# Patient Record
Sex: Female | Born: 1945 | Race: White | Hispanic: No | Marital: Single | Smoking: Never smoker
Health system: Southern US, Community
[De-identification: ages and names within clinical notes are randomized; demographics above are authoritative.]

## PROBLEM LIST (undated history)

## (undated) DIAGNOSIS — F329 Major depressive disorder, single episode, unspecified: Secondary | ICD-10-CM

## (undated) DIAGNOSIS — I10 Essential (primary) hypertension: Secondary | ICD-10-CM

## (undated) DIAGNOSIS — F32A Depression, unspecified: Secondary | ICD-10-CM

## (undated) DIAGNOSIS — I639 Cerebral infarction, unspecified: Secondary | ICD-10-CM

## (undated) DIAGNOSIS — E119 Type 2 diabetes mellitus without complications: Secondary | ICD-10-CM

## (undated) HISTORY — DX: Major depressive disorder, single episode, unspecified: F32.9

## (undated) HISTORY — DX: Essential (primary) hypertension: I10

## (undated) HISTORY — PX: UMBILICAL HERNIA REPAIR: SHX196

## (undated) HISTORY — DX: Type 2 diabetes mellitus without complications: E11.9

## (undated) HISTORY — PX: HERNIA REPAIR: SHX51

## (undated) HISTORY — DX: Cerebral infarction, unspecified: I63.9

## (undated) HISTORY — DX: Depression, unspecified: F32.A

---

## 2001-02-26 ENCOUNTER — Encounter: Admission: RE | Admit: 2001-02-26 | Discharge: 2001-02-26 | Payer: Self-pay | Admitting: Family Medicine

## 2001-03-09 ENCOUNTER — Encounter: Admission: RE | Admit: 2001-03-09 | Discharge: 2001-03-09 | Payer: Self-pay | Admitting: Family Medicine

## 2001-03-12 ENCOUNTER — Encounter: Admission: RE | Admit: 2001-03-12 | Discharge: 2001-03-12 | Payer: Self-pay | Admitting: Family Medicine

## 2001-04-10 ENCOUNTER — Encounter: Admission: RE | Admit: 2001-04-10 | Discharge: 2001-04-10 | Payer: Self-pay | Admitting: Family Medicine

## 2001-05-01 ENCOUNTER — Encounter: Admission: RE | Admit: 2001-05-01 | Discharge: 2001-07-30 | Payer: Self-pay | Admitting: *Deleted

## 2001-07-27 ENCOUNTER — Encounter: Admission: RE | Admit: 2001-07-27 | Discharge: 2001-07-27 | Payer: Self-pay | Admitting: Family Medicine

## 2001-08-20 ENCOUNTER — Encounter: Admission: RE | Admit: 2001-08-20 | Discharge: 2001-08-20 | Payer: Self-pay | Admitting: Family Medicine

## 2001-11-28 ENCOUNTER — Encounter: Admission: RE | Admit: 2001-11-28 | Discharge: 2001-11-28 | Payer: Self-pay | Admitting: Family Medicine

## 2002-01-18 ENCOUNTER — Encounter: Admission: RE | Admit: 2002-01-18 | Discharge: 2002-01-18 | Payer: Self-pay | Admitting: Family Medicine

## 2002-02-13 ENCOUNTER — Encounter: Admission: RE | Admit: 2002-02-13 | Discharge: 2002-02-13 | Payer: Self-pay | Admitting: Family Medicine

## 2002-06-13 ENCOUNTER — Encounter: Admission: RE | Admit: 2002-06-13 | Discharge: 2002-06-13 | Payer: Self-pay | Admitting: Family Medicine

## 2002-06-23 ENCOUNTER — Encounter (INDEPENDENT_AMBULATORY_CARE_PROVIDER_SITE_OTHER): Payer: Self-pay | Admitting: *Deleted

## 2003-01-20 ENCOUNTER — Encounter: Admission: RE | Admit: 2003-01-20 | Discharge: 2003-01-20 | Payer: Self-pay | Admitting: Sports Medicine

## 2003-12-24 ENCOUNTER — Emergency Department (HOSPITAL_COMMUNITY): Admission: EM | Admit: 2003-12-24 | Discharge: 2003-12-24 | Payer: Self-pay | Admitting: Family Medicine

## 2003-12-31 ENCOUNTER — Encounter: Admission: RE | Admit: 2003-12-31 | Discharge: 2003-12-31 | Payer: Self-pay | Admitting: Family Medicine

## 2004-01-12 ENCOUNTER — Encounter: Admission: RE | Admit: 2004-01-12 | Discharge: 2004-01-12 | Payer: Self-pay | Admitting: Family Medicine

## 2005-05-02 ENCOUNTER — Ambulatory Visit: Payer: Self-pay

## 2005-08-22 ENCOUNTER — Ambulatory Visit: Payer: Self-pay | Admitting: Sports Medicine

## 2006-07-20 DIAGNOSIS — I10 Essential (primary) hypertension: Secondary | ICD-10-CM

## 2006-07-20 DIAGNOSIS — N951 Menopausal and female climacteric states: Secondary | ICD-10-CM

## 2006-07-20 DIAGNOSIS — K649 Unspecified hemorrhoids: Secondary | ICD-10-CM | POA: Insufficient documentation

## 2006-07-20 DIAGNOSIS — M1990 Unspecified osteoarthritis, unspecified site: Secondary | ICD-10-CM | POA: Insufficient documentation

## 2006-07-20 DIAGNOSIS — N393 Stress incontinence (female) (male): Secondary | ICD-10-CM | POA: Insufficient documentation

## 2006-07-20 DIAGNOSIS — E119 Type 2 diabetes mellitus without complications: Secondary | ICD-10-CM

## 2006-07-20 DIAGNOSIS — K5900 Constipation, unspecified: Secondary | ICD-10-CM | POA: Insufficient documentation

## 2006-07-20 DIAGNOSIS — E7800 Pure hypercholesterolemia, unspecified: Secondary | ICD-10-CM

## 2006-07-20 DIAGNOSIS — E669 Obesity, unspecified: Secondary | ICD-10-CM

## 2006-07-21 ENCOUNTER — Encounter (INDEPENDENT_AMBULATORY_CARE_PROVIDER_SITE_OTHER): Payer: Self-pay | Admitting: *Deleted

## 2011-01-06 ENCOUNTER — Inpatient Hospital Stay (HOSPITAL_COMMUNITY)
Admission: EM | Admit: 2011-01-06 | Discharge: 2011-01-13 | DRG: 064 | Disposition: A | Discharge status: Skilled Nursing Facility | Payer: Medicare Other | Attending: Neurology | Admitting: Neurology

## 2011-01-06 ENCOUNTER — Emergency Department (HOSPITAL_COMMUNITY): Payer: Medicare Other

## 2011-01-06 DIAGNOSIS — J189 Pneumonia, unspecified organism: Secondary | ICD-10-CM | POA: Diagnosis present

## 2011-01-06 DIAGNOSIS — Z6831 Body mass index (BMI) 31.0-31.9, adult: Secondary | ICD-10-CM

## 2011-01-06 DIAGNOSIS — R29810 Facial weakness: Secondary | ICD-10-CM | POA: Diagnosis present

## 2011-01-06 DIAGNOSIS — E669 Obesity, unspecified: Secondary | ICD-10-CM | POA: Diagnosis present

## 2011-01-06 DIAGNOSIS — N390 Urinary tract infection, site not specified: Secondary | ICD-10-CM | POA: Diagnosis present

## 2011-01-06 DIAGNOSIS — I6350 Cerebral infarction due to unspecified occlusion or stenosis of unspecified cerebral artery: Principal | ICD-10-CM | POA: Diagnosis present

## 2011-01-06 DIAGNOSIS — F4321 Adjustment disorder with depressed mood: Secondary | ICD-10-CM | POA: Diagnosis present

## 2011-01-06 DIAGNOSIS — Z79899 Other long term (current) drug therapy: Secondary | ICD-10-CM

## 2011-01-06 DIAGNOSIS — Z7982 Long term (current) use of aspirin: Secondary | ICD-10-CM

## 2011-01-06 DIAGNOSIS — I10 Essential (primary) hypertension: Secondary | ICD-10-CM | POA: Diagnosis present

## 2011-01-06 DIAGNOSIS — R471 Dysarthria and anarthria: Secondary | ICD-10-CM | POA: Diagnosis present

## 2011-01-06 DIAGNOSIS — E785 Hyperlipidemia, unspecified: Secondary | ICD-10-CM | POA: Diagnosis present

## 2011-01-06 DIAGNOSIS — E1149 Type 2 diabetes mellitus with other diabetic neurological complication: Secondary | ICD-10-CM | POA: Diagnosis present

## 2011-01-06 DIAGNOSIS — Z794 Long term (current) use of insulin: Secondary | ICD-10-CM

## 2011-01-06 DIAGNOSIS — B372 Candidiasis of skin and nail: Secondary | ICD-10-CM | POA: Diagnosis present

## 2011-01-06 DIAGNOSIS — E1142 Type 2 diabetes mellitus with diabetic polyneuropathy: Secondary | ICD-10-CM | POA: Diagnosis present

## 2011-01-06 DIAGNOSIS — Z880 Allergy status to penicillin: Secondary | ICD-10-CM

## 2011-01-06 LAB — POCT I-STAT TROPONIN I: Troponin i, poc: 0.01 ng/mL (ref 0.00–0.08)

## 2011-01-07 ENCOUNTER — Inpatient Hospital Stay (HOSPITAL_COMMUNITY): Payer: Medicare Other

## 2011-01-07 DIAGNOSIS — I517 Cardiomegaly: Secondary | ICD-10-CM

## 2011-01-07 LAB — CBC
HCT: 44.3 % (ref 36.0–46.0)
Hemoglobin: 16.2 g/dL — ABNORMAL HIGH (ref 12.0–15.0)
MCH: 30.7 pg (ref 26.0–34.0)
MCHC: 36.6 g/dL — ABNORMAL HIGH (ref 30.0–36.0)
MCV: 84.1 fL (ref 78.0–100.0)
Platelets: 228 10*3/uL (ref 150–400)
RBC: 5.27 MIL/uL — ABNORMAL HIGH (ref 3.87–5.11)
RDW: 12.2 % (ref 11.5–15.5)
WBC: 11 K/uL — ABNORMAL HIGH (ref 4.0–10.5)

## 2011-01-07 LAB — URINALYSIS, ROUTINE W REFLEX MICROSCOPIC
Bilirubin Urine: NEGATIVE
Glucose, UA: NEGATIVE mg/dL
Hgb urine dipstick: NEGATIVE
Ketones, ur: 15 mg/dL — AB
Nitrite: NEGATIVE
Protein, ur: NEGATIVE mg/dL
Specific Gravity, Urine: 1.009 (ref 1.005–1.030)
Urobilinogen, UA: 1 mg/dL (ref 0.0–1.0)
pH: 6.5 (ref 5.0–8.0)

## 2011-01-07 LAB — DIFFERENTIAL
Basophils Absolute: 0 K/uL (ref 0.0–0.1)
Basophils Relative: 0 % (ref 0–1)
Eosinophils Absolute: 0.1 K/uL (ref 0.0–0.7)
Eosinophils Relative: 1 % (ref 0–5)
Lymphocytes Relative: 12 % (ref 12–46)
Lymphs Abs: 1.3 10*3/uL (ref 0.7–4.0)
Monocytes Absolute: 0.6 K/uL (ref 0.1–1.0)
Monocytes Relative: 6 % (ref 3–12)
Neutro Abs: 8.9 10*3/uL — ABNORMAL HIGH (ref 1.7–7.7)
Neutrophils Relative %: 81 % — ABNORMAL HIGH (ref 43–77)

## 2011-01-07 LAB — CK TOTAL AND CKMB (NOT AT ARMC)
CK, MB: 2.3 ng/mL (ref 0.3–4.0)
Relative Index: INVALID (ref 0.0–2.5)
Total CK: 39 U/L (ref 7–177)

## 2011-01-07 LAB — COMPREHENSIVE METABOLIC PANEL
ALT: 10 U/L (ref 0–35)
AST: 12 U/L (ref 0–37)
Albumin: 4.1 g/dL (ref 3.5–5.2)
Alkaline Phosphatase: 131 U/L — ABNORMAL HIGH (ref 39–117)
GFR calc Af Amer: >60 mL/min (ref >60)
Glucose, Bld: 217 mg/dL — ABNORMAL HIGH (ref 70–99)
Sodium: 137 mEq/L (ref 135–145)

## 2011-01-07 LAB — TROPONIN I: Troponin I: <0.3 ng/mL (ref <0.30)

## 2011-01-07 LAB — HEMOGLOBIN A1C
Hgb A1c MFr Bld: 8 % — ABNORMAL HIGH (ref <5.7)
Mean Plasma Glucose: 183 mg/dL — ABNORMAL HIGH (ref <117)

## 2011-01-07 LAB — COMPREHENSIVE METABOLIC PANEL WITH GFR
BUN: 11 mg/dL (ref 6–23)
CO2: 25 meq/L (ref 19–32)
Calcium: 10 mg/dL (ref 8.4–10.5)
Chloride: 100 meq/L (ref 96–112)
Creatinine, Ser: 0.51 mg/dL (ref 0.50–1.10)
GFR calc non Af Amer: >60 mL/min (ref >60)
Potassium: 3.5 meq/L (ref 3.5–5.1)
Total Bilirubin: 0.8 mg/dL (ref 0.3–1.2)
Total Protein: 7.4 g/dL (ref 6.0–8.3)

## 2011-01-07 LAB — LIPID PANEL
Cholesterol: 204 mg/dL — ABNORMAL HIGH (ref 0–200)
HDL: 44 mg/dL (ref >39)
LDL Cholesterol: 140 mg/dL — ABNORMAL HIGH (ref 0–99)
Total CHOL/HDL Ratio: 4.6 ratio
Triglycerides: 98 mg/dL (ref <150)
VLDL: 20 mg/dL (ref 0–40)

## 2011-01-07 LAB — URINE MICROSCOPIC-ADD ON

## 2011-01-07 LAB — PROTIME-INR
INR: 0.99 (ref 0.00–1.49)
Prothrombin Time: 13.3 seconds (ref 11.6–15.2)

## 2011-01-07 LAB — APTT: aPTT: 31 s (ref 24–37)

## 2011-01-07 LAB — ETHANOL: Alcohol, Ethyl (B): <11 mg/dL (ref 0–11)

## 2011-01-07 LAB — GLUCOSE, CAPILLARY: Glucose-Capillary: 245 mg/dL — ABNORMAL HIGH (ref 70–99)

## 2011-01-07 MED ORDER — GADOBENATE DIMEGLUMINE 529 MG/ML IV SOLN
15.0000 mL | Freq: Once | INTRAVENOUS | Status: AC
  Administered 2011-01-07: 15 mL via INTRAVENOUS

## 2011-01-08 LAB — CBC
HCT: 40.5 % (ref 36.0–46.0)
Hemoglobin: 14.3 g/dL (ref 12.0–15.0)
MCH: 30.2 pg (ref 26.0–34.0)
MCHC: 35.3 g/dL (ref 30.0–36.0)
MCV: 85.6 fL (ref 78.0–100.0)

## 2011-01-08 LAB — GLUCOSE, CAPILLARY
Glucose-Capillary: 200 mg/dL — ABNORMAL HIGH (ref 70–99)
Glucose-Capillary: 216 mg/dL — ABNORMAL HIGH (ref 70–99)
Glucose-Capillary: 164 mg/dL — ABNORMAL HIGH (ref 70–99)
Glucose-Capillary: 201 mg/dL — ABNORMAL HIGH (ref 70–99)

## 2011-01-08 LAB — COMPREHENSIVE METABOLIC PANEL
CO2: 26 mEq/L (ref 19–32)
Calcium: 9.3 mg/dL (ref 8.4–10.5)
Creatinine, Ser: 0.56 mg/dL (ref 0.50–1.10)
GFR calc Af Amer: >60 mL/min (ref >60)
GFR calc non Af Amer: >60 mL/min (ref >60)
Glucose, Bld: 188 mg/dL — ABNORMAL HIGH (ref 70–99)

## 2011-01-08 LAB — TSH: TSH: 2.627 u[IU]/mL (ref 0.350–4.500)

## 2011-01-09 ENCOUNTER — Inpatient Hospital Stay (HOSPITAL_COMMUNITY): Payer: Medicare Other

## 2011-01-09 LAB — GLUCOSE, CAPILLARY
Glucose-Capillary: 206 mg/dL — ABNORMAL HIGH (ref 70–99)
Glucose-Capillary: 137 mg/dL — ABNORMAL HIGH (ref 70–99)
Glucose-Capillary: 192 mg/dL — ABNORMAL HIGH (ref 70–99)
Glucose-Capillary: 126 mg/dL — ABNORMAL HIGH (ref 70–99)

## 2011-01-09 NOTE — Consult Note (Signed)
NAME:  Annette Farmer, Annette Farmer NO.:  [id number]  MEDICAL RECORD NO.:  [id number]  LOCATION:  3731                         FACILITY:  [facility]  PHYSICIAN:  Conley Canal, MD      DATE OF BIRTH:  [birth year]  DATE OF CONSULTATION: DATE OF DISCHARGE:                                CONSULTATION   REQUESTING PHYSICIAN:  Pramod P. Pearlean Brownie, MD  PRIMARY CARE PHYSICIAN:  None.  REASON FOR CONSULTATION:  Management of medical problems including diabetes mellitus, hypertension, hyperlipidemia, possible urinary tract infection.  HISTORY OF PRESENT ILLNESS:  I was asked by Dr. 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  The patient was also found to have diabetes with a hemoglobin A1c of 8 and very poorly-controlled blood pressure as well as hyperlipidemia with total cholesterol of 204 and LDL 140.  The patient has not seen a primary care physician in many years.  She attributes it to problems with insurance.  She thus has not been taking any medication.  Her blood pressures have been ranging from 142-176 systolic since admission.  She admits to urinary frequency and a chest x-ray on January 07, 2011, suggested density of the left hemidiaphragm, which could possibly be due to retro diaphragmatic air space opacity.  She denies any other complaints.  No fever, no shortness of breath, no abdominal pain, no polyuria.  PAST MEDICAL HISTORY:  Hypertension.  ALLERGIES:  Thinks she has allergy to PENICILLIN.  SOCIAL HISTORY:  The patient lives by herself.  Denies cigarette smoking.  Occasionally drinks alcohol and denies illicit drugs.  FAMILY HISTORY:  The patient denies history of chronic medical conditions.  CURRENT MEDICATIONS:  Aspirin, clonidine, Lovenox, labetalol, normal saline, Zofran, Senokot, Ambien.  REVIEW OF SYSTEMS:  Unremarkable  except as highlighted in the history of present illness.  PHYSICAL EXAMINATION:  GENERAL:  This is a well looking lady who is not in acute distress. VITALS:  Blood pressure 163/94, heart rate is 77, temperature 97.7, respirations 18, O2 saturation 97% on room air. HEAD, EARS, NOSE, AND THROAT:  Pupils equal, reacting to light. NECK:  No jugular venous distention.  No carotid bruits. RESPIRATORY:  Good air entry bilaterally with some scant basilar rhonchi on the left side. CARDIOVASCULAR:  First and second heart sounds are heard.  No murmurs. Pulse regular. ABDOMEN:  Scaphoid, soft, nontender.  No palpable organomegaly.  Bowel sounds are normal. CNS:  The patient is alert and oriented to person, place, and time.  No overt acute neurological deficits. EXTREMITIES:  No pedal edema.  Peripheral pulses equal.  LABORATORY DATA:  Labs were reviewed.  Significant for WBC 11, hemoglobin 16, hematocrit 44, platelet count 228, neutrophils 81%. Sodium 137, potassium 3.5, BUN 11, creatinine 0.51, alkaline phosphatase 131.  Urinalysis shows negative nitrites and some trace leukocytes. Microscopy shows WBC 3 to 6 with few bacteria.  Cardiac enzymes negative x1.  Lipid panel shows total cholesterol 204, HDL 44, LDL 140, hemoglobin A1c 8.  Chest x-ray and MRI findings discussed above.  EKG shows normal sinus rhythm  with some QT prolongation, no significant ST-T wave changes.  2-D echocardiogram shows EF 55% to 60% with grade 1 diastolic dysfunction.  IMPRESSION:  A 65 year old female who has type 2 diabetes mellitus, hypertension, hyperlipidemia, possibly early urinary tract infection, and some atelectasis versus pneumonia on the left lower lobe who is coming in with a nonhemorrhagic cerebrovascular accident.  RECOMMENDATIONS: 1. Diabetes mellitus type 2.  Since hemoglobin A1c less than 10, we     will place the patient on metformin and sliding scale insulin while     in-house.  Change diet to  diabetic, heart healthy diet. 2. Hypertension.  We would discontinue clonidine patch to give room     for adjustment of other medications.  We will start the patient on     Lopressor as well as Norvasc and adjust accordingly.  Continue     labetalol p.r.n. 3. Hyperlipidemia.  We will start the patient on Zocor. 4. UTI, possible left lower lobe pneumonia.  We will cover with     Levaquin for 7 days. 5. CVA, defer management to Neurology. 6. The patient will need long-term followup with primary care for     health care maintenance issues including things like mammogram and     colonoscopy.  I discussed this with the patient who says that she     has to have Medicare approved, but she says that she applied for     Medicare apparently late for this year.  She may need social work     referral for help with sorting out her Medicare.  Thank you very much for this consult.  We will follow with you during the course of this hospitalization.     Conley Canal, MD     SR/MEDQ  D:  01/07/2011  T:  01/07/2011  Job:  [id number]  cc:   Pramod P. Pearlean Brownie, MD  Electronically Signed by Conley Canal  on 01/09/2011 02:25:08 PM

## 2011-01-10 ENCOUNTER — Inpatient Hospital Stay (HOSPITAL_COMMUNITY): Payer: Medicare Other

## 2011-01-10 DIAGNOSIS — G8110 Spastic hemiplegia affecting unspecified side: Secondary | ICD-10-CM

## 2011-01-10 LAB — URINE CULTURE: Culture  Setup Time: 201208172147

## 2011-01-10 LAB — BASIC METABOLIC PANEL
BUN: 16 mg/dL (ref 6–23)
Chloride: 104 mEq/L (ref 96–112)
GFR calc Af Amer: >60 mL/min (ref >60)
Potassium: 3.4 mEq/L — ABNORMAL LOW (ref 3.5–5.1)
Sodium: 137 mEq/L (ref 135–145)

## 2011-01-10 LAB — GLUCOSE, CAPILLARY
Glucose-Capillary: 147 mg/dL — ABNORMAL HIGH (ref 70–99)
Glucose-Capillary: 129 mg/dL — ABNORMAL HIGH (ref 70–99)
Glucose-Capillary: 141 mg/dL — ABNORMAL HIGH (ref 70–99)
Glucose-Capillary: 200 mg/dL — ABNORMAL HIGH (ref 70–99)

## 2011-01-11 ENCOUNTER — Inpatient Hospital Stay (HOSPITAL_COMMUNITY): Payer: Medicare Other

## 2011-01-11 LAB — BASIC METABOLIC PANEL
CO2: 26 mEq/L (ref 19–32)
Chloride: 104 mEq/L (ref 96–112)
GFR calc Af Amer: >60 mL/min (ref >60)
Potassium: 3.8 mEq/L (ref 3.5–5.1)
Sodium: 138 mEq/L (ref 135–145)

## 2011-01-11 LAB — GLUCOSE, CAPILLARY
Glucose-Capillary: 169 mg/dL — ABNORMAL HIGH (ref 70–99)
Glucose-Capillary: 196 mg/dL — ABNORMAL HIGH (ref 70–99)
Glucose-Capillary: 232 mg/dL — ABNORMAL HIGH (ref 70–99)
Glucose-Capillary: 108 mg/dL — ABNORMAL HIGH (ref 70–99)

## 2011-01-12 LAB — GLUCOSE, CAPILLARY
Glucose-Capillary: 126 mg/dL — ABNORMAL HIGH (ref 70–99)
Glucose-Capillary: 229 mg/dL — ABNORMAL HIGH (ref 70–99)
Glucose-Capillary: 100 mg/dL — ABNORMAL HIGH (ref 70–99)
Glucose-Capillary: 177 mg/dL — ABNORMAL HIGH (ref 70–99)

## 2011-01-12 LAB — BASIC METABOLIC PANEL
BUN: 17 mg/dL (ref 6–23)
CO2: 29 mEq/L (ref 19–32)
Glucose, Bld: 141 mg/dL — ABNORMAL HIGH (ref 70–99)
Potassium: 3.5 mEq/L (ref 3.5–5.1)
Sodium: 137 mEq/L (ref 135–145)

## 2011-01-13 LAB — GLUCOSE, CAPILLARY
Glucose-Capillary: 167 mg/dL — ABNORMAL HIGH (ref 70–99)
Glucose-Capillary: 142 mg/dL — ABNORMAL HIGH (ref 70–99)
Glucose-Capillary: 174 mg/dL — ABNORMAL HIGH (ref 70–99)

## 2011-01-13 NOTE — H&P (Signed)
NAME:  Annette Farmer, Annette Farmer NO.:  [id number]  MEDICAL RECORD NO.:  [id number]  LOCATION:  3731                         FACILITY:  [facility]  PHYSICIAN:  Marlan Palau, M.D.  DATE OF BIRTH:  1945-12-27  DATE OF ADMISSION:  01/06/2011 DATE OF DISCHARGE:                             HISTORY & PHYSICAL   HISTORY OF PRESENT ILLNESS:  Annette Farmer is a 66 year old right-handed white female born [birth year], with a history of hypertension. This patient has no primary care physician and has not been treated at all for her blood pressure issues.  The patient is not on any medications prior to coming in including low-dose aspirin.  This patient went to bed in the evening of January 05, 2011 and awakened around 12 noon on January 06, 2011.  The patient noted that her speech was somewhat slurred when she talked on the telephone.  The patient had no numbness or weakness on the arms or legs.  No gait disturbance or visual field changes.  The patient did report some mild headache.  The patient continued to note some slurred speech and decided to call the EMS this evening.  The patient was brought in as a code stroke which was promptly cancelled given the last seen normal was the day prior.  The CT of the head shows no definite acute changes.  The patient does have some right facial droop and some mild dysarthria.  NIH stroke scale score is 2. The patient is not a TPA candidate secondary to duration of symptoms. The patient is being admitted for evaluation.  Blood pressures are in the 205/110 range.  PAST MEDICAL HISTORY: 1. New onset of left brain subcortical stroke with mild dysarthria and     right facial droop. 2. Tonsillectomy. 3. Umbilical hernia repair. 4. History of fissure in a new repair.  The patient is on no medications.  The patient believes that she is allergic to PENICILLIN because of some nausea.  The patient does not smoke, but drinks alcohol on  occasion.  SOCIAL HISTORY:  The patient is single, never been married, no children, is retired, lives in the [place] area.  FAMILY MEDICAL HISTORY:  Notable that mother died following hernia surgery "never woke up."  Father still living and is in good health. The patient has 1 brother who is in good health, but 1 sister died with breast cancer.  REVIEW OF SYSTEMS:  Notable for no recent fevers or chills.  The patient does note headache.  Denies neck pain, back pain.  Denies shortness of breath, chest pains, abdominal pain and nausea or vomiting.  The patient does have some issues with urinary incontinence, has not had no dizziness or blackouts.  PHYSICAL EXAMINATION:  VITALS:  Blood pressure is 206/126, heart rate 99 and respiratory rate 18.  The patient is afebrile. GENERAL:  This patient is a fairly well developed elderly female who is alert and cooperative at the time of examination. HEENT:  Head is atraumatic.  Eyes, pupils are equal, round and react to light. NECK:  Supple.  No carotid bruits.  No respiratory examination is clear. CARDIOVASCULAR:  Regular rate and rhythm.  No obvious  murmurs or rubs noted. EXTREMITIES:  Without significant edema. NEUROLOGIC:  Cranial nerves as above.  Facial symmetry is not present. The patient has mild right facial droop.  Extraocular movements are full.  Visual fields are full.  Speech is minimally dysarthric.  No aphasia noted.  Motor testing reveals 5/5 strength in all 4, good symmetric motor is noted throughout.  No drift is seen on the arms or legs.  The patient has good finger-nose-finger heel-to-shin.  Gait was not tested.  The patient has good symmetric reflexes.  Toes are neutral bilaterally.  Pinprick is soft to touch.  Vibratory sensation is intact throughout.  LABORATORY VALUES:  Notable for white count 11.0, hemoglobin of 16.2, hematocrit of 44.3, MCV of 84.1, platelets of 228, INR of 0.99, troponin I of 0.01.  Chemistry  panel is pending.  IMPRESSION: 1. Uncontrolled hypertension. 2. Subcortical left brain stroke.  The patient has not received any     medical care for a number of years.  The patient has untreated     hypertension and has severe hypertension currently.  The patient     will be admitted for further evaluation.  PLAN: 1. Labetalol for blood pressure.  We add Catapres patch. 2. MRI of the brain. 3. MRI angiogram of the head and neck. 4. An 2-D echocardiogram. 5. Aspirin therapy.  We will follow patient's clinical course while in-     house.     Marlan Palau, M.D.     CKW/MEDQ  D:  01/07/2011  T:  01/07/2011  Job:  [id number]  Electronically Signed by Thana Farr M.D. on 01/13/2011 09:25:16 AM

## 2011-01-19 NOTE — Discharge Summary (Signed)
NAME:  Annette Farmer, Annette Farmer               ACCOUNT NO.:  [id number]  MEDICAL RECORD NO.:  [id number]  LOCATION:  3023                         FACILITY:  [facility]  PHYSICIAN:  Pramod P. Pearlean Brownie, MD    DATE OF BIRTH:  02-Jun-1945  DATE OF ADMISSION:  01/06/2011 DATE OF DISCHARGE:  01/13/2011                        DISCHARGE SUMMARY - REFERRING   DIAGNOSIS AT TIME OF DISCHARGE: 1. 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 2. Hypertension. 3. Diabetes, new diagnosis. 4. Fungal yeast infection in abdominal folds. 5. Mild obesity with BMI of 31.9. 6. Hyperlipidemia. 7. Urinary tract infection. 8. Pneumonia, treated with antibiotics. 9. Neuropathy secondary to diabetes. 10.Situational depression. 11.Urinary tract infection present on admission, treated with     antibiotics. 12.Right hemeparesis.  MEDICINES AT TIME OF DISCHARGE: 1. Amlodipine 5 mg daily. 2. Aspirin 325 mg daily. 3. Celexa 10 mg daily. 4. Enablex 7.5 mg at bedtime. 5. Gabapentin 300 mg at bedtime. 6. Lantus 20 units at bedtime. 7. Metformin 500 mg b.i.d. with meals. 8. Metoprolol 25 mg b.i.d. 9. Nystatin topically b.i.d. to abdominal folds until healed. 10.Zocor 20 mg daily.  STUDIES PERFORMED: 1. CT of the brain on admission shows acute deep white matter infarct     in the left centrum semiovale.   2. MRI of the brain shows deep white     matter infarct affecting the centrum semiovale. 3. MRA of the brain shows no significant carotid or basilar disease.     No proximal flow reducing lesion. 4. MRA of the neck shows no significant extracranial atherosclerotic     changes. 5. Chest x-ray shows no acute cardiopulmonary process. 6. MRI, follow up shows interval enlargement of nonhemorrhagic infarct     involving the left corona radiata. 7. 2-D echo shows EF of 55%-60% with no obvious source of embolus.     Carotid Doppler shows no extracranial artery stenosis demonstrated. 8. EKG not present in chart  though telemetry strip shows normal sinus     rhythm.  LABORATORY STUDIES:  Chemistry with glucose 141, otherwise normal. Urine culture shows greater than 100,000 colonies of E coli.  TSH 2.627. CBC normal.  Hemoglobin A1c 8.0.  Cholesterol 204, triglycerides 98, HDL 44, LDL 140, troponin I less than 0.30.  Urinalysis with few squamous epithelials, 0-6 white blood cells, 0-2 red blood cells and few bacteria.  Alcohol level less than 11.  HISTORY OF PRESENT ILLNESS:  Annette Farmer is a 65 year old right- handed Caucasian female with history of hypertension.  The patient has no primary care physician and has not been treated for her blood pressure issues.  The patient is not on any medicines prior to coming in.  The patient went to bed evening of January 05, 2011 and awakened around 12 noon on January 06, 2011.  Upon awakening, the patient noted her speech was somewhat slurred when she talked on the telephone.  She had no numbness or weakness in arms and legs.  No gait disturbance or visual field changes.  She did report some mild headache.  The patient decide to call EMS later in the evening after symptoms continued.  The patient was brought in as a code stroke, which was  probably cancelled given that last seen normal was greater than 8 hours previously.  The CT of the brain showed no definite acute changes.  The patient had some right facial droop and mild dysarthria.  NIH stroke scale was two.  The patient was not a TPA candidate secondary to duration of symptoms.  The patient was admitted for further evaluation.  Blood pressure on admission was 205/110.  HOSPITAL COURSE:  MRI confirms stroke in the left centrum semiovale. Infarct was subcortical and felt to be secondary to small-vessel disease.  The patient does have a history of hypertension and was hypertensive on admission.  The patient also had elevated lipids, mild obesity and a hemoglobin A1c of 8.0 consistent with new  onset of diabetes.  The patient was started on amlodipine and metoprolol for her blood pressure as well as Lantus and metformin for her diabetes. Zocor was added for dyslipidemia.  Enablex and Neurontin were also been added for neuropathies related to her diabetes.  In hospital, the patient was evaluated by PT, OT and speech therapy. They recommended home health follow up.  As the patient lived alone prior to admission and has no family support, decision was made that she was unsafe to return home alone at this time.  We will plan short-term skilled nursing home facility placement for ongoing rehab with eventual return home.  Due to the situation, the patient was depressed.  She refused to eat for approximately 24 hours.  The patient was started on Celexa for situational depression.  SNF bed obtained at [facility].  We will discharge there for continuation of care.  The patient had red rash between abdominal folds.  Started on nystatin for Candida treatment.  CONDITION AT TIME OF DISCHARGE:  The patient alert and oriented to person, place and situation.  She has no aphasia.  No dysarthria.  Her eye movements are intact.  Her visual fields are full.  She has right lower facial weakness.  Right upper extremity is 1/5, right lower extremity at 4/5.  Ankle, her right ankle has foot drop.  Strength in left is normal.  DISCHARGE PLAN: 1. Discharged to [facility] for ongoing PT,     OT and speech therapy. 2. Ongoing risk factor control by primary care physician/skilled     nursing home physician. 3. Follow up primary care physician within 1 month. 4. Follow up with Dr. Pearlean Brownie in his office in 2 months. 5. Ongoing diabetes education with follow up nutrition management     recommended.     Annie Main, N.P.   ______________________________ Sunny Schlein. Pearlean Brownie, MD    SB/MEDQ  D:  01/13/2011  T:  01/13/2011  Job:   [id number]  Electronically Signed by Annie Main N.P. on 01/19/2011 02:04:32 PM Electronically Signed by Delia Heady MD on 01/19/2011 09:38:16 PM

## 2011-03-10 ENCOUNTER — Emergency Department (HOSPITAL_COMMUNITY)
Admission: EM | Admit: 2011-03-10 | Discharge: 2011-03-10 | Disposition: A | Discharge status: Home / Self Care | Payer: Self-pay | Attending: Emergency Medicine | Admitting: Emergency Medicine

## 2011-03-10 DIAGNOSIS — K612 Anorectal abscess: Secondary | ICD-10-CM | POA: Insufficient documentation

## 2011-03-10 DIAGNOSIS — Z8673 Personal history of transient ischemic attack (TIA), and cerebral infarction without residual deficits: Secondary | ICD-10-CM | POA: Insufficient documentation

## 2011-03-10 DIAGNOSIS — Z7982 Long term (current) use of aspirin: Secondary | ICD-10-CM | POA: Insufficient documentation

## 2011-03-10 DIAGNOSIS — K6289 Other specified diseases of anus and rectum: Secondary | ICD-10-CM | POA: Insufficient documentation

## 2011-03-10 DIAGNOSIS — Z79899 Other long term (current) drug therapy: Secondary | ICD-10-CM | POA: Insufficient documentation

## 2011-03-10 DIAGNOSIS — I10 Essential (primary) hypertension: Secondary | ICD-10-CM | POA: Insufficient documentation

## 2011-03-10 LAB — CBC
HCT: 40 % (ref 36.0–46.0)
MCHC: 35.3 g/dL (ref 30.0–36.0)
Platelets: 223 10*3/uL (ref 150–400)
RDW: 12.8 % (ref 11.5–15.5)

## 2011-03-10 LAB — DIFFERENTIAL
Basophils Absolute: 0 10*3/uL (ref 0.0–0.1)
Basophils Relative: 0 % (ref 0–1)
Eosinophils Absolute: 0.1 10*3/uL (ref 0.0–0.7)
Eosinophils Relative: 1 % (ref 0–5)
Monocytes Absolute: 0.8 10*3/uL (ref 0.1–1.0)

## 2011-03-10 LAB — GLUCOSE, CAPILLARY: Glucose-Capillary: 161 mg/dL — ABNORMAL HIGH (ref 70–99)

## 2011-03-23 ENCOUNTER — Ambulatory Visit: Payer: Medicare Other | Admitting: Gynecology

## 2011-04-27 ENCOUNTER — Ambulatory Visit: Payer: Self-pay | Admitting: *Deleted

## 2011-04-27 ENCOUNTER — Ambulatory Visit: Payer: Self-pay | Attending: Family Medicine | Admitting: Physical Therapy

## 2011-04-27 DIAGNOSIS — I69998 Other sequelae following unspecified cerebrovascular disease: Secondary | ICD-10-CM | POA: Insufficient documentation

## 2011-04-27 DIAGNOSIS — M6281 Muscle weakness (generalized): Secondary | ICD-10-CM | POA: Insufficient documentation

## 2011-04-27 DIAGNOSIS — Z5189 Encounter for other specified aftercare: Secondary | ICD-10-CM | POA: Insufficient documentation

## 2011-04-27 DIAGNOSIS — R279 Unspecified lack of coordination: Secondary | ICD-10-CM | POA: Insufficient documentation

## 2011-05-03 ENCOUNTER — Ambulatory Visit: Payer: Self-pay | Admitting: Physical Therapy

## 2011-05-03 ENCOUNTER — Ambulatory Visit: Payer: Self-pay | Admitting: *Deleted

## 2011-05-10 ENCOUNTER — Ambulatory Visit: Payer: Self-pay | Admitting: Physical Therapy

## 2011-05-10 ENCOUNTER — Ambulatory Visit: Payer: Self-pay | Admitting: *Deleted

## 2011-05-23 ENCOUNTER — Ambulatory Visit: Payer: Self-pay | Admitting: Physical Therapy

## 2011-05-23 ENCOUNTER — Ambulatory Visit: Payer: Self-pay | Admitting: *Deleted

## 2011-05-31 ENCOUNTER — Ambulatory Visit: Payer: Medicare Other | Admitting: Physical Therapy

## 2011-05-31 ENCOUNTER — Ambulatory Visit: Payer: Medicare Other | Attending: Family Medicine | Admitting: Occupational Therapy

## 2011-05-31 DIAGNOSIS — I69998 Other sequelae following unspecified cerebrovascular disease: Secondary | ICD-10-CM | POA: Diagnosis not present

## 2011-05-31 DIAGNOSIS — M6281 Muscle weakness (generalized): Secondary | ICD-10-CM | POA: Insufficient documentation

## 2011-05-31 DIAGNOSIS — Z5189 Encounter for other specified aftercare: Secondary | ICD-10-CM | POA: Diagnosis not present

## 2011-05-31 DIAGNOSIS — R279 Unspecified lack of coordination: Secondary | ICD-10-CM | POA: Insufficient documentation

## 2011-06-01 ENCOUNTER — Encounter: Payer: Medicare Other | Admitting: *Deleted

## 2011-06-04 ENCOUNTER — Ambulatory Visit (INDEPENDENT_AMBULATORY_CARE_PROVIDER_SITE_OTHER): Payer: Medicare Other

## 2011-06-04 DIAGNOSIS — H8110 Benign paroxysmal vertigo, unspecified ear: Secondary | ICD-10-CM

## 2011-06-04 DIAGNOSIS — E119 Type 2 diabetes mellitus without complications: Secondary | ICD-10-CM | POA: Diagnosis not present

## 2011-06-08 ENCOUNTER — Ambulatory Visit: Payer: Medicare Other | Admitting: Physical Therapy

## 2011-06-08 ENCOUNTER — Encounter: Payer: Medicare Other | Admitting: *Deleted

## 2011-06-15 ENCOUNTER — Ambulatory Visit: Payer: Medicare Other | Admitting: Physical Therapy

## 2011-06-15 ENCOUNTER — Ambulatory Visit: Payer: Medicare Other | Admitting: *Deleted

## 2011-06-21 ENCOUNTER — Telehealth: Payer: Self-pay

## 2011-06-21 NOTE — Telephone Encounter (Signed)
Spoke with pt advised to RTC for eval. Pt understood

## 2011-06-21 NOTE — Telephone Encounter (Signed)
Please ask patient to RTC if dizziness recurs, she should be having a resolution of her symptoms.

## 2011-06-21 NOTE — Telephone Encounter (Signed)
.  Annette Farmer IS OUT OF HER MEDICINE FOR DIZZINESS AND WOULD LIKE TO KNOW IF THE DR WANTED HER TO CONTINUE TAKING IT IF HE DOES, HER PHARMACY IS [facility] [address]

## 2011-06-22 ENCOUNTER — Ambulatory Visit: Payer: Medicare Other | Admitting: Rehabilitative and Restorative Service Providers"

## 2011-06-22 ENCOUNTER — Ambulatory Visit: Payer: Medicare Other | Admitting: Physical Therapy

## 2011-06-22 ENCOUNTER — Ambulatory Visit: Payer: Medicare Other | Admitting: Occupational Therapy

## 2011-06-29 ENCOUNTER — Ambulatory Visit: Payer: Medicare Other | Attending: Family Medicine | Admitting: Occupational Therapy

## 2011-06-29 ENCOUNTER — Ambulatory Visit: Payer: Medicare Other | Admitting: Physical Therapy

## 2011-06-29 DIAGNOSIS — R279 Unspecified lack of coordination: Secondary | ICD-10-CM | POA: Diagnosis not present

## 2011-06-29 DIAGNOSIS — Z5189 Encounter for other specified aftercare: Secondary | ICD-10-CM | POA: Insufficient documentation

## 2011-06-29 DIAGNOSIS — I69998 Other sequelae following unspecified cerebrovascular disease: Secondary | ICD-10-CM | POA: Insufficient documentation

## 2011-06-29 DIAGNOSIS — M6281 Muscle weakness (generalized): Secondary | ICD-10-CM | POA: Insufficient documentation

## 2011-07-06 ENCOUNTER — Ambulatory Visit: Payer: Medicare Other | Admitting: Rehabilitative and Restorative Service Providers"

## 2011-07-06 ENCOUNTER — Ambulatory Visit: Payer: Medicare Other | Admitting: Occupational Therapy

## 2011-07-27 ENCOUNTER — Ambulatory Visit: Payer: Medicare Other | Attending: Family Medicine | Admitting: Occupational Therapy

## 2011-07-27 DIAGNOSIS — R279 Unspecified lack of coordination: Secondary | ICD-10-CM | POA: Insufficient documentation

## 2011-07-27 DIAGNOSIS — M6281 Muscle weakness (generalized): Secondary | ICD-10-CM | POA: Insufficient documentation

## 2011-07-27 DIAGNOSIS — I69998 Other sequelae following unspecified cerebrovascular disease: Secondary | ICD-10-CM | POA: Diagnosis not present

## 2011-07-27 DIAGNOSIS — Z5189 Encounter for other specified aftercare: Secondary | ICD-10-CM | POA: Diagnosis not present

## 2011-08-03 ENCOUNTER — Ambulatory Visit: Payer: Medicare Other | Admitting: Occupational Therapy

## 2011-08-03 DIAGNOSIS — Z5189 Encounter for other specified aftercare: Secondary | ICD-10-CM | POA: Diagnosis not present

## 2011-08-03 DIAGNOSIS — I69998 Other sequelae following unspecified cerebrovascular disease: Secondary | ICD-10-CM | POA: Diagnosis not present

## 2011-08-03 DIAGNOSIS — R279 Unspecified lack of coordination: Secondary | ICD-10-CM | POA: Diagnosis not present

## 2011-08-03 DIAGNOSIS — M6281 Muscle weakness (generalized): Secondary | ICD-10-CM | POA: Diagnosis not present

## 2011-08-10 ENCOUNTER — Encounter: Payer: Medicare Other | Admitting: Occupational Therapy

## 2011-08-24 DIAGNOSIS — D3130 Benign neoplasm of unspecified choroid: Secondary | ICD-10-CM | POA: Diagnosis not present

## 2011-08-24 DIAGNOSIS — E1139 Type 2 diabetes mellitus with other diabetic ophthalmic complication: Secondary | ICD-10-CM | POA: Diagnosis not present

## 2011-08-24 DIAGNOSIS — H2510 Age-related nuclear cataract, unspecified eye: Secondary | ICD-10-CM | POA: Diagnosis not present

## 2011-08-24 DIAGNOSIS — H35369 Drusen (degenerative) of macula, unspecified eye: Secondary | ICD-10-CM | POA: Diagnosis not present

## 2011-09-08 DIAGNOSIS — H2510 Age-related nuclear cataract, unspecified eye: Secondary | ICD-10-CM | POA: Diagnosis not present

## 2011-09-20 ENCOUNTER — Telehealth: Payer: Self-pay

## 2011-09-20 NOTE — Telephone Encounter (Signed)
PT STATES SHE CONTACTED PHARMACY ABOUT RX REFILLS AND THEY TOLD HER THE GENERIC FORM OF HER METFORMIN HAD ALREADY BEEN PICKED UP BUT PT STATES IT HASN'T BEEN. PT STATES THE PHARMACIST TOLD HER TO JUST LOOK AROUND AND THAT SHE PROBABLY MISPLACED IT - SHE FOUND HIM RUDE AND UNHELPFUL WHEN SHE NEEDS HER DIABETIC MEDICINE. ASKS [place] TO SEE WHAT IS GOING ON WITH THIS TO HELP HER PLEASE.   PT USES [facility] [address]   BF

## 2011-09-20 NOTE — Telephone Encounter (Signed)
PT STATES THAT SHE TRIED TO PICK UP HER RX AND [facility] TOLD HER SHE HAD ALREADY PICKED IT UP. SHE SAYS SHE HASN'T, AND SHE NEEDS HER METFORMIN. I CALLED THE PHARMACY AND THEY SAID IT WAS PICKED UP ON 4/27. DON'T KNOW WHAT TO DO AT THIS POINT. PLEASE ADVISE.

## 2011-09-21 MED ORDER — METFORMIN HCL 500 MG PO TABS: 500.0000 mg | ORAL_TABLET | Freq: Two times a day (BID) | ORAL | Status: DC

## 2011-09-21 NOTE — Telephone Encounter (Signed)
Need chart but think we should be able to just refill it.

## 2011-09-21 NOTE — Telephone Encounter (Signed)
Refill sent in

## 2011-09-21 NOTE — Telephone Encounter (Signed)
Spoke with pt and notified rx sent in, pt understood and will pick it up at her pharmacy.

## 2011-09-21 NOTE — Telephone Encounter (Signed)
Chart pulled to PA 

## 2011-09-22 ENCOUNTER — Other Ambulatory Visit: Payer: Self-pay

## 2011-09-28 DIAGNOSIS — H2510 Age-related nuclear cataract, unspecified eye: Secondary | ICD-10-CM | POA: Diagnosis not present

## 2011-09-28 DIAGNOSIS — IMO0002 Reserved for concepts with insufficient information to code with codable children: Secondary | ICD-10-CM | POA: Diagnosis not present

## 2011-12-07 DIAGNOSIS — IMO0002 Reserved for concepts with insufficient information to code with codable children: Secondary | ICD-10-CM | POA: Diagnosis not present

## 2011-12-07 DIAGNOSIS — H2510 Age-related nuclear cataract, unspecified eye: Secondary | ICD-10-CM | POA: Diagnosis not present

## 2011-12-23 ENCOUNTER — Encounter: Payer: Self-pay | Admitting: Family Medicine

## 2012-02-08 ENCOUNTER — Encounter (INDEPENDENT_AMBULATORY_CARE_PROVIDER_SITE_OTHER): Payer: Medicare Other | Admitting: Ophthalmology

## 2012-02-22 ENCOUNTER — Other Ambulatory Visit (INDEPENDENT_AMBULATORY_CARE_PROVIDER_SITE_OTHER): Payer: Medicare Other | Admitting: Ophthalmology

## 2012-02-22 DIAGNOSIS — H3581 Retinal edema: Secondary | ICD-10-CM

## 2012-02-22 DIAGNOSIS — E1165 Type 2 diabetes mellitus with hyperglycemia: Secondary | ICD-10-CM

## 2012-02-22 DIAGNOSIS — E1139 Type 2 diabetes mellitus with other diabetic ophthalmic complication: Secondary | ICD-10-CM | POA: Diagnosis not present

## 2012-03-08 ENCOUNTER — Encounter: Payer: Self-pay | Admitting: Family Medicine

## 2012-03-08 ENCOUNTER — Ambulatory Visit (INDEPENDENT_AMBULATORY_CARE_PROVIDER_SITE_OTHER): Payer: Medicare Other | Admitting: Family Medicine

## 2012-03-08 VITALS — BP 144/66 | HR 57 | Temp 98.2°F | Resp 16 | Ht 61.5 in | Wt 144.0 lb

## 2012-03-08 DIAGNOSIS — I10 Essential (primary) hypertension: Secondary | ICD-10-CM | POA: Diagnosis not present

## 2012-03-08 DIAGNOSIS — H6120 Impacted cerumen, unspecified ear: Secondary | ICD-10-CM | POA: Diagnosis not present

## 2012-03-08 DIAGNOSIS — R42 Dizziness and giddiness: Secondary | ICD-10-CM

## 2012-03-08 DIAGNOSIS — G589 Mononeuropathy, unspecified: Secondary | ICD-10-CM | POA: Diagnosis not present

## 2012-03-08 DIAGNOSIS — E119 Type 2 diabetes mellitus without complications: Secondary | ICD-10-CM

## 2012-03-08 DIAGNOSIS — G629 Polyneuropathy, unspecified: Secondary | ICD-10-CM

## 2012-03-08 LAB — CBC
HCT: 39.2 % (ref 36.0–46.0)
Hemoglobin: 13.7 g/dL (ref 12.0–15.0)
MCH: 30.4 pg (ref 26.0–34.0)
MCHC: 34.9 g/dL (ref 30.0–36.0)
MCV: 86.9 fL (ref 78.0–100.0)
Platelets: 247 10*3/uL (ref 150–400)
RBC: 4.51 MIL/uL (ref 3.87–5.11)
RDW: 13.8 % (ref 11.5–15.5)
WBC: 7.6 10*3/uL (ref 4.0–10.5)

## 2012-03-08 LAB — COMPREHENSIVE METABOLIC PANEL
ALT: <8 U/L (ref 0–35)
AST: 10 U/L (ref 0–37)
Albumin: 4.3 g/dL (ref 3.5–5.2)
Alkaline Phosphatase: 74 U/L (ref 39–117)
BUN: 19 mg/dL (ref 6–23)
CO2: 23 mEq/L (ref 19–32)
Calcium: 9.8 mg/dL (ref 8.4–10.5)
Chloride: 106 mEq/L (ref 96–112)
Creat: 0.74 mg/dL (ref 0.50–1.10)
Glucose, Bld: 116 mg/dL — ABNORMAL HIGH (ref 70–99)
Potassium: 4.2 mEq/L (ref 3.5–5.3)
Sodium: 140 mEq/L (ref 135–145)
Total Bilirubin: 0.7 mg/dL (ref 0.3–1.2)
Total Protein: 6.3 g/dL (ref 6.0–8.3)

## 2012-03-08 LAB — POCT GLYCOSYLATED HEMOGLOBIN (HGB A1C): Hemoglobin A1C: 5.3

## 2012-03-08 LAB — GLUCOSE, POCT (MANUAL RESULT ENTRY): POC Glucose: 116 mg/dl — AB (ref 70–99)

## 2012-03-08 MED ORDER — METOPROLOL TARTRATE 25 MG PO TABS: 25.0000 mg | ORAL_TABLET | Freq: Two times a day (BID) | ORAL | Status: DC

## 2012-03-08 MED ORDER — AMLODIPINE BESYLATE 5 MG PO TABS: 5.0000 mg | ORAL_TABLET | Freq: Every day | ORAL | Status: DC

## 2012-03-08 MED ORDER — GABAPENTIN 300 MG PO CAPS: 300.0000 mg | ORAL_CAPSULE | Freq: Three times a day (TID) | ORAL | Status: DC

## 2012-03-08 MED ORDER — METFORMIN HCL 500 MG PO TABS: 500.0000 mg | ORAL_TABLET | Freq: Every day | ORAL | Status: DC

## 2012-03-08 MED ORDER — CITALOPRAM HYDROBROMIDE 10 MG PO TABS: 10.0000 mg | ORAL_TABLET | Freq: Every day | ORAL | Status: DC

## 2012-03-08 NOTE — Progress Notes (Signed)
@[email or web address] @  Patient ID: SABA GOMM MRN: [id number], DOB: 1946-01-23, 66 y.o. Date of Encounter: 03/08/2012, 12:39 PM  Primary Physician: User Centricity, MD  Chief Complaint: Diabetes follow up  HPI: 66 y.o. year old female with history below presents for follow up of diabetes mellitus. Doing well. No issues or complaints. Taking medications daily without adverse effects. No polydipsia, polyphagia, polyuria, or nocturia.  Blood sugars at home:  100-110 Diet consists of: cereal, apples, cauliflower, pears, grapes, crackers. Has cat at home  Last A1C:  Over 6 months ago  Eye MD:  Dr. Lonia Skinner  (recent cataract OS and retinal hemorrhage) DDS:  Needs referral Influenza vaccine: Pneumococcal vaccine: Last CPE:   3 weeks of dizziness (vertigo), worse at night when lying down, no present today at the visit.  Mild right sided hearing loss. No past medical history on file.   Home Meds: Prior to Admission medications   Medication Sig Start Date End Date Taking? Authorizing Provider  amLODipine (NORVASC) 5 MG tablet Take 5 mg by mouth daily.   Yes Historical Provider, MD  aspirin 325 MG tablet Take 325 mg by mouth daily.   Yes Historical Provider, MD  citalopram (CELEXA) 10 MG tablet Take 10 mg by mouth daily.   Yes Historical Provider, MD  gabapentin (NEURONTIN) 300 MG capsule Take 300 mg by mouth 3 (three) times daily.   Yes Historical Provider, MD  metFORMIN (GLUCOPHAGE) 500 MG tablet Take 1 tablet (500 mg total) by mouth 2 (two) times daily with a meal. 09/21/11 09/20/12 Yes Ryan M Dunn, PA-C  metoprolol tartrate (LOPRESSOR) 25 MG tablet Take 25 mg by mouth 2 (two) times daily.   Yes Historical Provider, MD    Allergies:  Allergies  Allergen Reactions  . Penicillins Nausea Only    History   Social History  . Marital Status: Single    Spouse Name: N/A    Number of Children: N/A  . Years of Education: N/A   Occupational History  . Not on file.   Social History Main  Topics  . Smoking status: Never Smoker   . Smokeless tobacco: Not on file  . Alcohol Use: Not on file  . Drug Use: Not on file  . Sexually Active: Not on file   Other Topics Concern  . Not on file   Social History Narrative  . No narrative on file     Review of Systems: Constitutional: negative for chills, fever, night sweats, weight changes, or fatigue  HEENT: negative for vision changes, hearing loss, congestion, rhinorrhea, or epistaxis Cardiovascular: negative for chest pain, palpitations, diaphoresis, DOE, orthopnea, or edema Respiratory: negative for hemoptysis, wheezing, shortness of breath, dyspnea, or cough Abdominal: negative for abdominal pain, nausea, vomiting, diarrhea, or constipation Dermatological: negative for erythema, or wounds  Multiple insect bites on lower legs Neurologic: negative for headache, dizziness, or syncope Renal:  Negative for polyuria, polydipsia, or dysuria All other systems reviewed and are otherwise negative with the exception to those above and in the HPI.   Physical Exam: Blood pressure 144/66, pulse 57, temperature 98.2 F (36.8 C), temperature source Oral, resp. rate 16, height 5' 1.5" (1.562 m), weight 144 lb (65.318 kg), SpO2 97.00%., Body mass index is 26.77 kg/(m^2). General: Well developed, well nourished, in no acute distress. Head: Normocephalic, atraumatic, eyes without discharge, sclera non-icteric, nares are without discharge. Bilateral auditory canals clear, TM's are without perforation, pearly grey and translucent with reflective cone of light bilaterally. Oral cavity moist, posterior pharynx  without exudate, erythema, peritonsillar abscess, or post nasal drip.  Neck: Supple. No thyromegaly. Full ROM. No lymphadenopathy. Lungs: Clear bilaterally to auscultation without wheezes, rales, or rhonchi. Breathing is unlabored. Heart: RRR with S1 S2. No murmurs, rubs, or gallops appreciated. Abdomen: Soft, non-tender, non-distended with  normoactive bowel sounds. No hepatosplenomegaly. No rebound/guarding. No obvious abdominal masses. Msk:  Strength and tone normal for age. Extremities/Skin: Warm and dry. No clubbing or cyanosis. No edema. No  wounds, or suspicious lesions. Monofilament exam unremarkable bilaterally. Many bug bites both lower extremities. Neuro: Alert and oriented X 3. Moves all extremities spontaneously. Gait is normal. CNII-XII grossly in tact. Psych:  Responds to questions appropriately with a normal affect.   Right ear lavaged by Dr. Elbert Ewings:  Clean, no trauma.  Hearing improved.  ASSESSMENT AND PLAN:  66 y.o. year old female with diabetes, cerumen impaction, S/P CVA, dizziness  1. Type 2 diabetes mellitus  POCT glucose (manual entry), POCT glycosylated hemoglobin (Hb A1C), metFORMIN (GLUCOPHAGE) 500 MG tablet, Comprehensive metabolic panel, CBC  2. Hypertension  metoprolol tartrate (LOPRESSOR) 25 MG tablet, amLODipine (NORVASC) 5 MG tablet, CBC  3. Neuropathy  citalopram (CELEXA) 10 MG tablet, gabapentin (NEURONTIN) 300 MG capsule, CBC   If dizziness persists, will consider meclizine at hs only.  I am hoping the cerumen impaction was the cause.  Patient has had the dizziness before, but this is different   -  Signed, Elvina Sidle, MD 03/08/2012 12:39 PM

## 2012-03-09 ENCOUNTER — Telehealth: Payer: Self-pay

## 2012-03-09 NOTE — Telephone Encounter (Signed)
Dr.Lauenstein Pt states that she did not wake up dizzy, pt also would like to discuss getting a handicap placard.  Best# [phone]

## 2012-03-10 NOTE — Telephone Encounter (Signed)
Please let patient know that I will complete form for handicap sticker for car.  (Just drop the form with me)

## 2012-03-10 NOTE — Telephone Encounter (Signed)
Pt notified that handicapped placecard is ready in pickup drawer

## 2012-03-15 ENCOUNTER — Other Ambulatory Visit: Payer: Self-pay | Admitting: Family Medicine

## 2012-04-08 ENCOUNTER — Ambulatory Visit (INDEPENDENT_AMBULATORY_CARE_PROVIDER_SITE_OTHER): Payer: Medicare Other | Admitting: Family Medicine

## 2012-04-08 VITALS — BP 110/70 | HR 56 | Temp 97.7°F | Resp 16 | Ht 62.0 in | Wt 146.6 lb

## 2012-04-08 DIAGNOSIS — R42 Dizziness and giddiness: Secondary | ICD-10-CM

## 2012-04-08 DIAGNOSIS — I498 Other specified cardiac arrhythmias: Secondary | ICD-10-CM | POA: Diagnosis not present

## 2012-04-08 DIAGNOSIS — Z8673 Personal history of transient ischemic attack (TIA), and cerebral infarction without residual deficits: Secondary | ICD-10-CM

## 2012-04-08 DIAGNOSIS — E119 Type 2 diabetes mellitus without complications: Secondary | ICD-10-CM | POA: Diagnosis not present

## 2012-04-08 DIAGNOSIS — R531 Weakness: Secondary | ICD-10-CM

## 2012-04-08 DIAGNOSIS — R5381 Other malaise: Secondary | ICD-10-CM | POA: Diagnosis not present

## 2012-04-08 DIAGNOSIS — R001 Bradycardia, unspecified: Secondary | ICD-10-CM

## 2012-04-08 DIAGNOSIS — R5383 Other fatigue: Secondary | ICD-10-CM | POA: Diagnosis not present

## 2012-04-08 DIAGNOSIS — I10 Essential (primary) hypertension: Secondary | ICD-10-CM

## 2012-04-08 LAB — COMPREHENSIVE METABOLIC PANEL
ALT: 8 U/L (ref 0–35)
CO2: 30 mEq/L (ref 19–32)
Calcium: 9.7 mg/dL (ref 8.4–10.5)
Chloride: 104 mEq/L (ref 96–112)
Sodium: 141 mEq/L (ref 135–145)
Total Bilirubin: 0.7 mg/dL (ref 0.3–1.2)
Total Protein: 6.5 g/dL (ref 6.0–8.3)

## 2012-04-08 LAB — POCT CBC
Granulocyte percent: 71.3 %G (ref 37–80)
MID (cbc): 0.4 (ref 0–0.9)
POC Granulocyte: 5.3 (ref 2–6.9)
POC LYMPH PERCENT: 22.8 %L (ref 10–50)
Platelet Count, POC: 251 10*3/uL (ref 142–424)
RDW, POC: 13.7 %

## 2012-04-08 LAB — POCT UA - MICROSCOPIC ONLY

## 2012-04-08 LAB — POCT URINALYSIS DIPSTICK: Bilirubin, UA: NEGATIVE

## 2012-04-08 MED ORDER — METOPROLOL TARTRATE 25 MG PO TABS: ORAL_TABLET | ORAL | Status: DC

## 2012-04-08 NOTE — Progress Notes (Signed)
[address]   [place], [place]  [address]   [phone]  Subjective:    Patient ID: Annette Farmer, female    DOB: [birth year], 66 y.o.   MRN: [id number]  HPIThis 66 y.o. female presents for evaluation of dizziness.  Suffering with dizziness while lying down; only occurs while turning to the L.  Took some friends to [facility]; the following afternoon upon awakening, developed nausea and small vomitus x 2.  Has been dizzy since then.  Onset 1.5 weeks ago. Similar symptoms one month ago; dizziness at night.  Felt horrible for two days with onset 1.5 weeks ago.  Has been weak and dizzy since then.  No dizziness during the day but has been suffering with weakness during the day.  While lying in bed, if turns to L, dizziness occurs.  Lasts a few minutes.  Usually turn back to R, then dizziness stops.  Felt so weak leaving church, had to get someone to help her to her car.  No headache; will get mild neck soreness.  No vision changes; no diplopia; has surgery 11/2011 and 12/2011 for cataracts.  No hearing loss in past two weeks with dizziness; history of hearing loss from previous CVA but stable.  No new tinnitus.  No new paresthesias; no focal weakness.  Sugars running 135-141.  No chest pain, palpitations, shortness of breath, leg swelling.  No fever/chills/sweats.  No recent medication changes other than decreasing Metformin to 1/2 dose.  Eating and drinking well; does not drink a lot due to decreased thirst.     Review of Systems  Constitutional: Negative for fever, chills, diaphoresis and fatigue.  HENT: Positive for rhinorrhea and tinnitus. Negative for hearing loss, ear pain, congestion, sore throat, sneezing, trouble swallowing, voice change, postnasal drip and sinus pressure.   Respiratory: Negative for cough, shortness of breath and wheezing.   Cardiovascular: Negative for chest pain, palpitations and leg swelling.  Gastrointestinal: Positive for nausea and vomiting. Negative for abdominal  pain and diarrhea.  Skin: Negative for rash.  Neurological: Positive for dizziness. Negative for tremors, seizures, syncope, facial asymmetry, speech difficulty, weakness, light-headedness, numbness and headaches.  Psychiatric/Behavioral: Negative for confusion.    No past medical history on file.  No past surgical history on file.  Prior to Admission medications   Medication Sig Start Date End Date Taking? Authorizing Provider  amLODipine (NORVASC) 5 MG tablet Take 1 tablet (5 mg total) by mouth daily. 03/08/12  Yes Elvina Sidle, MD  aspirin 325 MG tablet Take 325 mg by mouth daily.   Yes Historical Provider, MD  citalopram (CELEXA) 10 MG tablet Take 1 tablet (10 mg total) by mouth daily. 03/08/12  Yes Elvina Sidle, MD  gabapentin (NEURONTIN) 300 MG capsule Take 1 capsule (300 mg total) by mouth 3 (three) times daily. 03/08/12  Yes Elvina Sidle, MD  Glucose Blood (BLOOD GLUCOSE TEST STRIPS) STRP USE AS DIRECTED (True Balance Test Strips) 03/15/12  Yes Heather M Marte, PA-C  metFORMIN (GLUCOPHAGE) 500 MG tablet Take 1 tablet (500 mg total) by mouth daily at 10 pm. 03/08/12 03/08/13 Yes Elvina Sidle, MD  metoprolol tartrate (LOPRESSOR) 25 MG tablet Take 1 tablet (25 mg total) by mouth 2 (two) times daily. 03/08/12  Yes Elvina Sidle, MD    Allergies  Allergen Reactions  . Penicillins Nausea Only    History   Social History  . Marital Status: Single    Spouse Name: N/A    Number of Children: N/A  .  Years of Education: N/A   Occupational History  . Not on file.   Social History Main Topics  . Smoking status: Never Smoker   . Smokeless tobacco: Not on file  . Alcohol Use: Not on file  . Drug Use: Not on file  . Sexually Active: Not on file   Other Topics Concern  . Not on file   Social History Narrative  . No narrative on file    No family history on file.     Objective:   Physical Exam  Nursing note and vitals reviewed. Constitutional: She is  oriented to person, place, and time. She appears well-developed and well-nourished. No distress.  HENT:  Head: Normocephalic and atraumatic.  Right Ear: External ear normal.  Left Ear: External ear normal.  Nose: Nose normal.  Mouth/Throat: Oropharynx is clear and moist.  Eyes: Conjunctivae normal are normal. Pupils are equal, round, and reactive to light.  Neck: Normal range of motion. Neck supple. No thyromegaly present.  Cardiovascular: Normal rate, regular rhythm, normal heart sounds and intact distal pulses.  Exam reveals no gallop and no friction rub.   No murmur heard. Pulmonary/Chest: Effort normal and breath sounds normal. She has no wheezes. She has no rales.  Abdominal: Soft. Bowel sounds are normal. There is no tenderness. There is no rebound and no guarding.  Lymphadenopathy:    She has no cervical adenopathy.  Neurological: She is alert and oriented to person, place, and time. No cranial nerve deficit. She exhibits normal muscle tone. She displays a negative Romberg sign. Coordination normal.       Gait slowed.  Dizziness with position changes (supine to sitting and sitting to supine).  Skin: She is not diaphoretic.       +excoriations B ankles with petechia diffusely.  Psychiatric: She has a normal mood and affect. Her behavior is normal. Judgment and thought content normal.    Results for orders placed in visit on 04/08/12  POCT CBC      Component Value Range   WBC 7.4  4.6 - 10.2 K/uL   Lymph, poc 1.7  0.6 - 3.4   POC LYMPH PERCENT 22.8  10 - 50 %L   MID (cbc) 0.4  0 - 0.9   POC MID % 5.9  0 - 12 %M   POC Granulocyte 5.3  2 - 6.9   Granulocyte percent 71.3  37 - 80 %G   RBC 4.88  4.04 - 5.48 M/uL   Hemoglobin 14.2  12.2 - 16.2 g/dL   HCT, POC 16.1  09.6 - 47.9 %   MCV 94.4  80 - 97 fL   MCH, POC 29.1  27 - 31.2 pg   MCHC 30.8 (*) 31.8 - 35.4 g/dL   RDW, POC 04.5     Platelet Count, POC 251  142 - 424 K/uL   MPV 9.1  0 - 99.8 fL  POCT UA - MICROSCOPIC ONLY       Component Value Range   WBC, Ur, HPF, POC 4-8     RBC, urine, microscopic 0-1     Bacteria, U Microscopic 4+     Mucus, UA neg     Epithelial cells, urine per micros 0-1     Crystals, Ur, HPF, POC neg     Casts, Ur, LPF, POC neg     Yeast, UA neg    POCT URINALYSIS DIPSTICK      Component Value Range   Color, UA yellow  Clarity, UA clear     Glucose, UA neg     Bilirubin, UA neg     Ketones, UA trace     Spec Grav, UA 1.025     Blood, UA neg     pH, UA 5.5     Protein, UA neg     Urobilinogen, UA 1.0     Nitrite, UA positive     Leukocytes, UA small (1+)     EKG:  SINUS BRADYCARDIA AT 44; NORMAL PR AND QRS INTERVALS.        Assessment & Plan:   1. Dizziness  EKG 12-Lead, POCT CBC, POCT UA - Microscopic Only, POCT urinalysis dipstick, Comprehensive metabolic panel  2. Weakness    3. Type II or unspecified type diabetes mellitus without mention of complication, not stated as uncontrolled    4. Essential hypertension, benign    5. H/O: CVA (cerebrovascular accident)      1. Dizziness:  New.  Occurs with position changes and with turning head; consistent with vertigo.Supportive care with changing positions slowly; most dizziness occurring at night which makes falling less likely.    Obtain labs.  Bradycardic on EKG; advised to 1/2 B blocker dose.  Follow-up in two weeks with PCP. 2.  Weakness:  New.  Associated with dizziness.  Obtain labs. May very well be secondary to bradycarida; to 1/2 B blocker dose.  Follow-up in two weeks. 3. DMII: stable; obtain labs. 4. HTN: stable.   5. CVA hx: stable.  No focal neurological findings on exam.    Meds ordered this encounter  Medications  . DISCONTD: metoprolol tartrate (LOPRESSOR) 25 MG tablet    Sig: 1/2 TABLET TWO TIMES DAILY    Dispense:  60 tablet    Refill:  11

## 2012-04-08 NOTE — Patient Instructions (Addendum)
1. Dizziness  EKG 12-Lead, POCT CBC, POCT UA - Microscopic Only, POCT urinalysis dipstick, Comprehensive metabolic panel  2. Weakness    3. Type II or unspecified type diabetes mellitus without mention of complication, not stated as uncontrolled    4. Essential hypertension, benign    5. H/O: CVA (cerebrovascular accident)       1.  DECREASE METOPROLOL 25MG  TO 1/2 TABLET TWICE DAILY UNTIL NEXT APPOINTMENT WITH DR.  Milus Glazier. 2.  INCREASE FLUID INTAKE DAILY. 3.  FOLLOW-UP WITH DR. Milus Glazier IN UPCOMING 1-2 WEEKS.

## 2012-04-10 LAB — URINE CULTURE

## 2012-04-16 ENCOUNTER — Telehealth: Payer: Self-pay

## 2012-04-16 NOTE — Telephone Encounter (Signed)
Pt states was seen 1 1/2 weeks ago, and wants to know when she should follow up

## 2012-04-17 NOTE — Telephone Encounter (Signed)
Recommended pt follow-up with Dr. Milus Glazier her PCP in 2-3 weeks at appointment center.  I will route message to scheduling pool to schedule pt an appointment.  KMS

## 2012-04-17 NOTE — Telephone Encounter (Signed)
Spoke w/pt to clarify she is to f/up in 2-3 wks after OV w/Dr Katrinka Blazing. Transferred pt to 104 to try to set up appt.

## 2012-04-17 NOTE — Telephone Encounter (Signed)
Spoke with pt scheduled appt for 04/23/12 with dr Milus Glazier

## 2012-04-23 ENCOUNTER — Encounter: Payer: Self-pay | Admitting: Family Medicine

## 2012-04-23 ENCOUNTER — Ambulatory Visit (INDEPENDENT_AMBULATORY_CARE_PROVIDER_SITE_OTHER): Payer: Medicare Other | Admitting: Family Medicine

## 2012-04-23 VITALS — BP 130/82 | HR 66 | Temp 97.6°F | Resp 16 | Ht 61.0 in | Wt 150.6 lb

## 2012-04-23 DIAGNOSIS — R7309 Other abnormal glucose: Secondary | ICD-10-CM

## 2012-04-23 DIAGNOSIS — R739 Hyperglycemia, unspecified: Secondary | ICD-10-CM

## 2012-04-23 DIAGNOSIS — G609 Hereditary and idiopathic neuropathy, unspecified: Secondary | ICD-10-CM | POA: Diagnosis not present

## 2012-04-23 DIAGNOSIS — R42 Dizziness and giddiness: Secondary | ICD-10-CM

## 2012-04-23 DIAGNOSIS — Z23 Encounter for immunization: Secondary | ICD-10-CM | POA: Diagnosis not present

## 2012-04-23 DIAGNOSIS — G629 Polyneuropathy, unspecified: Secondary | ICD-10-CM

## 2012-04-23 DIAGNOSIS — I69351 Hemiplegia and hemiparesis following cerebral infarction affecting right dominant side: Secondary | ICD-10-CM

## 2012-04-23 MED ORDER — GABAPENTIN 300 MG PO CAPS: 300.0000 mg | ORAL_CAPSULE | Freq: Every day | ORAL | Status: DC

## 2012-04-23 NOTE — Progress Notes (Signed)
66 yo woman with positional dizziness, made worse when looking up or turning her head to the left.  Her symptoms are better than last visit.  She has had no further vomiting.  No neck pain, no fever Right foot continues to burn, particularly at night Some weakness persists in right hand from the past stroke.  Objective: Results for orders placed in visit on 04/08/12  POCT CBC      Component Value Range   WBC 7.4  4.6 - 10.2 K/uL   Lymph, poc 1.7  0.6 - 3.4   POC LYMPH PERCENT 22.8  10 - 50 %L   MID (cbc) 0.4  0 - 0.9   POC MID % 5.9  0 - 12 %M   POC Granulocyte 5.3  2 - 6.9   Granulocyte percent 71.3  37 - 80 %G   RBC 4.88  4.04 - 5.48 M/uL   Hemoglobin 14.2  12.2 - 16.2 g/dL   HCT, POC 16.1  09.6 - 47.9 %   MCV 94.4  80 - 97 fL   MCH, POC 29.1  27 - 31.2 pg   MCHC 30.8 (*) 31.8 - 35.4 g/dL   RDW, POC 04.5     Platelet Count, POC 251  142 - 424 K/uL   MPV 9.1  0 - 99.8 fL  POCT UA - MICROSCOPIC ONLY      Component Value Range   WBC, Ur, HPF, POC 4-8     RBC, urine, microscopic 0-1     Bacteria, U Microscopic 4+     Mucus, UA neg     Epithelial cells, urine per micros 0-1     Crystals, Ur, HPF, POC neg     Casts, Ur, LPF, POC neg     Yeast, UA neg    POCT URINALYSIS DIPSTICK      Component Value Range   Color, UA yellow     Clarity, UA clear     Glucose, UA neg     Bilirubin, UA neg     Ketones, UA trace     Spec Grav, UA 1.025     Blood, UA neg     pH, UA 5.5     Protein, UA neg     Urobilinogen, UA 1.0     Nitrite, UA positive     Leukocytes, UA small (1+)    COMPREHENSIVE METABOLIC PANEL      Component Value Range   Sodium 141  135 - 145 mEq/L   Potassium 4.2  3.5 - 5.3 mEq/L   Chloride 104  96 - 112 mEq/L   CO2 30  19 - 32 mEq/L   Glucose, Bld 100 (*) 70 - 99 mg/dL   BUN 19  6 - 23 mg/dL   Creat 4.09  8.11 - 9.14 mg/dL   Total Bilirubin 0.7  0.3 - 1.2 mg/dL   Alkaline Phosphatase 72  39 - 117 U/L   AST 11  0 - 37 U/L   ALT 8  0 - 35 U/L   Total  Protein 6.5  6.0 - 8.3 g/dL   Albumin 4.5  3.5 - 5.2 g/dL   Calcium 9.7  8.4 - 78.2 mg/dL  URINE CULTURE      Component Value Range   Culture ESCHERICHIA COLI     Colony Count >=100,000 COLONIES/ML     Organism ID, Bacteria ESCHERICHIA COLI     Last Hgb A1C was 5.3  Chest:  Clear Heart:  Regular,  I/VI systolic murmur Neck:  No bruit or thyromegaly, no adenopathy Skin:  No rashes Ext: good peripheral pulses, stiff right hand grip.  Assessment:   1. Peripheral neuropathy post stroke 2. Dizziness which may be vertebrobasilar

## 2012-06-14 ENCOUNTER — Ambulatory Visit (INDEPENDENT_AMBULATORY_CARE_PROVIDER_SITE_OTHER): Payer: Medicare Other | Admitting: Family Medicine

## 2012-06-14 ENCOUNTER — Encounter: Payer: Self-pay | Admitting: Family Medicine

## 2012-06-14 VITALS — BP 126/71 | HR 53 | Temp 97.7°F | Resp 18 | Ht 61.5 in | Wt 153.0 lb

## 2012-06-14 DIAGNOSIS — I10 Essential (primary) hypertension: Secondary | ICD-10-CM | POA: Diagnosis not present

## 2012-06-14 DIAGNOSIS — R42 Dizziness and giddiness: Secondary | ICD-10-CM | POA: Diagnosis not present

## 2012-06-14 MED ORDER — METOPROLOL TARTRATE 25 MG PO TABS: 12.5000 mg | ORAL_TABLET | Freq: Every day | ORAL | Status: DC

## 2012-06-14 NOTE — Patient Instructions (Signed)
Reduce the metoprolol to 1/2 tablet ONCE daily now  Come back mid-March

## 2012-06-14 NOTE — Progress Notes (Signed)
67 yo woman with dizzy spells when turning head to the left or getting up quickly.  She fell last Saturday night spontaneously.  Onset of dizziness 2-3 months.  It has improved.  Vomiting has lessened.  Objective:  Slowed speech, good eye contact.  Moving arms equally. Neck: supple, no bruit, good carotid pulse Chest:  Clear Heart: regular, no murmur  No edema

## 2012-06-25 ENCOUNTER — Ambulatory Visit (INDEPENDENT_AMBULATORY_CARE_PROVIDER_SITE_OTHER): Payer: Medicare Other | Admitting: Ophthalmology

## 2012-06-25 DIAGNOSIS — I10 Essential (primary) hypertension: Secondary | ICD-10-CM

## 2012-06-25 DIAGNOSIS — H2700 Aphakia, unspecified eye: Secondary | ICD-10-CM

## 2012-06-25 DIAGNOSIS — E11319 Type 2 diabetes mellitus with unspecified diabetic retinopathy without macular edema: Secondary | ICD-10-CM

## 2012-06-25 DIAGNOSIS — D3130 Benign neoplasm of unspecified choroid: Secondary | ICD-10-CM

## 2012-06-25 DIAGNOSIS — H43819 Vitreous degeneration, unspecified eye: Secondary | ICD-10-CM

## 2012-06-25 DIAGNOSIS — E1165 Type 2 diabetes mellitus with hyperglycemia: Secondary | ICD-10-CM

## 2012-06-25 DIAGNOSIS — H35039 Hypertensive retinopathy, unspecified eye: Secondary | ICD-10-CM

## 2012-07-02 NOTE — Progress Notes (Signed)
Reviewed and agree.

## 2012-07-28 ENCOUNTER — Telehealth: Payer: Self-pay

## 2012-07-28 NOTE — Telephone Encounter (Signed)
DR. L - PT WANTS TO KNOW IF SHE SHOULD CONTINUE TAKING CELEXA [phone]

## 2012-07-30 NOTE — Telephone Encounter (Signed)
Does the celexa help? Called voice mail not set up yet.

## 2012-07-30 NOTE — Telephone Encounter (Signed)
She states she ran out of this several days ago. She states she wants to let you know she does not want to take these anymore.

## 2012-08-09 ENCOUNTER — Ambulatory Visit (INDEPENDENT_AMBULATORY_CARE_PROVIDER_SITE_OTHER): Payer: Medicare Other | Admitting: Family Medicine

## 2012-08-09 ENCOUNTER — Encounter: Payer: Self-pay | Admitting: Family Medicine

## 2012-08-09 VITALS — BP 122/80 | HR 73 | Temp 97.9°F | Resp 16 | Ht 61.5 in | Wt 153.2 lb

## 2012-08-09 DIAGNOSIS — E1149 Type 2 diabetes mellitus with other diabetic neurological complication: Secondary | ICD-10-CM | POA: Diagnosis not present

## 2012-08-09 DIAGNOSIS — I10 Essential (primary) hypertension: Secondary | ICD-10-CM | POA: Diagnosis not present

## 2012-08-09 DIAGNOSIS — H01009 Unspecified blepharitis unspecified eye, unspecified eyelid: Secondary | ICD-10-CM

## 2012-08-09 LAB — POCT GLYCOSYLATED HEMOGLOBIN (HGB A1C): Hemoglobin A1C: 5.8

## 2012-08-09 MED ORDER — TOBRAMYCIN 0.3 % OP SOLN: 1.0000 [drp] | Freq: Three times a day (TID) | OPHTHALMIC | Status: DC

## 2012-08-09 NOTE — Progress Notes (Signed)
67 yo hypertensive patient with right eye grittiness x 1 month.   Dizziness is better Not checking BP Blood sugars below 150, checked once a week on Fridays.  Objective:  NAD, alert and engaging Speech is slow and deliberate Right hemiparesis persists. Eyes:  PERLA, good fundi, mild lower lid margin exudate Chest:  Clear Heart:  Reg, no murmur Feet:  No ulcers or rash Results for orders placed in visit on 08/09/12  POCT GLYCOSYLATED HEMOGLOBIN (HGB A1C)      Result Value Range   Hemoglobin A1C 5.8       Assessment:  Stable BP and diabetes, mild blepharitis.  Plan:  Blepharitis, unspecified laterality - Plan: tobramycin (TOBREX) 0.3 % ophthalmic solution  Unspecified essential hypertension  Type II or unspecified type diabetes mellitus with neurological manifestations, not stated as uncontrolled(250.60) - Plan: POCT glycosylated hemoglobin (Hb A1C)  Recheck 3 months

## 2012-08-11 ENCOUNTER — Telehealth: Payer: Self-pay | Admitting: Family Medicine

## 2012-08-11 NOTE — Telephone Encounter (Signed)
Fyi.  Pt called to let you know that her eyes are better after using the eye drops

## 2012-08-13 NOTE — Telephone Encounter (Signed)
Dr.Lauenstein, Pt states that her right eye is still scratchy and would like to know if you would like for her to continue using the eye drops that were prescribed.FYI (Left eye is doing fine)  Best [phone]

## 2012-08-14 ENCOUNTER — Other Ambulatory Visit: Payer: Self-pay | Admitting: Family Medicine

## 2012-08-14 DIAGNOSIS — H01003 Unspecified blepharitis right eye, unspecified eyelid: Secondary | ICD-10-CM

## 2012-08-14 MED ORDER — PREDNISOLONE ACETATE 1 % OP SUSP: 1.0000 [drp] | Freq: Three times a day (TID) | OPHTHALMIC | Status: DC

## 2012-08-14 NOTE — Telephone Encounter (Signed)
pred forte called in.  Let me know if not better in 48 hours

## 2012-12-24 ENCOUNTER — Ambulatory Visit (INDEPENDENT_AMBULATORY_CARE_PROVIDER_SITE_OTHER): Payer: Medicare Other | Admitting: Ophthalmology

## 2013-03-18 ENCOUNTER — Other Ambulatory Visit: Payer: Self-pay | Admitting: Family Medicine

## 2013-03-19 ENCOUNTER — Other Ambulatory Visit: Payer: Self-pay

## 2013-05-11 ENCOUNTER — Other Ambulatory Visit: Payer: Self-pay | Admitting: Family Medicine

## 2013-05-13 ENCOUNTER — Other Ambulatory Visit: Payer: Self-pay | Admitting: Family Medicine

## 2013-06-05 ENCOUNTER — Encounter: Payer: Self-pay | Admitting: Family Medicine

## 2013-06-05 ENCOUNTER — Ambulatory Visit (INDEPENDENT_AMBULATORY_CARE_PROVIDER_SITE_OTHER): Payer: Self-pay | Admitting: Ophthalmology

## 2013-06-05 ENCOUNTER — Ambulatory Visit (INDEPENDENT_AMBULATORY_CARE_PROVIDER_SITE_OTHER): Payer: Medicare Other | Admitting: Family Medicine

## 2013-06-05 VITALS — BP 155/94 | HR 84 | Temp 98.3°F | Resp 18 | Ht 61.5 in | Wt 163.0 lb

## 2013-06-05 DIAGNOSIS — I10 Essential (primary) hypertension: Secondary | ICD-10-CM

## 2013-06-05 DIAGNOSIS — R7309 Other abnormal glucose: Secondary | ICD-10-CM | POA: Diagnosis not present

## 2013-06-05 DIAGNOSIS — Z23 Encounter for immunization: Secondary | ICD-10-CM | POA: Diagnosis not present

## 2013-06-05 DIAGNOSIS — Z8673 Personal history of transient ischemic attack (TIA), and cerebral infarction without residual deficits: Secondary | ICD-10-CM | POA: Diagnosis not present

## 2013-06-05 DIAGNOSIS — R739 Hyperglycemia, unspecified: Secondary | ICD-10-CM

## 2013-06-05 LAB — CBC WITH DIFFERENTIAL/PLATELET
Basophils Absolute: 0 10*3/uL (ref 0.0–0.1)
Basophils Relative: 0 % (ref 0–1)
Eosinophils Absolute: 0.1 10*3/uL (ref 0.0–0.7)
Eosinophils Relative: 2 % (ref 0–5)
HCT: 41.5 % (ref 36.0–46.0)
Hemoglobin: 14.4 g/dL (ref 12.0–15.0)
Lymphocytes Relative: 26 % (ref 12–46)
Lymphs Abs: 1.9 10*3/uL (ref 0.7–4.0)
MCH: 29.8 pg (ref 26.0–34.0)
MCHC: 34.7 g/dL (ref 30.0–36.0)
MCV: 85.9 fL (ref 78.0–100.0)
Monocytes Absolute: 0.4 10*3/uL (ref 0.1–1.0)
Monocytes Relative: 5 % (ref 3–12)
Neutro Abs: 4.8 10*3/uL (ref 1.7–7.7)
Neutrophils Relative %: 67 % (ref 43–77)
Platelets: 230 10*3/uL (ref 150–400)
RBC: 4.83 MIL/uL (ref 3.87–5.11)
RDW: 14.2 % (ref 11.5–15.5)
WBC: 7.2 10*3/uL (ref 4.0–10.5)

## 2013-06-05 LAB — COMPREHENSIVE METABOLIC PANEL
ALT: <8 U/L (ref 0–35)
AST: 11 U/L (ref 0–37)
Albumin: 4 g/dL (ref 3.5–5.2)
Alkaline Phosphatase: 92 U/L (ref 39–117)
BUN: 24 mg/dL — ABNORMAL HIGH (ref 6–23)
CO2: 24 mEq/L (ref 19–32)
Calcium: 9.2 mg/dL (ref 8.4–10.5)
Chloride: 107 mEq/L (ref 96–112)
Creat: 0.98 mg/dL (ref 0.50–1.10)
Glucose, Bld: 113 mg/dL — ABNORMAL HIGH (ref 70–99)
Potassium: 4.1 mEq/L (ref 3.5–5.3)
Sodium: 142 mEq/L (ref 135–145)
Total Bilirubin: 0.8 mg/dL (ref 0.3–1.2)
Total Protein: 6.6 g/dL (ref 6.0–8.3)

## 2013-06-05 LAB — POCT GLYCOSYLATED HEMOGLOBIN (HGB A1C): Hemoglobin A1C: 6

## 2013-06-05 MED ORDER — BLOOD GLUCOSE TEST VI STRP: 1.0000 "application " | ORAL_STRIP | Freq: Every morning | Status: DC

## 2013-06-05 MED ORDER — AMLODIPINE BESYLATE 5 MG PO TABS: ORAL_TABLET | ORAL | Status: DC

## 2013-06-05 MED ORDER — METOPROLOL SUCCINATE ER 25 MG PO TB24: 25.0000 mg | ORAL_TABLET | Freq: Every day | ORAL | Status: DC

## 2013-06-05 MED ORDER — GABAPENTIN 300 MG PO CAPS: ORAL_CAPSULE | ORAL | Status: DC

## 2013-06-05 NOTE — Patient Instructions (Signed)

## 2013-06-05 NOTE — Progress Notes (Signed)
Patient ID: Annette Farmer MRN: [id number], DOB: [birth year], 68 y.o. Date of Encounter: 06/05/2013, 10:33 AM  Primary Physician: User Centricity, MD  Chief Complaint: HTN  HPI: 68 y.o. year old female with history below presents for hypertension follow up.  She suffered a CVA in the remote past and has some residual right sided weakness and uses a cane.  Cannot get up from floor.  She spends her days watching TV and once a week has a prayer meeting with friends and goes to church  No CP, HA, visual changes, or focal deficits.   Past Medical History  Diagnosis Date  . Diabetes mellitus without complication   . Hypertension   . Depression   . Stroke      Home Meds: Prior to Admission medications   Medication Sig Start Date End Date Taking? Authorizing Provider  amLODipine (NORVASC) 5 MG tablet One daily 06/05/13  Yes Robyn Haber, MD  aspirin 325 MG tablet Take 325 mg by mouth daily.   Yes Historical Provider, MD  gabapentin (NEURONTIN) 300 MG capsule One qhs 06/05/13  Yes Robyn Haber, MD  Glucose Blood (BLOOD GLUCOSE TEST STRIPS) STRP Apply 1 application topically every morning. 06/05/13  Yes Robyn Haber, MD  metoprolol succinate (TOPROL-XL) 25 MG 24 hr tablet Take 1 tablet (25 mg total) by mouth daily. 06/05/13   Robyn Haber, MD    Allergies:  Allergies  Allergen Reactions  . Penicillins Nausea Only    History   Social History  . Marital Status: Single    Spouse Name: N/A    Number of Children: N/A  . Years of Education: N/A   Occupational History  . Not on file.   Social History Main Topics  . Smoking status: Never Smoker   . Smokeless tobacco: Not on file  . Alcohol Use: Not on file  . Drug Use: Not on file  . Sexual Activity: Not on file   Other Topics Concern  . Not on file   Social History Narrative  . No narrative on file     No family history on file.  Review of Systems: Constitutional: negative for chills, fever, night sweats,  weight changes, or fatigue  HEENT: negative for vision changes, hearing loss, congestion, rhinorrhea, ST, epistaxis, or sinus pressure Cardiovascular: negative for chest pain, palpitations, or DOE Respiratory: negative for hemoptysis, wheezing, shortness of breath, or cough Abdominal: negative for abdominal pain, nausea, vomiting, diarrhea, or constipation Dermatological: negative for rash Neurologic: negative for headache, dizziness, or syncope All other systems reviewed and are otherwise negative with the exception to those above and in the HPI.   Physical Exam: Blood pressure 155/94, pulse 84, temperature 98.3 F (36.8 C), resp. rate 18, height 5' 1.5" (1.562 m), weight 163 lb (73.936 kg), SpO2 100.00%., Body mass index is 30.3 kg/(m^2). General: Well developed, well nourished, in no acute distress. Head: Normocephalic, atraumatic, eyes without discharge, sclera non-icteric, nares are without discharge. Bilateral auditory canals clear, TM's are without perforation, pearly grey and translucent with reflective cone of light bilaterally. Oral cavity moist, posterior pharynx without exudate, erythema, peritonsillar abscess, or post nasal drip.  Neck: Supple. No thyromegaly. Full ROM. No lymphadenopathy. No carotid bruits. Lungs: Clear bilaterally to auscultation without wheezes, rales, or rhonchi. Breathing is unlabored. Heart: RRR with S1 S2. No murmurs, rubs, or gallops appreciated.  Abdomen: Soft, non-tender, non-distended with normoactive bowel sounds. No hepatosplenomegaly. No rebound/guarding. No obvious abdominal masses. Msk:  Strength and tone normal for age. Extremities/Skin:  Warm and dry. No clubbing or cyanosis. No edema. No rashes or suspicious lesions. Distal pulses 2+ and equal bilaterally. Neuro: Alert and oriented X 3. Moves all extremities spontaneously. Gait is normal. CNII-XII grossly in tact. DTR 2+, cerebellar function intact. Rhomberg unsteady.  Has some right decrease in  grasp Psych:  Responds to questions appropriately with a normal affect.  Patient had episode of incontinence and could not provide urine specimen Labs:  CMP pending  ASSESSMENT AND PLAN:  68 y.o. year old female with Hyperglycemia - Plan: Glucose Blood (BLOOD GLUCOSE TEST STRIPS) STRP, POCT urinalysis dipstick  Unspecified essential hypertension - Plan: amLODipine (NORVASC) 5 MG tablet, metoprolol succinate (TOPROL-XL) 25 MG 24 hr tablet, Comprehensive metabolic panel, POCT urinalysis dipstick, POCT CBC  H/O: CVA (cerebrovascular accident) - Plan: gabapentin (NEURONTIN) 300 MG capsule   Signed, Robyn Haber, MD 06/05/2013 10:33 AM

## 2013-06-12 ENCOUNTER — Telehealth: Payer: Self-pay

## 2013-06-12 NOTE — Telephone Encounter (Signed)
Wants to know if she is to cut the metoprolol succinate (TOPROL-XL) 25 MG 24 hr tablet In half   [phone]

## 2013-06-13 NOTE — Telephone Encounter (Signed)
lmom to take 1 tab once a day

## 2013-06-13 NOTE — Telephone Encounter (Signed)
No need to split in half.  Take just one once a day

## 2013-06-13 NOTE — Telephone Encounter (Signed)
Dr Joseph Art, I dont see in your notes that she had to cut half. Please advise.

## 2013-06-14 NOTE — Telephone Encounter (Signed)
Pt got message and CB because she was confused since she has been cutting med in half and she doesn't remember Dr L telling her at [place] to take the whole tablet. I advised her that we did send her question to Dr L, and he advised her to take the whole tablet. I told pt that since her BP was high at OV, the whole tablet may control her BP better. Advised pt that she may have some slight dizziness for a few days but if she experiences severe dizziness or if dizziness continues past a few days to call us. Pt agreed.

## 2013-08-09 ENCOUNTER — Other Ambulatory Visit: Payer: Self-pay | Admitting: Family Medicine

## 2013-11-11 ENCOUNTER — Other Ambulatory Visit: Payer: Self-pay | Admitting: Physician Assistant

## 2014-06-22 ENCOUNTER — Ambulatory Visit (INDEPENDENT_AMBULATORY_CARE_PROVIDER_SITE_OTHER): Payer: Medicare Other | Admitting: Family Medicine

## 2014-06-22 VITALS — BP 126/82 | HR 69 | Temp 98.2°F | Resp 20 | Ht 61.0 in | Wt 172.8 lb

## 2014-06-22 DIAGNOSIS — E119 Type 2 diabetes mellitus without complications: Secondary | ICD-10-CM

## 2014-06-22 DIAGNOSIS — I10 Essential (primary) hypertension: Secondary | ICD-10-CM

## 2014-06-22 DIAGNOSIS — Z23 Encounter for immunization: Secondary | ICD-10-CM

## 2014-06-22 LAB — POCT GLYCOSYLATED HEMOGLOBIN (HGB A1C): Hemoglobin A1C: 6.2

## 2014-06-22 MED ORDER — PNEUMOCOCCAL 13-VAL CONJ VACC IM SUSP: 0.5000 mL | INTRAMUSCULAR | Status: DC

## 2014-06-22 NOTE — Progress Notes (Addendum)
° °  Subjective:    Patient ID: Annette Farmer, female    DOB: 08-06-1945, 69 y.o.   MRN: [id number] This chart was scribed for Robyn Haber, MD by Zola Button, Medical Scribe. This patient was seen in Room 9 and the patient's care was started at 2:37 PM.   HPI HPI Comments: Annette Farmer is a 69 y.o. female with a hx of  DM and obesity who presents to the [facility] for a medication refill. She states she was diagnosed with DM when she had a CVA in 2012. Patient states she has not checked her blood sugar in 1 month because she left it at someone's house and he/she has had difficulty mailing it back to her. She states it has been several years since she last got her eyes checked. She also has not had a mammogram in several years, since her retirement. Patient has several bug bites on her ankles that she states does not itch.   Patient states she has been managing by herself and drives herself around.  Review of Systems     Objective:   Physical Exam CONSTITUTIONAL: Well developed/well nourished HEAD: Normocephalic/atraumatic EYES: EOM/PERRL ENMT: Mucous membranes moist NECK: supple no meningeal signs SPINE: entire spine nontender CV: S1/S2 noted, no murmurs/rubs/gallops noted LUNGS: Lungs are clear to auscultation bilaterally, no apparent distress ABDOMEN: soft, nontender, no rebound or guarding GU: no cva tenderness NEURO: Pt is awake/alert, moves all extremitiesx4. Normal monofilament test. EXTREMITIES: Weak pulses in feet.  SKIN: warm. Multiple red papules around ankles. Small blister on left second toe, dorsally. PSYCH: no abnormalities of mood noted  Results for orders placed or performed in visit on 06/22/14  POCT glycosylated hemoglobin (Hb A1C)  Result Value Ref Range   Hemoglobin A1C 6.2         Assessment & Plan:   This chart was scribed in my presence and reviewed by me personally.    ICD-9-CM ICD-10-CM   1. Diabetes type 2, controlled  250.00 E11.9 Comprehensive metabolic panel     Lipid panel     Microalbumin, urine     POCT glycosylated hemoglobin (Hb A1C)     pneumococcal 13-valent conjugate vaccine (PREVNAR 13) injection 0.5 mL  2. Essential hypertension 401.9 I10    Where unable to get a urine test because patient is incontinent, wears diapers, and does not know when she is going to go to the bathroom.  Signed, Robyn Haber, MD

## 2014-06-22 NOTE — Patient Instructions (Signed)
Your diabetes is well controlled     .Health Maintenance Adopting a healthy lifestyle and getting preventive care can go a long way to promote health and wellness. Talk with your health care provider about what schedule of regular examinations is right for you. This is a good chance for you to check in with your provider about disease prevention and staying healthy. In between checkups, there are plenty of things you can do on your own. Experts have done a lot of research about which lifestyle changes and preventive measures are most likely to keep you healthy. Ask your health care provider for more information. WEIGHT AND DIET  Eat a healthy diet  Be sure to include plenty of vegetables, fruits, low-fat dairy products, and lean protein.  Do not eat a lot of foods high in solid fats, added sugars, or salt.  Get regular exercise. This is one of the most important things you can do for your health.  Most adults should exercise for at least 150 minutes each week. The exercise should increase your heart rate and make you sweat (moderate-intensity exercise).  Most adults should also do strengthening exercises at least twice a week. This is in addition to the moderate-intensity exercise.  Maintain a healthy weight  Body mass index (BMI) is a measurement that can be used to identify possible weight problems. It estimates body fat based on height and weight. Your health care provider can help determine your BMI and help you achieve or maintain a healthy weight.  For females 1 years of age and older:   A BMI below 18.5 is considered underweight.  A BMI of 18.5 to 24.9 is normal.  A BMI of 25 to 29.9 is considered overweight.  A BMI of 30 and above is considered obese.  Watch levels of cholesterol and blood lipids  You should start having your blood tested for lipids and cholesterol at 71 years of age, then have this test every 5 years.  You may need to have your cholesterol levels  checked more often if:  Your lipid or cholesterol levels are high.  You are older than 71 years of age.  You are at high risk for heart disease.  CANCER SCREENING   Lung Cancer  Lung cancer screening is recommended for adults 23-35 years old who are at high risk for lung cancer because of a history of smoking.  A yearly low-dose CT scan of the lungs is recommended for people who:  Currently smoke.  Have quit within the past 15 years.  Have at least a 30-pack-year history of smoking. A pack year is smoking an average of one pack of cigarettes a day for 1 year.  Yearly screening should continue until it has been 15 years since you quit.  Yearly screening should stop if you develop a health problem that would prevent you from having lung cancer treatment.  Breast Cancer  Practice breast self-awareness. This means understanding how your breasts normally appear and feel.  It also means doing regular breast self-exams. Let your health care provider know about any changes, no matter how small.  If you are in your 20s or 30s, you should have a clinical breast exam (CBE) by a health care provider every 1-3 years as part of a regular health exam.  If you are 63 or older, have a CBE every year. Also consider having a breast X-ray (mammogram) every year.  If you have a family history of breast cancer, talk to your health  care provider about genetic screening.  If you are at high risk for breast cancer, talk to your health care provider about having an MRI and a mammogram every year.  Breast cancer gene (BRCA) assessment is recommended for women who have family members with BRCA-related cancers. BRCA-related cancers include:  Breast.  Ovarian.  Tubal.  Peritoneal cancers.  Results of the assessment will determine the need for genetic counseling and BRCA1 and BRCA2 testing. Cervical Cancer Routine pelvic examinations to screen for cervical cancer are no longer recommended for  nonpregnant women who are considered low risk for cancer of the pelvic organs (ovaries, uterus, and vagina) and who do not have symptoms. A pelvic examination may be necessary if you have symptoms including those associated with pelvic infections. Ask your health care provider if a screening pelvic exam is right for you.   The Pap test is the screening test for cervical cancer for women who are considered at risk.  If you had a hysterectomy for a problem that was not cancer or a condition that could lead to cancer, then you no longer need Pap tests.  If you are older than 65 years, and you have had normal Pap tests for the past 10 years, you no longer need to have Pap tests.  If you have had past treatment for cervical cancer or a condition that could lead to cancer, you need Pap tests and screening for cancer for at least 20 years after your treatment.  If you no longer get a Pap test, assess your risk factors if they change (such as having a new sexual partner). This can affect whether you should start being screened again.  Some women have medical problems that increase their chance of getting cervical cancer. If this is the case for you, your health care provider may recommend more frequent screening and Pap tests.  The human papillomavirus (HPV) test is another test that may be used for cervical cancer screening. The HPV test looks for the virus that can cause cell changes in the cervix. The cells collected during the Pap test can be tested for HPV.  The HPV test can be used to screen women 43 years of age and older. Getting tested for HPV can extend the interval between normal Pap tests from three to five years.  An HPV test also should be used to screen women of any age who have unclear Pap test results.  After 71 years of age, women should have HPV testing as often as Pap tests.  Colorectal Cancer  This type of cancer can be detected and often prevented.  Routine colorectal cancer  screening usually begins at 71 years of age and continues through 71 years of age.  Your health care provider may recommend screening at an earlier age if you have risk factors for colon cancer.  Your health care provider may also recommend using home test kits to check for hidden blood in the stool.  A small camera at the end of a tube can be used to examine your colon directly (sigmoidoscopy or colonoscopy). This is done to check for the earliest forms of colorectal cancer.  Routine screening usually begins at age 31.  Direct examination of the colon should be repeated every 5-10 years through 71 years of age. However, you may need to be screened more often if early forms of precancerous polyps or small growths are found. Skin Cancer  Check your skin from head to toe regularly.  Tell your health  care provider about any new moles or changes in moles, especially if there is a change in a mole's shape or color.  Also tell your health care provider if you have a mole that is larger than the size of a pencil eraser.  Always use sunscreen. Apply sunscreen liberally and repeatedly throughout the day.  Protect yourself by wearing long sleeves, pants, a wide-brimmed hat, and sunglasses whenever you are outside. HEART DISEASE, DIABETES, AND HIGH BLOOD PRESSURE   Have your blood pressure checked at least every 1-2 years. High blood pressure causes heart disease and increases the risk of stroke.  If you are between 55 years and 87 years old, ask your health care provider if you should take aspirin to prevent strokes.  Have regular diabetes screenings. This involves taking a blood sample to check your fasting blood sugar level.  If you are at a normal weight and have a low risk for diabetes, have this test once every three years after 71 years of age.  If you are overweight and have a high risk for diabetes, consider being tested at a younger age or more often. PREVENTING INFECTION  Hepatitis  B  If you have a higher risk for hepatitis B, you should be screened for this virus. You are considered at high risk for hepatitis B if:  You were born in a country where hepatitis B is common. Ask your health care provider which countries are considered high risk.  Your parents were born in a high-risk country, and you have not been immunized against hepatitis B (hepatitis B vaccine).  You have HIV or AIDS.  You use needles to inject street drugs.  You live with someone who has hepatitis B.  You have had sex with someone who has hepatitis B.  You get hemodialysis treatment.  You take certain medicines for conditions, including cancer, organ transplantation, and autoimmune conditions. Hepatitis C  Blood testing is recommended for:  Everyone born from [birth year] through 1967.  Anyone with known risk factors for hepatitis C. Sexually transmitted infections (STIs)  You should be screened for sexually transmitted infections (STIs) including gonorrhea and chlamydia if:  You are sexually active and are younger than 71 years of age.  You are older than 71 years of age and your health care provider tells you that you are at risk for this type of infection.  Your sexual activity has changed since you were last screened and you are at an increased risk for chlamydia or gonorrhea. Ask your health care provider if you are at risk.  If you do not have HIV, but are at risk, it may be recommended that you take a prescription medicine daily to prevent HIV infection. This is called pre-exposure prophylaxis (PrEP). You are considered at risk if:  You are sexually active and do not regularly use condoms or know the HIV status of your partner(s).  You take drugs by injection.  You are sexually active with a partner who has HIV. Talk with your health care provider about whether you are at high risk of being infected with HIV. If you choose to begin PrEP, you should first be tested for HIV. You should  then be tested every 3 months for as long as you are taking PrEP.  PREGNANCY   If you are premenopausal and you may become pregnant, ask your health care provider about preconception counseling.  If you may become pregnant, take 400 to 800 micrograms (mcg) of folic acid every day.  If you want to prevent pregnancy, talk to your health care provider about birth control (contraception). OSTEOPOROSIS AND MENOPAUSE   Osteoporosis is a disease in which the bones lose minerals and strength with aging. This can result in serious bone fractures. Your risk for osteoporosis can be identified using a bone density scan.  If you are 92 years of age or older, or if you are at risk for osteoporosis and fractures, ask your health care provider if you should be screened.  Ask your health care provider whether you should take a calcium or vitamin D supplement to lower your risk for osteoporosis.  Menopause may have certain physical symptoms and risks.  Hormone replacement therapy may reduce some of these symptoms and risks. Talk to your health care provider about whether hormone replacement therapy is right for you.  HOME CARE INSTRUCTIONS   Schedule regular health, dental, and eye exams.  Stay current with your immunizations.   Do not use any tobacco products including cigarettes, chewing tobacco, or electronic cigarettes.  If you are pregnant, do not drink alcohol.  If you are breastfeeding, limit how much and how often you drink alcohol.  Limit alcohol intake to no more than 1 drink per day for nonpregnant women. One drink equals 12 ounces of beer, 5 ounces of wine, or 1 ounces of hard liquor.  Do not use street drugs.  Do not share needles.  Ask your health care provider for help if you need support or information about quitting drugs.  Tell your health care provider if you often feel depressed.  Tell your health care provider if you have ever been abused or do not feel safe at  home. Document Released: 11/22/2010 Document Revised: 09/23/2013 Document Reviewed: 04/10/2013 [facility] Patient Information 2015 [place], [place]. This information is not intended to replace advice given to you by your health care provider. Make sure you discuss any questions you have with your health care provider.

## 2014-06-23 LAB — COMPREHENSIVE METABOLIC PANEL
ALT: 13 U/L (ref 0–35)
AST: 14 U/L (ref 0–37)
Albumin: 3.8 g/dL (ref 3.5–5.2)
Alkaline Phosphatase: 102 U/L (ref 39–117)
BUN: 19 mg/dL (ref 6–23)
CO2: 23 mEq/L (ref 19–32)
Calcium: 9.1 mg/dL (ref 8.4–10.5)
Chloride: 106 mEq/L (ref 96–112)
Creat: 0.84 mg/dL (ref 0.50–1.10)
Glucose, Bld: 183 mg/dL — ABNORMAL HIGH (ref 70–99)
Potassium: 4.4 mEq/L (ref 3.5–5.3)
Sodium: 139 mEq/L (ref 135–145)
Total Bilirubin: 0.7 mg/dL (ref 0.2–1.2)
Total Protein: 6.4 g/dL (ref 6.0–8.3)

## 2014-06-23 LAB — LIPID PANEL
Cholesterol: 177 mg/dL (ref 0–200)
HDL: 42 mg/dL (ref >39)
LDL Cholesterol: 96 mg/dL (ref 0–99)
Total CHOL/HDL Ratio: 4.2 Ratio
Triglycerides: 193 mg/dL — ABNORMAL HIGH (ref <150)
VLDL: 39 mg/dL (ref 0–40)

## 2015-03-11 ENCOUNTER — Telehealth: Payer: Self-pay | Admitting: Family Medicine

## 2015-03-11 NOTE — Telephone Encounter (Signed)
SPOKE WITH PATIENT AND SHE WILL COME IN January TO SEE PROVIDER.  SHE COMES IN FOR HER ANNUAL EXAM AT THAT TIME.

## 2015-08-30 ENCOUNTER — Ambulatory Visit (INDEPENDENT_AMBULATORY_CARE_PROVIDER_SITE_OTHER): Payer: Medicare Other | Admitting: Family Medicine

## 2015-08-30 ENCOUNTER — Ambulatory Visit (INDEPENDENT_AMBULATORY_CARE_PROVIDER_SITE_OTHER): Payer: Medicare Other

## 2015-08-30 VITALS — BP 108/72 | HR 66 | Temp 97.5°F | Resp 16 | Ht 61.0 in | Wt 163.0 lb

## 2015-08-30 DIAGNOSIS — J209 Acute bronchitis, unspecified: Secondary | ICD-10-CM

## 2015-08-30 DIAGNOSIS — R9431 Abnormal electrocardiogram [ECG] [EKG]: Secondary | ICD-10-CM

## 2015-08-30 DIAGNOSIS — D72819 Decreased white blood cell count, unspecified: Secondary | ICD-10-CM | POA: Diagnosis not present

## 2015-08-30 DIAGNOSIS — J9811 Atelectasis: Secondary | ICD-10-CM

## 2015-08-30 DIAGNOSIS — R059 Cough, unspecified: Secondary | ICD-10-CM

## 2015-08-30 DIAGNOSIS — R05 Cough: Secondary | ICD-10-CM | POA: Diagnosis not present

## 2015-08-30 DIAGNOSIS — E118 Type 2 diabetes mellitus with unspecified complications: Secondary | ICD-10-CM

## 2015-08-30 DIAGNOSIS — D696 Thrombocytopenia, unspecified: Secondary | ICD-10-CM | POA: Diagnosis not present

## 2015-08-30 LAB — POCT CBC
Granulocyte percent: 66.6 %G (ref 37–80)
HCT, POC: 42.2 % (ref 37.7–47.9)
HEMOGLOBIN: 15.1 g/dL (ref 12.2–16.2)
LYMPH, POC: 0.8 (ref 0.6–3.4)
MCH, POC: 31.2 pg (ref 27–31.2)
MCHC: 35.8 g/dL — AB (ref 31.8–35.4)
MCV: 87.2 fL (ref 80–97)
MID (CBC): 0.2 (ref 0–0.9)
MPV: 7.4 fL (ref 0–99.8)
POC Granulocyte: 2.1 (ref 2–6.9)
POC LYMPH PERCENT: 26.5 %L (ref 10–50)
POC MID %: 6.9 %M (ref 0–12)
Platelet Count, POC: 119 10*3/uL — AB (ref 142–424)
RBC: 4.85 M/uL (ref 4.04–5.48)
RDW, POC: 13.7 %
WBC: 3.1 10*3/uL — AB (ref 4.6–10.2)

## 2015-08-30 LAB — GLUCOSE, POCT (MANUAL RESULT ENTRY): POC Glucose: 132 mg/dl — AB (ref 70–99)

## 2015-08-30 MED ORDER — BENZONATATE 100 MG PO CAPS: 100.0000 mg | ORAL_CAPSULE | Freq: Three times a day (TID) | ORAL | Status: DC | PRN

## 2015-08-30 MED ORDER — AZITHROMYCIN 250 MG PO TABS: ORAL_TABLET | ORAL | Status: DC

## 2015-08-30 NOTE — Patient Instructions (Addendum)
   IF you received an x-ray today, you will receive an invoice from [facility]. Please contact [facility] at [phone] with questions or concerns regarding your invoice.   IF you received labwork today, you will receive an invoice from [facility]. Please contact [facility] at [phone] with questions or concerns regarding your invoice.   Our billing staff will not be able to assist you with questions regarding bills from these companies.  You will be contacted with the lab results as soon as they are available. The fastest way to get your results is to activate your My Chart account. Instructions are located on the last page of this paperwork. If you have not heard from us regarding the results in 2 weeks, please contact this office.    Acute Bronchitis Bronchitis is inflammation of the airways that extend from the windpipe into the lungs (bronchi). The inflammation often causes mucus to develop. This leads to a cough, which is the most common symptom of bronchitis.  In acute bronchitis, the condition usually develops suddenly and goes away over time, usually in a couple weeks. Smoking, allergies, and asthma can make bronchitis worse. Repeated episodes of bronchitis may cause further lung problems.  CAUSES Acute bronchitis is most often caused by the same virus that causes a cold. The virus can spread from person to person (contagious) through coughing, sneezing, and touching contaminated objects. SIGNS AND SYMPTOMS   Cough.   Fever.   Coughing up mucus.   Body aches.   Chest congestion.   Chills.   Shortness of breath.   Sore throat.  DIAGNOSIS  Acute bronchitis is usually diagnosed through a physical exam. Your health care provider will also ask you questions about your medical history. Tests, such as chest X-rays, are sometimes done to rule out other conditions.  TREATMENT  Acute bronchitis usually goes away in a  couple weeks. Oftentimes, no medical treatment is necessary. Medicines are sometimes given for relief of fever or cough. Antibiotic medicines are usually not needed but may be prescribed in certain situations. In some cases, an inhaler may be recommended to help reduce shortness of breath and control the cough. A cool mist vaporizer may also be used to help thin bronchial secretions and make it easier to clear the chest.  HOME CARE INSTRUCTIONS  Get plenty of rest.   Drink enough fluids to keep your urine clear or pale yellow (unless you have a medical condition that requires fluid restriction). Increasing fluids may help thin your respiratory secretions (sputum) and reduce chest congestion, and it will prevent dehydration.   Take medicines only as directed by your health care provider.  If you were prescribed an antibiotic medicine, finish it all even if you start to feel better.  Avoid smoking and secondhand smoke. Exposure to cigarette smoke or irritating chemicals will make bronchitis worse. If you are a smoker, consider using nicotine gum or skin patches to help control withdrawal symptoms. Quitting smoking will help your lungs heal faster.   Reduce the chances of another bout of acute bronchitis by washing your hands frequently, avoiding people with cold symptoms, and trying not to touch your hands to your mouth, nose, or eyes.   Keep all follow-up visits as directed by your health care provider.  SEEK MEDICAL CARE IF: Your symptoms do not improve after 1 week of treatment.  SEEK IMMEDIATE MEDICAL CARE IF:  You develop an increased fever or chills.   You have chest pain.     You have severe shortness of breath.  You have bloody sputum.   You develop dehydration.  You faint or repeatedly feel like you are going to pass out.  You develop repeated vomiting.  You develop a severe headache. MAKE SURE YOU:   Understand these instructions.  Will watch your  condition.  Will get help right away if you are not doing well or get worse.   This information is not intended to replace advice given to you by your health care provider. Make sure you discuss any questions you have with your health care provider.   Document Released: 06/16/2004 Document Revised: 05/30/2014 Document Reviewed: 10/30/2012 Elsevier Interactive Patient Education 2016 [employer]  Leukopenia Leukopenia is a condition in which you have a low number of white blood cells. White blood cells help your body fight infections. The number of white blood cells in the body varies from person to person. Leukopenia is usually defined as having fewer than 4,000 white blood cells in 1 microliter of blood. There are five types of white blood cells. Two types make up most of your white blood cell count. These are neutrophils and lymphocytes. When your level of neutrophils is low, it is called neutropenia. When your lymphocytes are low, it is called lymphocytopenia. Neutropenia is the most dangerous type of leukopenia because it can lead to dangerous infections. CAUSES  Most white blood cells are made in the soft tissue inside your bones (bone marrow). Conditions that damage or suppress bone marrow are the most common causes of leukopenia. These include:  Medicine or X-ray treatments for cancer.  Serious infections.  Cancer of the white blood cells (leukemia or myeloma).  Medicines, including antibiotics, cardiac drugs, steroids, and those used to treat rheumatoid arthritis. Leukopenia also happens when white blood cells are destroyed after leaving your bone marrow. Causes may include:  Liver disease.  Diseases of the immune system (autoimmune disease).  Vitamin B deficiencies. SIGNS AND SYMPTOMS One of the most common signs of leukopenia, especially severe neutropenia, is having a lot of bacterial infections. Different infections have different symptoms. An infection in your lungs may  cause coughing. A urinary tract infection may cause frequent urination and a burning sensation. You may also get infections of the blood, skin, rectum, throat, sinus, or ear. General signs and symptoms of leukopenia include:  Fever.  Fatigue.  Swollen glands (lymph nodes).  Painful mouth ulcers.  Gum disease. DIAGNOSIS  Your health care provider can diagnose leukopenia based on a physical exam and the results of lab tests. During a physical exam, your health care provider will feel for swollen lymph nodes and check whether your spleen is enlarged. Your spleen is an organ on the left side of your body that stores white blood cells. Tests that may be done include:  A complete blood count. This blood test counts each type of white cell.  Bone marrow aspiration. Some bone marrow is removed to be checked under a microscope.  Lymph node biopsy. Some lymph node tissue is removed to be checked under a microscope.  Other types of blood tests or imaging tests. TREATMENT  Treatment of leukopenia depends on the cause. Some common treatments include:  Antibiotics for bacterial infections.  No longer taking medicines that may cause leukopenia.  Vitamin B supplements.  Medicines to stimulate neutrophil production (hematopoietic growth factors) for neutropenia. HOME CARE INSTRUCTIONS  Preventing infection is important if you have leukopenia.  Avoid sick friends and family members.  Wash your hands often.  Do noteat uncooked or undercooked meats.  Wash fruits and vegetables.  Do not eat or drink unpasteurized dairy products.  Get regular dental care, and maintain good dental hygiene.  Keep all follow-up appointments. SEEK MEDICAL CARE IF:  You have chills or a fever.  You have signs or symptoms of infection. SEEK IMMEDIATE MEDICAL CARE IF:  You have a fever or persistent symptoms for more than 2-3 days.  You have trouble breathing.  You have chest pain. MAKE SURE  YOU:  Understand these instructions.  Will watch your condition.  Will get help right away if you are not doing well or get worse.   This information is not intended to replace advice given to you by your health care provider. Make sure you discuss any questions you have with your health care provider.   Document Released: 05/14/2013 Document Reviewed: 05/14/2013 Elsevier Interactive Patient Education [employer].

## 2015-08-30 NOTE — Progress Notes (Signed)
Subjective:    Patient ID: Annette Farmer, female    DOB: [birth year], 70 y.o.   MRN: [id number] By signing my name below, I, Judithe Modest, attest that this documentation has been prepared under the direction and in the presence of Delman Cheadle, MD. Electronically Signed: Judithe Modest, ER Scribe. 08/30/2015. 4:24 PM.  Chief Complaint  Patient presents with  . Cough    Non productive, pt believes related to paint  . Nasal Congestion  . Sore Throat    HPI HPI Comments: Annette Farmer is a 70 y.o. female with a past hx of type 2 DM, and stroke who presents to [facility] complaining of nonproductive cough, nasal congestion, fatigue, wheezing, weakness, and sore throat for the las five days. She also endorses mild intermittent chest tightness.  This is not limited to during cough and does not have any provoking events or associated symptoms. She has some mild rib pain due to persistent coughing. Her sleep has been disturbed due to persistent wheezing. She is having normal BM and urination.  She denies SOB, or trial of any OTC medications. She denies new leg swelling. She denies diaphoresis. She never lies flat to sleep as has a craft-o-matic adjustable bed. She has had to have her body more propped up than normal o/n.   She identifies her PCP as Dr. Joseph Art and is aware of his retirement.  Pt upset because when she was hospitalized 5 years ago the doctors didn't listen to her - they kept asking her what she did that day and she told them she want to [place] and then the chinese buffet but because they didn't listen to her they assumed she was at [employer] and did not pick up that she went to [place] to connect to the wifi to work on her computer. . . . .  Pt angry about this and rants about this sev times when I ask her to RTC for wellcheck for chronic med refills in several weeks. Pt unable to provide ua due to incontinence.  She was prev on metformin for type 2 DM H/o vertigo  Did not  get flu shot this yr  Past Medical History  Diagnosis Date  . Diabetes mellitus without complication ([place])   . Hypertension   . Depression   . Stroke [facility])    Allergies  Allergen Reactions  . Penicillins Nausea Only   Current Outpatient Prescriptions on File Prior to Visit  Medication Sig Dispense Refill  . amLODipine (NORVASC) 5 MG tablet One daily 90 tablet 3  . aspirin 325 MG tablet Take 325 mg by mouth daily.    Marland Kitchen gabapentin (NEURONTIN) 300 MG capsule TAKE 1 CAPSULE (300 MG TOTAL) BY MOUTH AT BEDTIME. 90 capsule 2  . metoprolol succinate (TOPROL-XL) 25 MG 24 hr tablet Take 1 tablet (25 mg total) by mouth daily. 90 tablet 3  . Glucose Blood (BLOOD GLUCOSE TEST STRIPS) STRP Apply 1 application topically every morning. (Patient not taking: Reported on 08/30/2015) 100 each 2   No current facility-administered medications on file prior to visit.    Review of Systems  Constitutional: Positive for activity change, appetite change and fatigue. Negative for fever, chills, diaphoresis and unexpected weight change.  HENT: Positive for congestion, rhinorrhea and sore throat. Negative for hearing loss, postnasal drip, sinus pressure and trouble swallowing.   Respiratory: Positive for cough, chest tightness and wheezing. Negative for choking and shortness of breath.   Cardiovascular: Positive for  chest pain. Negative for palpitations and leg swelling.  Gastrointestinal: Negative for vomiting.  Genitourinary: Positive for enuresis.  Musculoskeletal: Positive for arthralgias and gait problem.  Allergic/Immunologic: Negative for environmental allergies and immunocompromised state.  Neurological: Positive for weakness, light-headedness and numbness. Negative for dizziness.  Hematological: Negative for adenopathy.  Psychiatric/Behavioral: Positive for sleep disturbance and agitation.      Objective:  BP 108/72 mmHg  Pulse 66  Temp(Src) 97.5 F (36.4 C) (Oral)  Resp 16  Ht 5\' 1"  (1.549 m)   Wt 163 lb (73.936 kg)  BMI 30.81 kg/m2  SpO2 96%  Physical Exam  Constitutional: She is oriented to person, place, and time. She appears well-developed and well-nourished. No distress.  HENT:  Head: Normocephalic and atraumatic.  Right Ear: Tympanic membrane, external ear and ear canal normal.  Left Ear: Tympanic membrane, external ear and ear canal normal.  Nose: Nose normal. No mucosal edema or rhinorrhea.  Mouth/Throat: Uvula is midline, oropharynx is clear and moist and mucous membranes are normal.  TMs, nares, and oropharnyx normal.   Eyes: Pupils are equal, round, and reactive to light.  Neck: Neck supple.  Some tonsillar adenopathy.   Cardiovascular: Normal rate and regular rhythm.   Heart sounds difficult to hear under breath sounds  Pulmonary/Chest: Effort normal. No respiratory distress. She has rales in the right lower field and the left lower field.  Few inspiratory bibasilar rales which cleared after repeated deep breathing.  Musculoskeletal: Normal range of motion.  Neurological: She is alert and oriented to person, place, and time. Coordination normal.  Skin: Skin is warm and dry. She is not diaphoretic.  Psychiatric: She has a normal mood and affect. Her behavior is normal.  Nursing note and vitals reviewed.     Results for orders placed or performed in visit on 08/30/15  POCT CBC  Result Value Ref Range   WBC 3.1 (A) 4.6 - 10.2 K/uL   Lymph, poc 0.8 0.6 - 3.4   POC LYMPH PERCENT 26.5 10 - 50 %L   MID (cbc) 0.2 0 - 0.9   POC MID % 6.9 0 - 12 %M   POC Granulocyte 2.1 2 - 6.9   Granulocyte percent 66.6 37 - 80 %G   RBC 4.85 4.04 - 5.48 M/uL   Hemoglobin 15.1 12.2 - 16.2 g/dL   HCT, POC 42.2 37.7 - 47.9 %   MCV 87.2 80 - 97 fL   MCH, POC 31.2 27 - 31.2 pg   MCHC 35.8 (A) 31.8 - 35.4 g/dL   RDW, POC 13.7 %   Platelet Count, POC 119 (A) 142 - 424 K/uL   MPV 7.4 0 - 99.8 fL  POCT glucose (manual entry)  Result Value Ref Range   POC Glucose 132 (A) 70 - 99  mg/dl   Dg Chest 2 View  08/30/2015  CLINICAL DATA:  nonproductive cough, nasal congestion, fatigue, wheezing and sore throat for the las five days. She also endorses mild chest tightness. She has some mild rib pain due to persistent coughing. Her sleep has been disturbed due to persistent wheezing. EXAM: CHEST  2 VIEW COMPARISON:  01/09/2011 FINDINGS: Cardiac silhouette is normal in size and configuration. Aorta is mildly uncoiled. No mediastinal or hilar masses or evidence of adenopathy. Mild streaky opacity at the left lung base consistent with atelectasis. Lungs are otherwise clear. No pleural effusion or pneumothorax. Bony thorax is intact. IMPRESSION: No active cardiopulmonary disease. Electronically Signed   By: Dedra Skeens.D.  On: 08/30/2015 16:48    [facility] reading (PRIMARY) by  Dr. Brigitte Pulse. EKG: progressive low voltage changes, poor r wave progression, increased axis rotation  Assessment & Plan:   1. Cough   2. Type 2 diabetes mellitus with complication, without long-term current use of insulin (HCC)   3. Leukopenia - new - send confirmatory cbc to [facility] with smear. Recheck at f/u in 3-4 wks. Hopefully due to viral repiratory infection.  4. Thrombocytopenia ([place])   5. Acute bronchitis, unspecified organism   6. Atelectasis - suggested ways of pulmonary toileting to decrease likelihood of complications such as pna - pt rolling her eyes and states she will not do this.  7. Nonspecific abnormal electrocardiogram (ECG) (EKG) - worsening low voltage changes and axis rotation. Pt with h/o cva and reports of chest tightness.  Needs to rtc for further eval prior to refilling chronic HTN meds and consider need for cardiology eval.   I am concerned about pt.  She has not not been seen here in 15 months. She requests a full year of her rx refills today at the end our visit for her acute illness and is upset that I do not feel safe giving them to her as she straightforwardly tells me that she does  not want to come back for at least a year. Has a h/o CVA in 2012 and type 2 DM as well as HTN and HPL so I do not feel comfortable refilling her chronic BP medications when her visit today was problem focused for an acute illness.  It appears pt is living alone and I am concerned about her safety - during the visit, some of the things she says to not make any sense to me and she is very unsteady - she needs a CMA help to get dressed after the visit, she uses a one-point cane but with sig ataxia where someone had to walk her out to the car (though pt tried to refuse this). She is a little hypotensive and is a high fall risk.  I am not sure where her baseline is but considering the other minor abnormalities on exam I do think it warrants at minimum recheck of pt within several weeks prior to providing long-term refill of chronic meds.  She should have ran out of both her toprol 25 and amlodipine 5 3 mos prior but pt reports that she got a 90d fill in February so has another month of these left.  Currently I would suggest that she decrease toprol in half and poss same with amlodipine.     Orders Placed This Encounter  Procedures  . DG Chest 2 View    Standing Status: Future     Number of Occurrences: 1     Standing Expiration Date: 08/29/2016    Order Specific Question:  Reason for Exam (SYMPTOM  OR DIAGNOSIS REQUIRED)    Answer:  cough, chest tightness, bibasilar insp rales    Order Specific Question:  Preferred imaging location?    Answer:  External  . CBC with Differential/Platelet  . Pathologist smear review  . Sedimentation Rate  . POCT CBC  . POCT glucose (manual entry)  . EKG 12-Lead    Meds ordered this encounter  Medications  . azithromycin (ZITHROMAX) 250 MG tablet    Sig: Take 2 tabs PO x 1 dose, then 1 tab PO QD x 4 days    Dispense:  6 tablet    Refill:  0  . benzonatate (TESSALON) 100  MG capsule    Sig: Take 1-2 capsules (100-200 mg total) by mouth 3 (three) times daily as needed  for cough.    Dispense:  40 capsule    Refill:  0    I personally performed the services described in this documentation, which was scribed in my presence. The recorded information has been reviewed and considered, and addended by me as needed.  Delman Cheadle, MD MPH

## 2015-08-31 ENCOUNTER — Other Ambulatory Visit: Payer: Self-pay

## 2015-08-31 LAB — CBC WITH DIFFERENTIAL/PLATELET
BASOS PCT: 0 %
Basophils Absolute: 0 cells/uL (ref 0–200)
EOS PCT: 3 %
Eosinophils Absolute: 87 cells/uL (ref 15–500)
HCT: 44.3 % (ref 35.0–45.0)
Hemoglobin: 15.2 g/dL (ref 11.7–15.5)
LYMPHS PCT: 25 %
Lymphs Abs: 725 cells/uL — ABNORMAL LOW (ref 850–3900)
MCH: 30.2 pg (ref 27.0–33.0)
MCHC: 34.3 g/dL (ref 32.0–36.0)
MCV: 87.9 fL (ref 80.0–100.0)
MONOS PCT: 12 %
MPV: 10 fL (ref 7.5–12.5)
Monocytes Absolute: 348 cells/uL (ref 200–950)
Neutro Abs: 1740 cells/uL (ref 1500–7800)
Neutrophils Relative %: 60 %
PLATELETS: 148 10*3/uL (ref 140–400)
RBC: 5.04 MIL/uL (ref 3.80–5.10)
RDW: 14 % (ref 11.0–15.0)
WBC: 2.9 10*3/uL — AB (ref 3.8–10.8)

## 2015-08-31 MED ORDER — BENZONATATE 100 MG PO CAPS: 100.0000 mg | ORAL_CAPSULE | Freq: Three times a day (TID) | ORAL | Status: DC | PRN

## 2015-08-31 MED ORDER — AZITHROMYCIN 250 MG PO TABS: ORAL_TABLET | ORAL | Status: DC

## 2015-09-01 LAB — SEDIMENTATION RATE: SED RATE: 6 mm/h (ref 0–30)

## 2015-09-02 LAB — PATHOLOGIST SMEAR REVIEW

## 2015-09-11 ENCOUNTER — Ambulatory Visit (INDEPENDENT_AMBULATORY_CARE_PROVIDER_SITE_OTHER): Payer: Medicare Other | Admitting: Physician Assistant

## 2015-09-11 VITALS — BP 114/80 | HR 66 | Temp 97.9°F | Resp 18 | Ht 61.0 in | Wt 169.0 lb

## 2015-09-11 DIAGNOSIS — I10 Essential (primary) hypertension: Secondary | ICD-10-CM | POA: Diagnosis not present

## 2015-09-11 DIAGNOSIS — Z760 Encounter for issue of repeat prescription: Secondary | ICD-10-CM | POA: Diagnosis not present

## 2015-09-11 MED ORDER — GABAPENTIN 300 MG PO CAPS: 300.0000 mg | ORAL_CAPSULE | Freq: Every day | ORAL | Status: DC

## 2015-09-11 MED ORDER — METOPROLOL SUCCINATE ER 25 MG PO TB24: 25.0000 mg | ORAL_TABLET | Freq: Every day | ORAL | Status: DC

## 2015-09-11 MED ORDER — AMLODIPINE BESYLATE 5 MG PO TABS: ORAL_TABLET | ORAL | Status: DC

## 2015-09-11 NOTE — Patient Instructions (Signed)
     IF you received an x-ray today, you will receive an invoice from [facility]. Please contact  Radiology at [phone] with questions or concerns regarding your invoice.   IF you received labwork today, you will receive an invoice from [facility]. Please contact [facility] at [phone] with questions or concerns regarding your invoice.   Our billing staff will not be able to assist you with questions regarding bills from these companies.  You will be contacted with the lab results as soon as they are available. The fastest way to get your results is to activate your My Chart account. Instructions are located on the last page of this paperwork. If you have not heard from us regarding the results in 2 weeks, please contact this office.      

## 2015-09-11 NOTE — Progress Notes (Signed)
09/11/2015 5:05 PM   DOB: [birth year] / MRN: [id number]  SUBJECTIVE:  Annette Farmer is a 70 y.o. female presenting for medication refills.  Reports she feels well and has no complaints.  Denies chest pain, cough, leg swelling and cough.  She would like refills of her metoprolol, norvasc and gabapentin. She has a history of DM2 but was advised by Dr. Joseph Art to stop taking her metformin.  CHL reveals a non fating glucose of 136 and an A1c of 6.2 one year ago.   She is allergic to penicillins.   She  has a past medical history of Diabetes mellitus without complication ([place]); Hypertension; Depression; and Stroke ([place]).    She  reports that she has never smoked. She does not have any smokeless tobacco history on file. She  has no sexual activity history on file. The patient  has no past surgical history on file.  Her family history is not on file.  Review of Systems  Constitutional: Negative for fever and chills.  Eyes: Negative for blurred vision.  Respiratory: Negative for cough and shortness of breath.   Cardiovascular: Negative for chest pain.  Gastrointestinal: Negative for nausea and abdominal pain.  Genitourinary: Negative for dysuria, urgency and frequency.  Musculoskeletal: Negative for myalgias.  Skin: Negative for rash.  Neurological: Negative for dizziness, tingling and headaches.  Psychiatric/Behavioral: Negative for depression. The patient is not nervous/anxious.     Problem list and medications reviewed and updated by myself where necessary, and exist elsewhere in the encounter.   OBJECTIVE:  BP 114/80 mmHg  Pulse 66  Temp(Src) 97.9 F (36.6 C) (Oral)  Resp 18  Ht 5\' 1"  (1.549 m)  Wt 169 lb (76.658 kg)  BMI 31.95 kg/m2  SpO2 98%  Physical Exam  Constitutional: She is oriented to person, place, and time.  Cardiovascular: Normal rate, regular rhythm and normal heart sounds.   No murmur heard. Pulmonary/Chest: Effort normal and breath sounds normal. She has  no wheezes. She has no rales.  Abdominal: Bowel sounds are normal.  Neurological: She is alert and oriented to person, place, and time. No cranial nerve deficit.  Skin: Skin is warm and dry.  Psychiatric: She has a normal mood and affect. Her behavior is normal. Judgment and thought content normal.  Vitals reviewed.   No results found for this or any previous visit (from the past 72 hour(s)).  No results found.  ASSESSMENT AND PLAN  Jazale was seen today for medication refill.  Diagnoses and all orders for this visit:  Encounter for medication refill: I have reviewed Dr. Raul Del note from previous and it appears that she is doing much better today. Her thought process is linear and logical.  She can clearly recall the medications she is taking and what she needs today.  Her gait is steady.  It may be possible that she was acting strangely during Dr. Raul Del visit due to acute illness and/or fatigue.  I will refill her medications for 6 months today, and she will need to return to clinic in about 5 months. Patient informed of this and is agreeable.   Will recheck her labs as documented in Dr. Raul Del note.        -     CBC with differential       -     Hemoglobin A1C       -     Basic metabolic panel -     amLODipine (NORVASC) 5 MG tablet; One daily -  gabapentin (NEURONTIN) 300 MG capsule; Take 1 capsule (300 mg total) by mouth at bedtime. -     metoprolol succinate (TOPROL-XL) 25 MG 24 hr tablet; Take 1 tablet (25 mg total) by mouth daily.    The patient was advised to call or return to clinic if she does not see an improvement in symptoms or to seek the care of the closest emergency department if she worsens with the above plan.   Philis Fendt, MHS, PA-C [facility] 09/11/2015 5:05 PM

## 2015-09-12 LAB — CBC WITH DIFFERENTIAL/PLATELET
BASOS ABS: 0 {cells}/uL (ref 0–200)
BASOS PCT: 0 %
EOS PCT: 1 %
Eosinophils Absolute: 79 cells/uL (ref 15–500)
HCT: 44.1 % (ref 35.0–45.0)
HEMOGLOBIN: 14.4 g/dL (ref 11.7–15.5)
LYMPHS ABS: 1264 {cells}/uL (ref 850–3900)
Lymphocytes Relative: 16 %
MCH: 29.6 pg (ref 27.0–33.0)
MCHC: 32.7 g/dL (ref 32.0–36.0)
MCV: 90.7 fL (ref 80.0–100.0)
MONOS PCT: 7 %
MPV: 9.9 fL (ref 7.5–12.5)
Monocytes Absolute: 553 cells/uL (ref 200–950)
NEUTROS ABS: 6004 {cells}/uL (ref 1500–7800)
Neutrophils Relative %: 76 %
PLATELETS: 310 10*3/uL (ref 140–400)
RBC: 4.86 MIL/uL (ref 3.80–5.10)
RDW: 13.6 % (ref 11.0–15.0)
WBC: 7.9 10*3/uL (ref 3.8–10.8)

## 2015-09-12 LAB — BASIC METABOLIC PANEL
BUN: 18 mg/dL (ref 7–25)
CALCIUM: 9.5 mg/dL (ref 8.6–10.4)
CHLORIDE: 102 mmol/L (ref 98–110)
CO2: 25 mmol/L (ref 20–31)
CREATININE: 0.88 mg/dL (ref 0.60–0.93)
Glucose, Bld: 140 mg/dL — ABNORMAL HIGH (ref 65–99)
Potassium: 4.7 mmol/L (ref 3.5–5.3)
SODIUM: 138 mmol/L (ref 135–146)

## 2015-09-12 LAB — HEMOGLOBIN A1C
HEMOGLOBIN A1C: 6.6 % — AB (ref <5.7)
MEAN PLASMA GLUCOSE: 143 mg/dL

## 2015-09-24 ENCOUNTER — Other Ambulatory Visit: Payer: Self-pay | Admitting: Physician Assistant

## 2015-09-24 MED ORDER — METFORMIN HCL 500 MG PO TABS: 500.0000 mg | ORAL_TABLET | Freq: Every day | ORAL | Status: DC

## 2015-09-30 ENCOUNTER — Telehealth: Payer: Self-pay

## 2015-09-30 NOTE — Telephone Encounter (Signed)
I dont believe so

## 2015-09-30 NOTE — Telephone Encounter (Signed)
Patient is wondering if metformin has a diuretic effect. The phone was fuzzy but the patient said something about constantly having to have her diaper changed. Please advise! [phone]

## 2015-10-02 NOTE — Telephone Encounter (Signed)
Not a diuretic effect, but DIARRHEA is a common adverse effect of metformin.  She took this medication without problems previously, so if she can try to stick with it for a few weeks, it may resolve. If not, let us know, and we can consider alternatives (oftentimes, the XR formulation resolves this issue).

## 2015-10-02 NOTE — Telephone Encounter (Signed)
Pt advised.

## 2016-01-27 ENCOUNTER — Ambulatory Visit (INDEPENDENT_AMBULATORY_CARE_PROVIDER_SITE_OTHER): Payer: Medicare Other | Admitting: Family Medicine

## 2016-01-27 VITALS — BP 122/72 | HR 84 | Temp 98.0°F | Resp 17 | Ht 61.5 in | Wt 167.0 lb

## 2016-01-27 DIAGNOSIS — E119 Type 2 diabetes mellitus without complications: Secondary | ICD-10-CM | POA: Diagnosis not present

## 2016-01-27 DIAGNOSIS — A09 Infectious gastroenteritis and colitis, unspecified: Secondary | ICD-10-CM | POA: Diagnosis not present

## 2016-01-27 DIAGNOSIS — R197 Diarrhea, unspecified: Secondary | ICD-10-CM

## 2016-01-27 DIAGNOSIS — R21 Rash and other nonspecific skin eruption: Secondary | ICD-10-CM | POA: Diagnosis not present

## 2016-01-27 LAB — POCT CBC
GRANULOCYTE PERCENT: 83 % — AB (ref 37–80)
HCT, POC: 41.9 % (ref 37.7–47.9)
Hemoglobin: 14.8 g/dL (ref 12.2–16.2)
Lymph, poc: 1.3 (ref 0.6–3.4)
MCH: 31.2 pg (ref 27–31.2)
MCHC: 35.3 g/dL (ref 31.8–35.4)
MCV: 88.2 fL (ref 80–97)
MID (cbc): 0.2 (ref 0–0.9)
MPV: 8.1 fL (ref 0–99.8)
PLATELET COUNT, POC: 216 10*3/uL (ref 142–424)
POC Granulocyte: 7.1 — AB (ref 2–6.9)
POC LYMPH %: 14.7 % (ref 10–50)
POC MID %: 2.3 %M (ref 0–12)
RBC: 4.74 M/uL (ref 4.04–5.48)
RDW, POC: 14 %
WBC: 8.6 10*3/uL (ref 4.6–10.2)

## 2016-01-27 LAB — POCT URINALYSIS DIP (MANUAL ENTRY)
Bilirubin, UA: NEGATIVE
Glucose, UA: NEGATIVE
Nitrite, UA: POSITIVE — AB
SPEC GRAV UA: 1.025
Urobilinogen, UA: 0.2
pH, UA: 5.5

## 2016-01-27 LAB — GLUCOSE, POCT (MANUAL RESULT ENTRY): POC Glucose: 126 mg/dl — AB (ref 70–99)

## 2016-01-27 MED ORDER — TRIAMCINOLONE ACETONIDE 0.1 % EX CREA: 1.0000 "application " | TOPICAL_CREAM | Freq: Two times a day (BID) | CUTANEOUS | 0 refills | Status: DC

## 2016-01-27 MED ORDER — CIPROFLOXACIN HCL 500 MG PO TABS: 500.0000 mg | ORAL_TABLET | Freq: Two times a day (BID) | ORAL | 0 refills | Status: DC

## 2016-01-27 NOTE — Patient Instructions (Addendum)
       IF you received an x-ray today, you will receive an invoice from [facility]. Please contact [facility] at [phone] with questions or concerns regarding your invoice.   IF you received labwork today, you will receive an invoice from [facility]. Please contact [facility] at [phone] with questions or concerns regarding your invoice.   Our billing staff will not be able to assist you with questions regarding bills from these companies.  You will be contacted with the lab results as soon as they are available. The fastest way to get your results is to activate your My Chart account. Instructions are located on the last page of this paperwork. If you have not heard from us regarding the results in 2 weeks, please contact this office.     Diarrhea Diarrhea is frequent loose and watery bowel movements. It can cause you to feel weak and dehydrated. Dehydration can cause you to become tired and thirsty, have a dry mouth, and have decreased urination that often is dark yellow. Diarrhea is a sign of another problem, most often an infection that will not last long. In most cases, diarrhea typically lasts 2-3 days. However, it can last longer if it is a sign of something more serious. It is important to treat your diarrhea as directed by your caregiver to lessen or prevent future episodes of diarrhea. CAUSES  Some common causes include:  Gastrointestinal infections caused by viruses, bacteria, or parasites.  Food poisoning or food allergies.  Certain medicines, such as antibiotics, chemotherapy, and laxatives.  Artificial sweeteners and fructose.  Digestive disorders. HOME CARE INSTRUCTIONS  Ensure adequate fluid intake (hydration): Have 1 cup (8 oz) of fluid for each diarrhea episode. Avoid fluids that contain simple sugars or sports drinks, fruit juices, whole milk products, and sodas. Your urine should be clear or pale yellow if you  are drinking enough fluids. Hydrate with an oral rehydration solution that you can purchase at pharmacies, retail stores, and online. You can prepare an oral rehydration solution at home by mixing the following ingredients together:   - tsp table salt.   tsp baking soda.   tsp salt substitute containing potassium chloride.  1  tablespoons sugar.  1 L (34 oz) of water.  Certain foods and beverages may increase the speed at which food moves through the gastrointestinal (GI) tract. These foods and beverages should be avoided and include:  Caffeinated and alcoholic beverages.  High-fiber foods, such as raw fruits and vegetables, nuts, seeds, and whole grain breads and cereals.  Foods and beverages sweetened with sugar alcohols, such as xylitol, sorbitol, and mannitol.  Some foods may be well tolerated and may help thicken stool including:  Starchy foods, such as rice, toast, pasta, low-sugar cereal, oatmeal, grits, baked potatoes, crackers, and bagels.  Bananas.  Applesauce.  Add probiotic-rich foods to help increase healthy bacteria in the GI tract, such as yogurt and fermented milk products.  Wash your hands well after each diarrhea episode.  Only take over-the-counter or prescription medicines as directed by your caregiver.  Take a warm bath to relieve any burning or pain from frequent diarrhea episodes. SEEK IMMEDIATE MEDICAL CARE IF:   You are unable to keep fluids down.  You have persistent vomiting.  You have blood in your stool, or your stools are black and tarry.  You do not urinate in 6-8 hours, or there is only a small amount of very dark urine.  You have abdominal   pain that increases or localizes.  You have weakness, dizziness, confusion, or light-headedness.  You have a severe headache.  Your diarrhea gets worse or does not get better.  You have a fever or persistent symptoms for more than 2-3 days.  You have a fever and your symptoms suddenly get  worse. MAKE SURE YOU:   Understand these instructions.  Will watch your condition.  Will get help right away if you are not doing well or get worse.   This information is not intended to replace advice given to you by your health care provider. Make sure you discuss any questions you have with your health care provider.   Document Released: 04/29/2002 Document Revised: 05/30/2014 Document Reviewed: 01/15/2012 Elsevier Interactive Patient Education 2016 [employer]  

## 2016-01-27 NOTE — Progress Notes (Signed)
Subjective:  By signing my name below, I, Moises Blood, attest that this documentation has been prepared under the direction and in the presence of Reginia Forts, MD. Electronically Signed: Moises Blood, [place]. 01/27/2016 , 5:35 PM .  Patient was seen in Room 2 .   Patient ID: Annette Farmer, female    DOB: 1945-07-22, 70 y.o.   MRN: [id number]  01/27/2016  Diarrhea   HPI Annette Farmer is a 70 y.o. female who presents to [facility] complaining of diarrhea that started about a month ago. She went to [place] to help clean her aunt's retirement home. She was eating their food and wasn't able to pass a bowel movement for about 10 days. She took a stool softener and started to pass bowels more. It worsened in the past week; she has bed pans because of urinary urgency and has filled those up. She has about 3 watery diarrhea a day that would catch her off guard. She noticed black stools but she's taken pepto bismol and imodium recently. Her last colonoscopy was many years ago. She's been able to eat: rotisserie chicken and cheerios yesterday, a burger the day before, and rotisserie chicken the day prior. She denies fever, chills, sweats, nausea, vomiting or blood in her stool. She denies history of diverticulitis.   She also mentions there are itchy bites around her body and this has been spreading. It started over her right shoulder and left upper thigh, initially noticed in July. She uses "All Free and Clear" detergent. She denies having any pets. She had these bites prior to going to her aunt's.   Review of Systems  Constitutional: Negative for appetite change, chills, diaphoresis, fatigue and fever.  Eyes: Negative for visual disturbance.  Respiratory: Negative for cough and shortness of breath.   Cardiovascular: Negative for chest pain, palpitations and leg swelling.  Gastrointestinal: Positive for abdominal pain and diarrhea. Negative for blood in stool, constipation, nausea and vomiting.    Endocrine: Negative for cold intolerance, heat intolerance, polydipsia, polyphagia and polyuria.  Skin: Positive for rash. Negative for wound.  Neurological: Negative for dizziness, tremors, seizures, syncope, facial asymmetry, speech difficulty, weakness, light-headedness, numbness and headaches.    Past Medical History:  Diagnosis Date  . Depression   . Diabetes mellitus without complication ([place])   . Hypertension   . Stroke [facility])    Past Surgical History:  Procedure Laterality Date  . UMBILICAL HERNIA REPAIR     Allergies  Allergen Reactions  . Penicillins Nausea Only    Social History   Social History  . Marital status: Single    Spouse name: N/A  . Number of children: N/A  . Years of education: N/A   Occupational History  . Not on file.   Social History Main Topics  . Smoking status: Never Smoker  . Smokeless tobacco: Not on file  . Alcohol use Not on file  . Drug use: Unknown  . Sexual activity: Not on file   Other Topics Concern  . Not on file   Social History Narrative  . No narrative on file   No family history on file.     Objective:    BP 122/72 (BP Location: Right Arm, Patient Position: Sitting, Cuff Size: Normal)   Pulse 84   Temp 98 F (36.7 C) (Oral)   Resp 17   Ht 5' 1.5" (1.562 m)   Wt 167 lb (75.8 kg)   SpO2 96%   BMI 31.04 kg/m   Physical  Exam  Constitutional: She is oriented to person, place, and time. She appears well-developed and well-nourished. No distress.  HENT:  Head: Normocephalic and atraumatic.  Right Ear: External ear normal.  Left Ear: External ear normal.  Nose: Nose normal.  Mouth/Throat: Oropharynx is clear and moist.  Eyes: Conjunctivae and EOM are normal. Pupils are equal, round, and reactive to light.  Neck: Normal range of motion. Neck supple. Carotid bruit is not present. No thyromegaly present.  Cardiovascular: Normal rate, regular rhythm, normal heart sounds and intact distal pulses.  Exam reveals no  gallop and no friction rub.   No murmur heard. Pulmonary/Chest: Effort normal and breath sounds normal. No respiratory distress. She has no wheezes. She has no rales.  Abdominal: Soft. Bowel sounds are normal. She exhibits no distension and no mass. There is no tenderness. There is no rebound and no guarding.  Has firmness over LLQ  Musculoskeletal: Normal range of motion.  Lymphadenopathy:    She has no cervical adenopathy.  Neurological: She is alert and oriented to person, place, and time. No cranial nerve deficit.  Skin: Skin is warm and dry. Rash noted. She is not diaphoretic. No erythema. No pallor.  Scattered maculopapular rash along arms, legs, upper back with excoriations.  Psychiatric: She has a normal mood and affect. Her behavior is normal.  Nursing note and vitals reviewed.       Assessment & Plan:   1. Diarrhea of presumed infectious origin   2. Type 2 diabetes mellitus without complication, without long-term current use of insulin ([place])   3. Rash and nonspecific skin eruption    -new; send stool studies; treat with Cipro while awaiting cx results. -rx for triamcinolone provided for rash. -obtain labs. Monitor sugars closely with diarrhea. -BRAT diet, hydration.   -if no improvement in upcoming 2 weeks, call for GI referral.    Orders Placed This Encounter  Procedures  . Stool culture  . Ova and parasite examination  . Clostridium Difficile by PCR    Order Specific Question:   Is your patient experiencing loose or watery stools (3 or more in 24 hours)?    Answer:   Yes    Order Specific Question:   Has the patient received laxatives in the last 24 hours?    Answer:   No    Order Specific Question:   Has a negative Cdiff test resulted in the last 7 days?    Answer:   No  . Comprehensive metabolic panel  . Hemoglobin A1c  . POCT urinalysis dipstick  . POCT glucose (manual entry)  . POCT CBC   Meds ordered this encounter  Medications  . ciprofloxacin  (CIPRO) 500 MG tablet    Sig: Take 1 tablet (500 mg total) by mouth 2 (two) times daily.    Dispense:  20 tablet    Refill:  0  . triamcinolone cream (KENALOG) 0.1 %    Sig: Apply 1 application topically 2 (two) times daily.    Dispense:  30 g    Refill:  0    No Follow-up on file.   I personally performed the services described in this documentation, which was scribed in my presence. The recorded information has been reviewed and considered.  Haden Suder Elayne Guerin, M.D. [facility] [address] [place], [place]  [address] [phone] phone [phone] fax 01/27/2016 08:30pm

## 2016-01-28 DIAGNOSIS — A09 Infectious gastroenteritis and colitis, unspecified: Secondary | ICD-10-CM | POA: Diagnosis not present

## 2016-01-28 LAB — COMPREHENSIVE METABOLIC PANEL
ALBUMIN: 3.8 g/dL (ref 3.6–5.1)
ALK PHOS: 87 U/L (ref 33–130)
ALT: 9 U/L (ref 6–29)
AST: 11 U/L (ref 10–35)
BILIRUBIN TOTAL: 0.7 mg/dL (ref 0.2–1.2)
BUN: 18 mg/dL (ref 7–25)
CALCIUM: 9 mg/dL (ref 8.6–10.4)
CO2: 21 mmol/L (ref 20–31)
Chloride: 107 mmol/L (ref 98–110)
Creat: 0.82 mg/dL (ref 0.60–0.93)
Glucose, Bld: 127 mg/dL — ABNORMAL HIGH (ref 65–99)
POTASSIUM: 4.2 mmol/L (ref 3.5–5.3)
Sodium: 141 mmol/L (ref 135–146)
Total Protein: 6.3 g/dL (ref 6.1–8.1)

## 2016-01-29 LAB — HEMOGLOBIN A1C
HEMOGLOBIN A1C: 5.7 % — AB (ref <5.7)
MEAN PLASMA GLUCOSE: 117 mg/dL

## 2016-01-29 LAB — CLOSTRIDIUM DIFFICILE BY PCR: CDIFFPCR: NOT DETECTED

## 2016-01-29 LAB — OVA AND PARASITE EXAMINATION: OP: NONE SEEN

## 2016-02-01 LAB — STOOL CULTURE

## 2016-02-08 ENCOUNTER — Telehealth: Payer: Self-pay

## 2016-02-08 NOTE — Telephone Encounter (Signed)
Patient was put on cipro and states she was told that there's another anti biotic she can try is this one doesn't work. Patient would like to try other one. Please advise!  [place]

## 2016-02-10 NOTE — Telephone Encounter (Signed)
Call to check status of diarrhea?  Has diarrhea improved?  How many stools is she having per day?  Is she having abdominal pain?

## 2016-02-15 NOTE — Telephone Encounter (Signed)
Message left for pt to call back.

## 2016-03-19 ENCOUNTER — Other Ambulatory Visit: Payer: Self-pay | Admitting: Physician Assistant

## 2016-03-19 DIAGNOSIS — Z760 Encounter for issue of repeat prescription: Secondary | ICD-10-CM

## 2016-05-09 ENCOUNTER — Encounter: Payer: Self-pay | Admitting: Family Medicine

## 2016-06-11 ENCOUNTER — Telehealth: Payer: Self-pay

## 2016-06-11 DIAGNOSIS — Z760 Encounter for issue of repeat prescription: Secondary | ICD-10-CM

## 2016-06-11 MED ORDER — METFORMIN HCL 500 MG PO TABS: 500.0000 mg | ORAL_TABLET | Freq: Every day | ORAL | 0 refills | Status: DC

## 2016-06-11 MED ORDER — AMLODIPINE BESYLATE 5 MG PO TABS: 5.0000 mg | ORAL_TABLET | Freq: Every day | ORAL | 0 refills | Status: DC

## 2016-06-11 MED ORDER — METOPROLOL SUCCINATE ER 25 MG PO TB24: 25.0000 mg | ORAL_TABLET | Freq: Every day | ORAL | 0 refills | Status: DC

## 2016-06-11 MED ORDER — GABAPENTIN 300 MG PO CAPS: 300.0000 mg | ORAL_CAPSULE | Freq: Every day | ORAL | 0 refills | Status: DC

## 2016-06-11 NOTE — Telephone Encounter (Signed)
Fax req from [facility] for refills: Metoprolol, Gabapentin, Amlodipine, Metformin  Last labs 01/2016,  Refilled x 3 mo w/note to follow up Mar 2018

## 2016-06-14 NOTE — Telephone Encounter (Signed)
PATIENT WAS ON 3 WAY WITH THE [employer] MAIL-IN REPRESENTATIVE: PATIENT NEEDS TO GET REFILLS ON HER GABAPENTIN 300 MG, METOPROLOL SUCCINATE 25 MG AND AMLODIPINE 5 MG. SHE ONLY HAS 6 PILLS LEFT. SHE SAID SHE HAS REFILLS ON HER METFORMIN. SHE BECAME EFFECTIVE WITH [employer] ON May 23, 2016. [employer] IF QUESTIONS: [phone] (DR. CALL DEPT) FAX [phone] PATIENT'S HOME PHONE IS [phone].  [place]

## 2016-06-15 ENCOUNTER — Telehealth: Payer: Self-pay | Admitting: *Deleted

## 2016-06-15 DIAGNOSIS — Z760 Encounter for issue of repeat prescription: Secondary | ICD-10-CM

## 2016-06-15 NOTE — Telephone Encounter (Signed)
Patient advised prescriptions called in and she will need office visit before they run out.  Patient voiced understanding

## 2016-06-18 MED ORDER — METOPROLOL SUCCINATE ER 25 MG PO TB24: 25.0000 mg | ORAL_TABLET | Freq: Every day | ORAL | 0 refills | Status: DC

## 2016-06-18 MED ORDER — AMLODIPINE BESYLATE 5 MG PO TABS: 5.0000 mg | ORAL_TABLET | Freq: Every day | ORAL | 0 refills | Status: DC

## 2016-06-18 MED ORDER — GABAPENTIN 300 MG PO CAPS: 300.0000 mg | ORAL_CAPSULE | Freq: Every day | ORAL | 0 refills | Status: DC

## 2016-06-18 NOTE — Telephone Encounter (Signed)
Pt called back and needs these at [facility] not [facility], resent

## 2016-06-22 ENCOUNTER — Telehealth: Payer: Self-pay

## 2016-06-22 DIAGNOSIS — Z760 Encounter for issue of repeat prescription: Secondary | ICD-10-CM

## 2016-06-22 MED ORDER — GABAPENTIN 300 MG PO CAPS: 300.0000 mg | ORAL_CAPSULE | Freq: Every day | ORAL | 0 refills | Status: DC

## 2016-06-22 NOTE — Telephone Encounter (Signed)
Pt wants meds changed to [place] gabapentin to go to [facility]. Changed 1/1 - prior telephone note states [facility].

## 2016-06-22 NOTE — Telephone Encounter (Signed)
PATIENT NEEDS TO POSSIBLY GET SOME SAMPLES OF ALL HER MEDICATIONS BECAUSE HER [employer] MAIL-IN IS RUNNING BEHIND IN GETTING HER MEDICINE TO HER. SHE IS TOTALLY OUT NOW. BEST PHONE [phone] (HOME) IF NO SAMPLES PLEASE CALL IT INTO [facility]. [place]

## 2016-06-24 MED ORDER — METFORMIN HCL 500 MG PO TABS: 500.0000 mg | ORAL_TABLET | Freq: Every day | ORAL | 0 refills | Status: DC

## 2016-06-24 NOTE — Telephone Encounter (Signed)
All meds were sent to pharmacy this month No samples

## 2016-07-09 ENCOUNTER — Other Ambulatory Visit: Payer: Self-pay

## 2016-07-09 DIAGNOSIS — Z760 Encounter for issue of repeat prescription: Secondary | ICD-10-CM

## 2016-07-09 MED ORDER — AMLODIPINE BESYLATE 5 MG PO TABS: 5.0000 mg | ORAL_TABLET | Freq: Every day | ORAL | 0 refills | Status: DC

## 2016-07-09 MED ORDER — GABAPENTIN 300 MG PO CAPS: 300.0000 mg | ORAL_CAPSULE | Freq: Every day | ORAL | 0 refills | Status: DC

## 2016-07-09 MED ORDER — METOPROLOL SUCCINATE ER 25 MG PO TB24: 25.0000 mg | ORAL_TABLET | Freq: Every day | ORAL | 0 refills | Status: DC

## 2016-09-21 ENCOUNTER — Ambulatory Visit: Payer: Medicare PPO | Admitting: Family Medicine

## 2016-09-27 ENCOUNTER — Ambulatory Visit: Payer: Medicare PPO | Admitting: Family Medicine

## 2016-09-28 ENCOUNTER — Encounter: Payer: Self-pay | Admitting: Family Medicine

## 2016-09-28 ENCOUNTER — Ambulatory Visit (INDEPENDENT_AMBULATORY_CARE_PROVIDER_SITE_OTHER): Payer: Medicare PPO | Admitting: Family Medicine

## 2016-09-28 VITALS — BP 146/80 | HR 92 | Temp 97.8°F | Resp 16 | Ht 61.5 in | Wt 161.6 lb

## 2016-09-28 DIAGNOSIS — Z5329 Procedure and treatment not carried out because of patient's decision for other reasons: Secondary | ICD-10-CM | POA: Insufficient documentation

## 2016-09-28 DIAGNOSIS — N393 Stress incontinence (female) (male): Secondary | ICD-10-CM

## 2016-09-28 DIAGNOSIS — Z5320 Procedure and treatment not carried out because of patient's decision for unspecified reasons: Secondary | ICD-10-CM

## 2016-09-28 DIAGNOSIS — Z1159 Encounter for screening for other viral diseases: Secondary | ICD-10-CM | POA: Diagnosis not present

## 2016-09-28 DIAGNOSIS — Z23 Encounter for immunization: Secondary | ICD-10-CM

## 2016-09-28 DIAGNOSIS — Z760 Encounter for issue of repeat prescription: Secondary | ICD-10-CM

## 2016-09-28 DIAGNOSIS — R002 Palpitations: Secondary | ICD-10-CM | POA: Diagnosis not present

## 2016-09-28 DIAGNOSIS — I10 Essential (primary) hypertension: Secondary | ICD-10-CM

## 2016-09-28 DIAGNOSIS — E7800 Pure hypercholesterolemia, unspecified: Secondary | ICD-10-CM

## 2016-09-28 DIAGNOSIS — E118 Type 2 diabetes mellitus with unspecified complications: Secondary | ICD-10-CM

## 2016-09-28 DIAGNOSIS — I69351 Hemiplegia and hemiparesis following cerebral infarction affecting right dominant side: Secondary | ICD-10-CM | POA: Diagnosis not present

## 2016-09-28 LAB — GLUCOSE, POCT (MANUAL RESULT ENTRY): POC Glucose: 126 mg/dl — AB (ref 70–99)

## 2016-09-28 LAB — POCT GLYCOSYLATED HEMOGLOBIN (HGB A1C): Hemoglobin A1C: 5.9

## 2016-09-28 MED ORDER — LOSARTAN POTASSIUM-HCTZ 50-12.5 MG PO TABS: 1.0000 | ORAL_TABLET | Freq: Every day | ORAL | 3 refills | Status: DC

## 2016-09-28 NOTE — Patient Instructions (Addendum)
  1. Stop GABAPENTIN. 2.  Stop Amlodipine when Losartan/HCTZ 50-12.5.  Start Losartan-HCTZ for blood pressure.    IF you received an x-ray today, you will receive an invoice from [facility]. Please contact [facility] at [phone] with questions or concerns regarding your invoice.   IF you received labwork today, you will receive an invoice from [place]. Please contact [facility] at [phone] with questions or concerns regarding your invoice.   Our billing staff will not be able to assist you with questions regarding bills from these companies.  You will be contacted with the lab results as soon as they are available. The fastest way to get your results is to activate your My Chart account. Instructions are located on the last page of this paperwork. If you have not heard from [place] regarding the results in 2 weeks, please contact this office.

## 2016-09-28 NOTE — Progress Notes (Signed)
Subjective:    Patient ID: Annette Farmer, female    DOB: [birth year], 71 y.o.   MRN: [id number]  09/28/2016  Medication Refill (wants to be check to see if she needs to be still taking them )   HPI  This 71 y.o. female presents for six month follow-up of hypertension, DMII.    Does not check blood pressure at home.  History of CVA 01/02/11; unsteady gait; must use cane.  No focal weakness.  Mild numbness in second toe.  Checks sugar some; runs 120ish last check.  Usually runs 140s.  Metformin once daily. Taking Gabapentin 300mg  upon awakening; does not know why takes Gabapentin; might have had nervous twitch after CVA.    Has purple legs B R>L onset this week.  No pain.  +new onset swelling of ankles; feet usually swell.    Suffers with urinary incontinence.  Leaks every night; wears pads.  Has a pad that sleeps on as well.  Has enuresis since CVA.  Last mammogram in 1997. Colonoscopy at age 74; refuses.  Increase headaches recently.   Immunization History  Administered Date(s) Administered  . Influenza, Seasonal, Injecte, Preservative Fre 04/23/2012  . Influenza,inj,Quad PF,36+ Mos 06/05/2013  . Pneumococcal Conjugate-13 06/22/2014  . Pneumococcal Polysaccharide-23 09/28/2016  . Td 05/24/1995   BP Readings from Last 3 Encounters:  09/28/16 (!) 146/80  01/27/16 122/72  09/11/15 114/80   Wt Readings from Last 3 Encounters:  09/28/16 161 lb 9.6 oz (73.3 kg)  01/27/16 167 lb (75.8 kg)  09/11/15 169 lb (76.7 kg)     Review of Systems  Constitutional: Negative for chills, diaphoresis, fatigue and fever.  Eyes: Negative for visual disturbance.  Respiratory: Negative for cough and shortness of breath.   Cardiovascular: Positive for leg swelling. Negative for chest pain and palpitations.  Gastrointestinal: Negative for abdominal pain, constipation, diarrhea, nausea and vomiting.  Endocrine: Negative for cold intolerance, heat intolerance, polydipsia, polyphagia and polyuria.    Neurological: Positive for headaches. Negative for dizziness, tremors, seizures, syncope, facial asymmetry, speech difficulty, weakness, light-headedness and numbness.    Past Medical History:  Diagnosis Date  . Depression   . Diabetes mellitus without complication ([place])   . Hypertension   . Stroke [facility])    Past Surgical History:  Procedure Laterality Date  . UMBILICAL HERNIA REPAIR     Allergies  Allergen Reactions  . Penicillins Nausea Only    Social History   Social History  . Marital status: Single    Spouse name: N/A  . Number of children: N/A  . Years of education: N/A   Occupational History  . Not on file.   Social History Main Topics  . Smoking status: Never Smoker  . Smokeless tobacco: Never Used  . Alcohol use No  . Drug use: No  . Sexual activity: Not on file   Other Topics Concern  . Not on file   Social History Narrative   Marital status: single; not dating      Children: none      Live: alone      Employment: retired in 1997; Corporate treasurer      Tobacco: never      Alcohol: drinks "a little" 3 bottles of liquor and 3 bottles of wine      Exercise: none in 2018   Family History  Problem Relation Age of Onset  . Parkinson's disease Mother   . Dementia Father   . Cancer Sister 4  Breast cancer       Objective:    BP (!) 146/80 (BP Location: Right Arm, Patient Position: Sitting, Cuff Size: Small)   Pulse 92   Temp 97.8 F (36.6 C) (Oral)   Resp 16   Ht 5' 1.5" (1.562 m)   Wt 161 lb 9.6 oz (73.3 kg)   SpO2 94%   BMI 30.04 kg/m  Physical Exam  Constitutional: She is oriented to person, place, and time. She appears well-developed and well-nourished. No distress.  HENT:  Head: Normocephalic and atraumatic.  Right Ear: External ear normal.  Left Ear: External ear normal.  Nose: Nose normal.  Mouth/Throat: Oropharynx is clear and moist.  Eyes: Conjunctivae and EOM are normal. Pupils are equal, round, and reactive to light.   Neck: Normal range of motion. Neck supple. Carotid bruit is not present. No thyromegaly present.  Cardiovascular: Normal rate, regular rhythm, normal heart sounds and intact distal pulses.  Exam reveals no gallop and no friction rub.   No murmur heard. 1+ pitting edema B lower extremities.  Pulmonary/Chest: Effort normal and breath sounds normal. She has no wheezes. She has no rales.  Abdominal: Soft. Bowel sounds are normal. She exhibits no distension and no mass. There is no tenderness. There is no rebound and no guarding.  Musculoskeletal: She exhibits edema.  Lymphadenopathy:    She has no cervical adenopathy.  Neurological: She is alert and oriented to person, place, and time. No cranial nerve deficit.  Skin: Skin is warm and dry. No rash noted. She is not diaphoretic. No erythema. No pallor.  Psychiatric: She has a normal mood and affect. Her behavior is normal.   Results for orders placed or performed in visit on 09/28/16  CBC with Differential/Platelet  Result Value Ref Range   WBC 6.9 3.4 - 10.8 x10E3/uL   RBC 5.12 3.77 - 5.28 x10E6/uL   Hemoglobin 14.8 11.1 - 15.9 g/dL   Hematocrit 45.3 34.0 - 46.6 %   MCV 89 79 - 97 fL   MCH 28.9 26.6 - 33.0 pg   MCHC 32.7 31.5 - 35.7 g/dL   RDW 13.6 12.3 - 15.4 %   Platelets 313 150 - 379 x10E3/uL   Neutrophils 83 Not Estab. %   Lymphs 9 Not Estab. %   Monocytes 6 Not Estab. %   Eos 2 Not Estab. %   Basos 0 Not Estab. %   Neutrophils Absolute 5.7 1.4 - 7.0 x10E3/uL   Lymphocytes Absolute 0.6 (L) 0.7 - 3.1 x10E3/uL   Monocytes Absolute 0.4 0.1 - 0.9 x10E3/uL   EOS (ABSOLUTE) 0.1 0.0 - 0.4 x10E3/uL   Basophils Absolute 0.0 0.0 - 0.2 x10E3/uL   Immature Granulocytes 0 Not Estab. %   Immature Grans (Abs) 0.0 0.0 - 0.1 x10E3/uL  Comprehensive metabolic panel  Result Value Ref Range   Glucose 136 (H) 65 - 99 mg/dL   BUN 18 8 - 27 mg/dL   Creatinine, Ser 0.88 0.57 - 1.00 mg/dL   GFR calc non Af Amer 66 >59 mL/min/1.73   GFR calc Af  Amer 76 >59 mL/min/1.73   BUN/Creatinine Ratio 20 12 - 28   Sodium 139 134 - 144 mmol/L   Potassium 4.6 3.5 - 5.2 mmol/L   Chloride 99 96 - 106 mmol/L   CO2 23 18 - 29 mmol/L   Calcium 9.5 8.7 - 10.3 mg/dL   Total Protein 7.2 6.0 - 8.5 g/dL   Albumin 4.1 3.5 - 4.8 g/dL   Globulin, Total  3.1 1.5 - 4.5 g/dL   Albumin/Globulin Ratio 1.3 1.2 - 2.2   Bilirubin Total 0.6 0.0 - 1.2 mg/dL   Alkaline Phosphatase 117 39 - 117 IU/L   AST 19 0 - 40 IU/L   ALT 11 0 - 32 IU/L  Lipid panel  Result Value Ref Range   Cholesterol, Total 218 (H) 100 - 199 mg/dL   Triglycerides 217 (H) 0 - 149 mg/dL   HDL 39 (L) >39 mg/dL   VLDL Cholesterol Cal 43 (H) 5 - 40 mg/dL   LDL Calculated 136 (H) 0 - 99 mg/dL   Chol/HDL Ratio 5.6 (H) 0.0 - 4.4 ratio  Hepatitis C antibody  Result Value Ref Range   Hep C Virus Ab <0.1 0.0 - 0.9 s/co ratio  POCT glucose (manual entry)  Result Value Ref Range   POC Glucose 126 (A) 70 - 99 mg/dl  POCT glycosylated hemoglobin (Hb A1C)  Result Value Ref Range   Hemoglobin A1C 5.9        Assessment & Plan:   1. Type 2 diabetes mellitus with complication, without long-term current use of insulin ([place])   2. Hemiparesis affecting right side as late effect of cerebrovascular accident ([place])   3. HYPERCHOLESTEROLEMIA   4. INCONTINENCE, STRESS, FEMALE   5. HYPERTENSION, BENIGN SYSTEMIC   6. Need for hepatitis C screening test   7. Refusal of statin medication by patient   8. Palpitations   9. Need for prophylactic vaccination against Streptococcus pneumoniae (pneumococcus)   10. Encounter for medication refill    -controlled diabetes; obtain labs; continue Metformin. -stop Amlodipine due to lower extremity edema; start Losartan/HCTZ 50-12.5 daily for blood pressure.; obtian labs. -s/p Pneumovax -stop Gabapentin as pt desires to minimize medications.   Orders Placed This Encounter  Procedures  . Pneumococcal polysaccharide vaccine 23-valent greater than or equal to 2yo  subcutaneous/IM  . CBC with Differential/Platelet  . Comprehensive metabolic panel    Order Specific Question:   Has the patient fasted?    Answer:   Yes  . Lipid panel    Order Specific Question:   Has the patient fasted?    Answer:   Yes  . Hepatitis C antibody  . Microalbumin, urine    Standing Status:   Future    Standing Expiration Date:   12/29/2016  . POCT glucose (manual entry)  . POCT glycosylated hemoglobin (Hb A1C)  . POCT urinalysis dipstick    Standing Status:   Future    Standing Expiration Date:   12/29/2016  . EKG 12-Lead   Meds ordered this encounter  Medications  . losartan-hydrochlorothiazide (HYZAAR) 50-12.5 MG tablet    Sig: Take 1 tablet by mouth daily.    Dispense:  90 tablet    Refill:  3  . metFORMIN (GLUCOPHAGE) 500 MG tablet    Sig: Take 1 tablet (500 mg total) by mouth daily with breakfast.    Dispense:  90 tablet    Refill:  1  . metoprolol succinate (TOPROL-XL) 25 MG 24 hr tablet    Sig: Take 1 tablet (25 mg total) by mouth daily.    Dispense:  90 tablet    Refill:  1    Return in about 2 months (around 11/28/2016) for recheck blood pressure.   Kristi Elayne Guerin, M.D. Primary Care at [facility] previously [facility] [address] [place], [place]  [address] [phone] phone [phone] fax

## 2016-09-29 LAB — LIPID PANEL
CHOLESTEROL TOTAL: 218 mg/dL — AB (ref 100–199)
Chol/HDL Ratio: 5.6 ratio — ABNORMAL HIGH (ref 0.0–4.4)
HDL: 39 mg/dL — ABNORMAL LOW (ref >39)
LDL Calculated: 136 mg/dL — ABNORMAL HIGH (ref 0–99)
TRIGLYCERIDES: 217 mg/dL — AB (ref 0–149)
VLDL CHOLESTEROL CAL: 43 mg/dL — AB (ref 5–40)

## 2016-09-29 LAB — COMPREHENSIVE METABOLIC PANEL
A/G RATIO: 1.3 (ref 1.2–2.2)
ALK PHOS: 117 IU/L (ref 39–117)
ALT: 11 IU/L (ref 0–32)
AST: 19 IU/L (ref 0–40)
Albumin: 4.1 g/dL (ref 3.5–4.8)
BUN/Creatinine Ratio: 20 (ref 12–28)
BUN: 18 mg/dL (ref 8–27)
Bilirubin Total: 0.6 mg/dL (ref 0.0–1.2)
CHLORIDE: 99 mmol/L (ref 96–106)
CO2: 23 mmol/L (ref 18–29)
Calcium: 9.5 mg/dL (ref 8.7–10.3)
Creatinine, Ser: 0.88 mg/dL (ref 0.57–1.00)
GFR calc Af Amer: 76 mL/min/{1.73_m2} (ref >59)
GFR calc non Af Amer: 66 mL/min/{1.73_m2} (ref >59)
GLUCOSE: 136 mg/dL — AB (ref 65–99)
Globulin, Total: 3.1 g/dL (ref 1.5–4.5)
POTASSIUM: 4.6 mmol/L (ref 3.5–5.2)
Sodium: 139 mmol/L (ref 134–144)
TOTAL PROTEIN: 7.2 g/dL (ref 6.0–8.5)

## 2016-09-29 LAB — CBC WITH DIFFERENTIAL/PLATELET
BASOS ABS: 0 10*3/uL (ref 0.0–0.2)
Basos: 0 %
EOS (ABSOLUTE): 0.1 10*3/uL (ref 0.0–0.4)
EOS: 2 %
Hematocrit: 45.3 % (ref 34.0–46.6)
Hemoglobin: 14.8 g/dL (ref 11.1–15.9)
IMMATURE GRANS (ABS): 0 10*3/uL (ref 0.0–0.1)
Immature Granulocytes: 0 %
LYMPHS: 9 %
Lymphocytes Absolute: 0.6 10*3/uL — ABNORMAL LOW (ref 0.7–3.1)
MCH: 28.9 pg (ref 26.6–33.0)
MCHC: 32.7 g/dL (ref 31.5–35.7)
MCV: 89 fL (ref 79–97)
MONOCYTES: 6 %
Monocytes Absolute: 0.4 10*3/uL (ref 0.1–0.9)
Neutrophils Absolute: 5.7 10*3/uL (ref 1.4–7.0)
Neutrophils: 83 %
PLATELETS: 313 10*3/uL (ref 150–379)
RBC: 5.12 x10E6/uL (ref 3.77–5.28)
RDW: 13.6 % (ref 12.3–15.4)
WBC: 6.9 10*3/uL (ref 3.4–10.8)

## 2016-09-29 LAB — HEPATITIS C ANTIBODY

## 2016-10-01 ENCOUNTER — Telehealth: Payer: Self-pay | Admitting: Family Medicine

## 2016-10-01 DIAGNOSIS — Z760 Encounter for issue of repeat prescription: Secondary | ICD-10-CM

## 2016-10-01 NOTE — Telephone Encounter (Signed)
Patient would like to talk to Dr Tamala Julian about her Pneumonia shot that she got, she states she has a huge lump and it is very painful, she also wants to know if she needs to start taking her Gabapentin again.  Her call back number is [phone]

## 2016-10-03 NOTE — Telephone Encounter (Signed)
Arm lump as gone now but is still very sore.  Please address gabapentin restart  , right leg and arm numbness returned since stopping.

## 2016-10-03 NOTE — Telephone Encounter (Signed)
lmtcb

## 2016-10-04 ENCOUNTER — Telehealth: Payer: Self-pay | Admitting: Family Medicine

## 2016-10-04 MED ORDER — GABAPENTIN 300 MG PO CAPS: 300.0000 mg | ORAL_CAPSULE | Freq: Every day | ORAL | 1 refills | Status: AC

## 2016-10-04 NOTE — Telephone Encounter (Signed)
Regarding pneumonia vaccine --- it is very common to develop a local reaction to any vaccine administered.  The swelling and pain usually last for 3-5 days after the injection. I am happy to hear that her arm is feeling better. Regarding the Gabapentin---- I would recommend restarting Gabapentin and I have sent in a refill.

## 2016-10-04 NOTE — Telephone Encounter (Signed)
Pt came in to drop off urine sample. Pt wanted to ask Dr. Tamala Julian if she could start taking gabapentin again. Pt said Dr. Tamala Julian told her to stop taking it but she is feeling bad and is wondering if she could start it again. Pt also said she still has not received her blood pressure medicine and wanted to know if we needed to call [facility] concerning that. Please advise.

## 2016-10-05 NOTE — Telephone Encounter (Signed)
Pt arm is feeling a lot better States, she has plenty of Gabapentin, she needs Losartan  Informed medication was sent to [facility] mail delivery 09/28/16

## 2016-10-06 NOTE — Telephone Encounter (Signed)
Pt advised both refilled Wants gabepentin cx from mail order, done

## 2016-10-17 MED ORDER — METOPROLOL SUCCINATE ER 25 MG PO TB24: 25.0000 mg | ORAL_TABLET | Freq: Every day | ORAL | 1 refills | Status: AC

## 2016-10-17 MED ORDER — METFORMIN HCL 500 MG PO TABS: 500.0000 mg | ORAL_TABLET | Freq: Every day | ORAL | 1 refills | Status: DC

## 2016-11-03 ENCOUNTER — Ambulatory Visit (INDEPENDENT_AMBULATORY_CARE_PROVIDER_SITE_OTHER): Payer: Medicare PPO | Admitting: Family Medicine

## 2016-11-03 ENCOUNTER — Encounter: Payer: Self-pay | Admitting: Family Medicine

## 2016-11-03 ENCOUNTER — Ambulatory Visit (INDEPENDENT_AMBULATORY_CARE_PROVIDER_SITE_OTHER): Payer: Medicare PPO

## 2016-11-03 VITALS — BP 107/74 | HR 100 | Temp 97.5°F | Resp 18

## 2016-11-03 DIAGNOSIS — R531 Weakness: Secondary | ICD-10-CM | POA: Diagnosis not present

## 2016-11-03 DIAGNOSIS — R600 Localized edema: Secondary | ICD-10-CM

## 2016-11-03 DIAGNOSIS — M25551 Pain in right hip: Secondary | ICD-10-CM

## 2016-11-03 DIAGNOSIS — M7989 Other specified soft tissue disorders: Secondary | ICD-10-CM | POA: Diagnosis not present

## 2016-11-03 LAB — POCT CBC
GRANULOCYTE PERCENT: 83.7 % — AB (ref 37–80)
HEMATOCRIT: 40.1 % (ref 37.7–47.9)
HEMOGLOBIN: 13.9 g/dL (ref 12.2–16.2)
LYMPH, POC: 0.8 (ref 0.6–3.4)
MCH, POC: 30.3 pg (ref 27–31.2)
MCHC: 34.6 g/dL (ref 31.8–35.4)
MCV: 87.7 fL (ref 80–97)
MID (cbc): 0.3 (ref 0–0.9)
MPV: 7.7 fL (ref 0–99.8)
POC GRANULOCYTE: 5.8 (ref 2–6.9)
POC LYMPH PERCENT: 11.4 %L (ref 10–50)
POC MID %: 4.9 %M (ref 0–12)
Platelet Count, POC: 251 10*3/uL (ref 142–424)
RBC: 4.58 M/uL (ref 4.04–5.48)
RDW, POC: 13.6 %
WBC: 6.9 10*3/uL (ref 4.6–10.2)

## 2016-11-03 LAB — GLUCOSE, POCT (MANUAL RESULT ENTRY): POC GLUCOSE: 204 mg/dL — AB (ref 70–99)

## 2016-11-03 MED ORDER — IBUPROFEN 800 MG PO TABS: 800.0000 mg | ORAL_TABLET | Freq: Three times a day (TID) | ORAL | 0 refills | Status: DC | PRN

## 2016-11-03 NOTE — Patient Instructions (Signed)
We recommend that you schedule a mammogram for breast cancer screening. Typically, you do not need a referral to do this. Please contact a local imaging center to schedule your mammogram.  [facility] - [phone]  *ask for the Radiology Department [facility] - [phone] or [phone]  [facility] - [phone] [facility] - [phone] [facility] - [phone]  *ask for the Radiology Department [facility] - [phone]  *ask for the Radiology Department [facility] - [phone]  *ask for the Mammography Department [facility] - [phone]     IF you received an x-ray today, you will receive an invoice from [facility]. Please contact [facility] at [phone] with questions or concerns regarding your invoice.   IF you received labwork today, you will receive an invoice from [facility]. Please contact [facility] at [phone] with questions or concerns regarding your invoice.   Our billing staff will not be able to assist you with questions regarding bills from these companies.  You will be contacted with the lab results as soon as they are available. The fastest way to get your results is to activate your My Chart account. Instructions are located on the last page of this paperwork. If you have not heard from us regarding the results in 2 weeks, please contact this office.      

## 2016-11-03 NOTE — Progress Notes (Signed)
Chief Complaint  Patient presents with  . Fall    per PT fell on Saturday, swollen right leg    HPI  Fall, hip pain, leg swelling This is a 22y F who presents with right lower extremity edema. She states that she was trying on her friend's back brace and fell backwards and after that her right hip started hurting.  She did not hit her head or have loss of consciousness. States that she has been having increasing right hip and side pain.  The incident was on Saturday which was 5 days prior.  She states that she only started having swelling in her legs since Monday so she started sitting with her legs stretched out in a chair at home but that ha snot helped.  Her right leg is the one that is primarily swollen.  She came in today because she was sitting on the toilet seat when she noticed that the swelling was also in her knee.  Her right knee was almost twice the size of the left.  She drove herself here to the office visit.  She lives alone. Her neighbors help her out prn.  She reports a previous right hip injury related to horseback riding  She states that her hip hurts intermittently  Weakness- she said since the fall she felt like her muscles are just sluggish and she has been resting more.  She reports that she usually visits her neighbors.  She is not dizzy.  She just feels like her sleep is not as deep because she has to keep shifting to find a comfortable spot.  She sleeps in the morning until about 2-3pm in the afternoon.   Her PMH is significant for diabetes and hypertension She reports that she took all her BP medications before coming in today. She states that she does not feel dizzy but feels tired. She was resting at home most of the day.  BP Readings from Last 3 Encounters:  11/03/16 107/74  09/28/16 (!) 146/80  01/27/16 122/72   She states that she just ate to help with her sense of weakness Hs reports that her diabetes is well controlled on metformin Component                    Latest Ref Rng & Units 11/03/2016  POC Glucose                       70 - 99 mg/dl 204 (A)   Lab Results  Component Value Date   HGBA1C 5.9 09/28/2016    Lab Results  Component Value Date   CREATININE 0.88 09/28/2016    Past Medical History:  Diagnosis Date  . Depression   . Diabetes mellitus without complication ([place])   . Hypertension   . Stroke [facility]     Current Outpatient Prescriptions  Medication Sig Dispense Refill  . aspirin 325 MG tablet Take 325 mg by mouth daily.    Marland Kitchen gabapentin (NEURONTIN) 300 MG capsule Take 1 capsule (300 mg total) by mouth at bedtime. 90 capsule 1  . Glucose Blood (BLOOD GLUCOSE TEST STRIPS) STRP Apply 1 application topically every morning. 100 each 2  . losartan-hydrochlorothiazide (HYZAAR) 50-12.5 MG tablet Take 1 tablet by mouth daily. 90 tablet 3  . metFORMIN (GLUCOPHAGE) 500 MG tablet Take 1 tablet (500 mg total) by mouth daily with breakfast. 90 tablet 1  . metoprolol succinate (TOPROL-XL) 25 MG 24 hr tablet Take 1 tablet (25  mg total) by mouth daily. 90 tablet 1  . amLODipine (NORVASC) 5 MG tablet Take 1 tablet (5 mg total) by mouth daily. (Patient not taking: Reported on 11/03/2016) 90 tablet 0  . Biotin w/ Vitamins C & E (HAIR SKIN & NAILS GUMMIES PO) Take by mouth.    Marland Kitchen CALCIUM-MAG-VIT C-VIT D PO Take by mouth.     No current facility-administered medications for this visit.     Allergies:  Allergies  Allergen Reactions  . Penicillins Nausea Only    Past Surgical History:  Procedure Laterality Date  . UMBILICAL HERNIA REPAIR      Social History   Social History  . Marital status: Single    Spouse name: N/A  . Number of children: N/A  . Years of education: N/A   Social History Main Topics  . Smoking status: Never Smoker  . Smokeless tobacco: Never Used  . Alcohol use No  . Drug use: No  . Sexual activity: Not Asked   Other Topics Concern  . None   Social History Narrative   Marital status: single; not dating       Children: none      Live: alone      Employment: retired in 1997; Corporate treasurer      Tobacco: never      Alcohol: drinks "a little" 3 bottles of liquor and 3 bottles of wine      Exercise: none in 2018    Review of Systems  Constitutional: Negative for chills and fever.  Respiratory: Negative for cough, shortness of breath and wheezing.   Cardiovascular: Negative for chest pain and palpitations.  Gastrointestinal: Negative for abdominal pain, nausea and vomiting.  Musculoskeletal: Positive for back pain, falls and joint pain.    Objective: Vitals:   11/03/16 1703  BP: 107/74  Pulse: 100  Resp: 18  Temp: 97.5 F (36.4 C)  TempSrc: Oral  SpO2: 94%    Physical Exam  Constitutional: She is oriented to person, place, and time. She appears well-developed and well-nourished.  HENT:  Head: Normocephalic and atraumatic.  Eyes: Conjunctivae and EOM are normal.  Cardiovascular: Normal rate, regular rhythm and normal heart sounds.   Pulmonary/Chest: Effort normal and breath sounds normal. No respiratory distress. She has no wheezes.  Musculoskeletal:       Right hip: She exhibits tenderness. She exhibits normal range of motion, normal strength, no bony tenderness and no swelling.       Right knee: She exhibits swelling. She exhibits normal range of motion. No tenderness found.       Legs: Neurological: She is alert and oriented to person, place, and time.  Skin: Skin is warm. Capillary refill takes less than 2 seconds.  Psychiatric: She has a normal mood and affect. Her behavior is normal. Judgment and thought content normal.   Component                   Latest Ref Rng & Units 11/03/2016  POC Glucose                    70 - 99 mg/dl 204 (A)   Component     Latest Ref Rng & Units 11/03/2016  WBC     4.6 - 10.2 K/uL 6.9  Lymph, poc     0.6 - 3.4 0.8  POC LYMPH PERCENT     10 - 50 %L 11.4  MID (cbc)     0 - 0.9 0.3  POC MID %     0 - 12 %M 4.9  POC Granulocyte     2 -  6.9 5.8  Granulocyte percent     37 - 80 %G 83.7 (A)  RBC     4.04 - 5.48 M/uL 4.58  Hemoglobin     12.2 - 16.2 g/dL 13.9  HCT     37.7 - 47.9 % 40.1  MCV     80 - 97 fL 87.7  MCH     27 - 31.2 pg 30.3  MCHC     31.8 - 35.4 g/dL 34.6  RDW, POC     % 13.6  Platelet Count, POC     142 - 424 K/uL 251  MPV     0 - 99.8 fL 7.7   DG HIP (WITH OR WITHOUT PELVIS) 2-3V RIGHT  COMPARISON:  Abdomen films from 12/24/2003.  FINDINGS: There is no evidence of hip fracture or dislocation. There is no evidence of arthropathy or other focal bone abnormality.  IMPRESSION: Negative.   Electronically Signed   By: Misty Stanley M.D.   On: 11/03/2016 18:00   Assessment and Plan Annette Farmer was seen today for fall.  Diagnoses and all orders for this visit:  Lower extremity edema -     POCT CBC -     POCT glucose (manual entry) -     DG HIP UNILAT W OR W/O PELVIS 2-3 VIEWS RIGHT  Weakness -     POCT CBC -     POCT glucose (manual entry)  Right hip pain -     DG HIP UNILAT W OR W/O PELVIS 2-3 VIEWS RIGHT  Swollen leg -     DG HIP UNILAT W OR W/O PELVIS 2-3 VIEWS RIGHT  Medical Decision Making. Right hip pain Concerning that with her history of right hips pain that there would be arthritis.  No arthritis seen on xray Her pain is not being controlled with Tylenol  Advised ibuprofen 800mg  tid prn.  Review of renal function showed normal Cr. Pt has no CAD.   LE edema and swollen leg Discussed that since her bp is low normal she will not be given lasix as she would be high risk for HYPOtension and lives alone.  She should wear compression stockings and follow up in 2 days for reevaluation Her CBC today was normal and her glucose was slightly elevated because of recently eating.   Weakness- normal glucose today, pt alert and oriented.  Advised her to get her neighbors to help her with meal prep and to stay hydrated.   A total of 40 minutes were spent face-to-face with the  patient during this encounter and over half of that time was spent on counseling and coordination of care.  [place]

## 2016-11-05 ENCOUNTER — Encounter: Payer: Self-pay | Admitting: Family Medicine

## 2016-11-05 ENCOUNTER — Ambulatory Visit (INDEPENDENT_AMBULATORY_CARE_PROVIDER_SITE_OTHER): Payer: Medicare PPO | Admitting: Family Medicine

## 2016-11-05 ENCOUNTER — Ambulatory Visit (INDEPENDENT_AMBULATORY_CARE_PROVIDER_SITE_OTHER): Payer: Medicare PPO

## 2016-11-05 VITALS — BP 116/78 | HR 96 | Temp 97.4°F | Resp 16 | Ht 61.0 in | Wt 161.0 lb

## 2016-11-05 DIAGNOSIS — B372 Candidiasis of skin and nail: Secondary | ICD-10-CM | POA: Diagnosis not present

## 2016-11-05 DIAGNOSIS — M25551 Pain in right hip: Secondary | ICD-10-CM

## 2016-11-05 DIAGNOSIS — R0789 Other chest pain: Secondary | ICD-10-CM

## 2016-11-05 DIAGNOSIS — M79661 Pain in right lower leg: Secondary | ICD-10-CM

## 2016-11-05 DIAGNOSIS — M7989 Other specified soft tissue disorders: Secondary | ICD-10-CM

## 2016-11-05 MED ORDER — NYSTATIN 100000 UNIT/GM EX POWD: Freq: Three times a day (TID) | CUTANEOUS | 0 refills | Status: DC

## 2016-11-05 NOTE — Progress Notes (Addendum)
By signing my name below, I, Mesha Guinyard, attest that this documentation has been prepared under the direction and in the presence of Merri Ray, MD.  Electronically Signed: Verlee Monte, Medical Scribe. 11/05/16. 4:39 PM.  Subjective:    Patient ID: Annette Farmer, female    DOB: 08-Jul-1945, 71 y.o.   MRN: [id number]  HPI Chief Complaint  Patient presents with  . Leg Swelling    right leg, pain 2 days ago     HPI Comments: Annette Farmer is a 71 y.o. female who presents to Primary Care at [facility] for right leg follow-up. She was last seen June 14th with Dr. Nolon Rod. She was having right lower extremity edema, and right hip pain after a fall 5 days prior. Noticed swelling 3 days prior to that visit, primarily right leg swelling and right knee swelling. She had asymmetric swelling from her right knee to her right foot without pitting edema, she had a nl CBC, right hip x-ray was negative. She had a BMP with nl creatinine, she was started on ibuprofen 800 mg TID, and recommended compressions stockings.  Right hip pain started 2-3 days ago, and she initally stated she had worsening right leg swelling onset 2 days ago. She later on clarifies her leg swelling was worse when she saw Dr. Nolon Rod, and it's not as tight as it was with "less give". Pt is still having right hip pain, and her right knee pain has improved. Hip pain doesn't worsen with weight bearing, but it does ache at times. Pt took acetaminophen before coming in for some relief of her sxs, and she's been wearing a compression stocking for relief of swelling. Denies SOB.  Later in visit, stated that she has had right hip pain on and off for some time, but current pain feels a little bit worse. Denies history of DVT/PE.   Chest Pain: Onset a week ago after the fall. Reports it's the muscle that hurts. Chest pain worsens after pushing herself up with her cane.  Patient Active Problem List   Diagnosis Date Noted  . Refusal  of statin medication by patient 09/28/2016  . Hemiparesis affecting right side as late effect of cerebrovascular accident ([place]) 04/23/2012  . Type 2 diabetes mellitus ([place]) 07/20/2006  . HYPERCHOLESTEROLEMIA 07/20/2006  . OBESITY, NOS 07/20/2006  . HYPERTENSION, BENIGN SYSTEMIC 07/20/2006  . HEMORRHOIDS, NOS 07/20/2006  . CONSTIPATION 07/20/2006  . INCONTINENCE, STRESS, FEMALE 07/20/2006  . MENOPAUSAL SYNDROME 07/20/2006   Past Medical History:  Diagnosis Date  . Depression   . Diabetes mellitus without complication ([place])   . Hypertension   . Stroke [facility])    Past Surgical History:  Procedure Laterality Date  . UMBILICAL HERNIA REPAIR     Allergies  Allergen Reactions  . Penicillins Nausea Only   Prior to Admission medications   Medication Sig Start Date End Date Taking? Authorizing Provider  aspirin 325 MG tablet Take 325 mg by mouth daily.   Yes [provider]  gabapentin (NEURONTIN) 300 MG capsule Take 1 capsule (300 mg total) by mouth at bedtime. 10/04/16  Yes Wardell Honour, MD  Glucose Blood (BLOOD GLUCOSE TEST STRIPS) STRP Apply 1 application topically every morning. 06/05/13  Yes Robyn Haber, MD  ibuprofen (ADVIL,MOTRIN) 800 MG tablet Take 1 tablet (800 mg total) by mouth every 8 (eight) hours as needed. 11/03/16  Yes Stallings, Zoe A, MD  losartan-hydrochlorothiazide (HYZAAR) 50-12.5 MG tablet Take 1 tablet by mouth daily. 09/28/16  Yes Tamala Julian,  Renette Butters, MD  metFORMIN (GLUCOPHAGE) 500 MG tablet Take 1 tablet (500 mg total) by mouth daily with breakfast. 10/17/16  Yes Wardell Honour, MD  metoprolol succinate (TOPROL-XL) 25 MG 24 hr tablet Take 1 tablet (25 mg total) by mouth daily. 10/17/16  Yes Wardell Honour, MD   Social History   Social History  . Marital status: Single    Spouse name: N/A  . Number of children: N/A  . Years of education: N/A   Occupational History  . Not on file.   Social History Main Topics  . Smoking status: Never Smoker  .  Smokeless tobacco: Never Used  . Alcohol use No  . Drug use: No  . Sexual activity: Not on file   Other Topics Concern  . Not on file   Social History Narrative   Marital status: single; not dating      Children: none      Live: alone      Employment: retired in 1997; Corporate treasurer      Tobacco: never      Alcohol: drinks "a little" 3 bottles of liquor and 3 bottles of wine      Exercise: none in 2018   Review of Systems  Respiratory: Negative for shortness of breath.   Cardiovascular: Positive for chest pain and leg swelling.  Musculoskeletal: Positive for arthralgias, joint swelling and myalgias.   Objective:  Physical Exam  Constitutional: She appears well-developed and well-nourished. No distress.  HENT:  Head: Normocephalic and atraumatic.  Eyes: Conjunctivae are normal.  Neck: Neck supple.  Cardiovascular: Normal rate, regular rhythm and normal heart sounds.  Exam reveals no gallop and no friction rub.   No murmur heard. Pulmonary/Chest: Effort normal and breath sounds normal. No respiratory distress. She has no wheezes. She has no rales.  No external border pain Complains of pain in the left pectoralis  Musculoskeletal:  Soft tissue swelling that extends to her right thigh Diffuse swelling along the right leg Diffuse soft tissue swelling with some dependant ecchymosis on right lateral ankle, ankle non tender Right knee no focal bony tenderness Tender along proximal anterior tibia Distal tibia non tender Hip ROM non tender Complains of pain within the hip joint but no with internal external rotation of the hip   Neurological: She is alert.  Skin: Skin is warm and dry.  Slight hyperpigmentation of inguinal fold of right upper thigh but no skin break down, no erythema Or skin blistering Slight amount of dry skin in inguinal fold  Psychiatric: She has a normal mood and affect. Her behavior is normal.  Nursing note and vitals reviewed.   Vitals:   11/05/16  1554  BP: 116/78  Pulse: 96  Resp: 16  Temp: 97.4 F (36.3 C)  TempSrc: Oral  SpO2: 95%  Weight: 161 lb (73 kg)  Height: 5\' 1"  (1.549 m)   Body mass index is 30.42 kg/m. Assessment & Plan:    Annette Farmer is a 71 y.o. female Pain in right lower leg - Plan: DG Tibia/Fibula Right, Ambulatory referral to Orthopedic Surgery Right leg swelling - Plan: Ambulatory referral to Orthopedic Surgery, VAS [place] LOWER EXTREMITY VENOUS (DVT) Right hip pain  - Based on history provided as above, sounds that she has had intermittent right hip pain for some time, but worse after recent fall. Initial x-ray without signs of fracture. No pain with internal or external rotation when seated, but still complains of aching throughout the day since injury.  Does appear to have significant swelling of her right leg versus her left. Recommended MRI versus orthopedic eval urgently to check into next step on evaluation of her hip pain and leg swelling. Additionally recommended ultrasound to rule out DVT. Based on her schedule, she did not want to have these referrals or tests performed until Tuesday afternoon. Orders placed, but  ER/RTC precautions were given if worsening sooner.   -Lower leg x-ray without fracture.  Candidal intertrigo - Plan: nystatin (MYCOSTATIN/NYSTOP) powder  - nystatin powder Rx.   Chest wall pain  - Improving. Offered x-ray, but declined. Handout given and RTC precautions if worsening or not continuing to improve.  Does report long-standing fatigue. See last visit. Vital signs are reassuring. Follow-up with primary care provider, RTC precautions if acute worsening.  Meds ordered this encounter  Medications  . nystatin (MYCOSTATIN/NYSTOP) powder    Sig: Apply topically 3 (three) times daily. TO RASH OF R GROIN    Dispense:  15 g    Refill:  0   Patient Instructions   As we discussed I'm concerned about your persistent hip pain and swelling in the leg.  Although there was no hip  fracture seen on initial x-ray, I would recommend meeting with orthopaedist as you may need an MRI. I  recommend you avoid weight on that leg if any pain with weightbearing. Additionally I would like to order an ultrasound to make sure there is no problem with the blood vessels on that leg, and will order that for Tuesday if that is the soonest you can have it.  If you have any worsening pain or swelling, I would recommend evaluation through the emergency room sooner.   I prescribed antifungal powder for the rash on your right groin.  See information below on chest wall pain.  If the area is not continuing to improve, or if you change your mind on x-ray of that area, please return for recheck.  If you are having any worsening symptoms including any worsening fatigue, worsening pain, chest pain, or shortness of breath, follow-up in the emergency room as we discussed.    Hip Pain The hip is the joint between the upper legs and the lower pelvis. The bones, cartilage, tendons, and muscles of your hip joint support your body and allow you to move around. Hip pain can range from a minor ache to severe pain in one or both of your hips. The pain may be felt on the inside of the hip joint near the groin, or the outside near the buttocks and upper thigh. You may also have swelling or stiffness. Follow these instructions at home: Managing pain, stiffness, and swelling  If directed, apply ice to the injured area. ? Put ice in a plastic bag. ? Place a towel between your skin and the bag. ? Leave the ice on for 20 minutes, 2-3 times a day  Sleep with a pillow between your legs on your most comfortable side.  Avoid any activities that cause pain. General instructions  Take over-the-counter and prescription medicines only as told by your health care provider.  Do any exercises as told by your health care provider.  Record the following: ? How often you have hip pain. ? The location of your  pain. ? What the pain feels like. ? What makes the pain worse.  Keep all follow-up visits as told by your health care provider. This is important. Contact a health care provider if:  You cannot put weight on  your leg.  Your pain or swelling continues or gets worse after one week.  It gets harder to walk.  You have a fever. Get help right away if:  You fall.  You have a sudden increase in pain and swelling in your hip.  Your hip is red or swollen or very tender to touch. Summary  Hip pain can range from a minor ache to severe pain in one or both of your hips.  The pain may be felt on the inside of the hip joint near the groin, or the outside near the buttocks and upper thigh.  Avoid any activities that cause pain.  Record how often you have hip pain, the location of the pain, what makes it worse and what it feels like. This information is not intended to replace advice given to you by your health care provider. Make sure you discuss any questions you have with your health care provider. Document Released: 10/27/2009 Document Revised: 04/11/2016 Document Reviewed: 04/11/2016 Elsevier Interactive Patient Education  2017 [employer]   Chest Wall Pain Chest wall pain is pain in or around the bones and muscles of your chest. Sometimes, an injury causes this pain. Sometimes, the cause may not be known. This pain may take several weeks or longer to get better. Follow these instructions at home: Pay attention to any changes in your symptoms. Take these actions to help with your pain:  Rest as told by your health care provider.  Avoid activities that cause pain. These include any activities that use your chest muscles or your abdominal and side muscles to lift heavy items.  If directed, apply ice to the painful area: ? Put ice in a plastic bag. ? Place a towel between your skin and the bag. ? Leave the ice on for 20 minutes, 2-3 times per day.  Take over-the-counter and  prescription medicines only as told by your health care provider.  Do not use tobacco products, including cigarettes, chewing tobacco, and e-cigarettes. If you need help quitting, ask your health care provider.  Keep all follow-up visits as told by your health care provider. This is important.  Contact a health care provider if:  You have a fever.  Your chest pain becomes worse.  You have new symptoms. Get help right away if:  You have nausea or vomiting.  You feel sweaty or light-headed.  You have a cough with phlegm (sputum) or you cough up blood.  You develop shortness of breath. This information is not intended to replace advice given to you by your health care provider. Make sure you discuss any questions you have with your health care provider. Document Released: 05/09/2005 Document Revised: 09/17/2015 Document Reviewed: 08/04/2014 Elsevier Interactive Patient Education  2017 [employer].  IF you received an x-ray today, you will receive an invoice from [facility]. Please contact [facility] at [phone] with questions or concerns regarding your invoice.   IF you received labwork today, you will receive an invoice from [place]. Please contact [facility] at [phone] with questions or concerns regarding your invoice.   Our billing staff will not be able to assist you with questions regarding bills from these companies.  You will be contacted with the lab results as soon as they are available. The fastest way to get your results is to activate your My Chart account. Instructions are located on the last page of this paperwork. If you have not heard from [place] regarding the results in 2 weeks, please contact this  office.       I personally performed the services described in this documentation, which was scribed in my presence. The recorded information has been reviewed and considered for accuracy and completeness, addended by me as needed, and agree  with information above.  Signed,   Merri Ray, MD [facility] [place].  11/05/16 5:54 PM

## 2016-11-05 NOTE — Patient Instructions (Addendum)
As we discussed I'm concerned about your persistent hip pain and swelling in the leg.  Although there was no hip fracture seen on initial x-ray, I would recommend meeting with orthopaedist as you may need an MRI. I  recommend you avoid weight on that leg if any pain with weightbearing. Additionally I would like to order an ultrasound to make sure there is no problem with the blood vessels on that leg, and will order that for Tuesday if that is the soonest you can have it.  If you have any worsening pain or swelling, I would recommend evaluation through the emergency room sooner.   I prescribed antifungal powder for the rash on your right groin.  See information below on chest wall pain.  If the area is not continuing to improve, or if you change your mind on x-ray of that area, please return for recheck.  If you are having any worsening symptoms including any worsening fatigue, worsening pain, chest pain, or shortness of breath, follow-up in the emergency room as we discussed.    Hip Pain The hip is the joint between the upper legs and the lower pelvis. The bones, cartilage, tendons, and muscles of your hip joint support your body and allow you to move around. Hip pain can range from a minor ache to severe pain in one or both of your hips. The pain may be felt on the inside of the hip joint near the groin, or the outside near the buttocks and upper thigh. You may also have swelling or stiffness. Follow these instructions at home: Managing pain, stiffness, and swelling  If directed, apply ice to the injured area. ? Put ice in a plastic bag. ? Place a towel between your skin and the bag. ? Leave the ice on for 20 minutes, 2-3 times a day  Sleep with a pillow between your legs on your most comfortable side.  Avoid any activities that cause pain. General instructions  Take over-the-counter and prescription medicines only as told by your health care provider.  Do any exercises as told by your  health care provider.  Record the following: ? How often you have hip pain. ? The location of your pain. ? What the pain feels like. ? What makes the pain worse.  Keep all follow-up visits as told by your health care provider. This is important. Contact a health care provider if:  You cannot put weight on your leg.  Your pain or swelling continues or gets worse after one week.  It gets harder to walk.  You have a fever. Get help right away if:  You fall.  You have a sudden increase in pain and swelling in your hip.  Your hip is red or swollen or very tender to touch. Summary  Hip pain can range from a minor ache to severe pain in one or both of your hips.  The pain may be felt on the inside of the hip joint near the groin, or the outside near the buttocks and upper thigh.  Avoid any activities that cause pain.  Record how often you have hip pain, the location of the pain, what makes it worse and what it feels like. This information is not intended to replace advice given to you by your health care provider. Make sure you discuss any questions you have with your health care provider. Document Released: 10/27/2009 Document Revised: 04/11/2016 Document Reviewed: 04/11/2016 Elsevier Interactive Patient Education  2017 [place]  Pain Chest wall pain is pain in or around the bones and muscles of your chest. Sometimes, an injury causes this pain. Sometimes, the cause may not be known. This pain may take several weeks or longer to get better. Follow these instructions at home: Pay attention to any changes in your symptoms. Take these actions to help with your pain:  Rest as told by your health care provider.  Avoid activities that cause pain. These include any activities that use your chest muscles or your abdominal and side muscles to lift heavy items.  If directed, apply ice to the painful area: ? Put ice in a plastic bag. ? Place a towel between your  skin and the bag. ? Leave the ice on for 20 minutes, 2-3 times per day.  Take over-the-counter and prescription medicines only as told by your health care provider.  Do not use tobacco products, including cigarettes, chewing tobacco, and e-cigarettes. If you need help quitting, ask your health care provider.  Keep all follow-up visits as told by your health care provider. This is important.  Contact a health care provider if:  You have a fever.  Your chest pain becomes worse.  You have new symptoms. Get help right away if:  You have nausea or vomiting.  You feel sweaty or light-headed.  You have a cough with phlegm (sputum) or you cough up blood.  You develop shortness of breath. This information is not intended to replace advice given to you by your health care provider. Make sure you discuss any questions you have with your health care provider. Document Released: 05/09/2005 Document Revised: 09/17/2015 Document Reviewed: 08/04/2014 Elsevier Interactive Patient Education  2017 [employer].  IF you received an x-ray today, you will receive an invoice from [facility]. Please contact [facility] at [phone] with questions or concerns regarding your invoice.   IF you received labwork today, you will receive an invoice from [place]. Please contact [facility] at [phone] with questions or concerns regarding your invoice.   Our billing staff will not be able to assist you with questions regarding bills from these companies.  You will be contacted with the lab results as soon as they are available. The fastest way to get your results is to activate your My Chart account. Instructions are located on the last page of this paperwork. If you have not heard from [place] regarding the results in 2 weeks, please contact this office.

## 2016-11-10 ENCOUNTER — Other Ambulatory Visit (HOSPITAL_COMMUNITY): Payer: Self-pay | Admitting: Orthopedic Surgery

## 2016-11-10 ENCOUNTER — Encounter (HOSPITAL_COMMUNITY): Payer: Self-pay

## 2016-11-10 ENCOUNTER — Inpatient Hospital Stay (HOSPITAL_COMMUNITY)
Admission: EM | Admit: 2016-11-10 | Discharge: 2016-11-29 | DRG: 754 | Disposition: A | Discharge status: Skilled Nursing Facility | Payer: Medicare PPO | Attending: Internal Medicine | Admitting: Internal Medicine

## 2016-11-10 ENCOUNTER — Other Ambulatory Visit: Payer: Self-pay

## 2016-11-10 ENCOUNTER — Emergency Department (HOSPITAL_COMMUNITY): Payer: Medicare PPO

## 2016-11-10 ENCOUNTER — Ambulatory Visit (HOSPITAL_COMMUNITY)
Admission: RE | Admit: 2016-11-10 | Discharge: 2016-11-10 | Disposition: A | Discharge status: Home / Self Care | Payer: Medicare PPO | Source: Ambulatory Visit | Attending: Cardiology | Admitting: Cardiology

## 2016-11-10 DIAGNOSIS — I69351 Hemiplegia and hemiparesis following cerebral infarction affecting right dominant side: Secondary | ICD-10-CM | POA: Diagnosis not present

## 2016-11-10 DIAGNOSIS — W19XXXA Unspecified fall, initial encounter: Secondary | ICD-10-CM | POA: Diagnosis present

## 2016-11-10 DIAGNOSIS — N179 Acute kidney failure, unspecified: Secondary | ICD-10-CM | POA: Diagnosis present

## 2016-11-10 DIAGNOSIS — I10 Essential (primary) hypertension: Secondary | ICD-10-CM | POA: Diagnosis not present

## 2016-11-10 DIAGNOSIS — I82431 Acute embolism and thrombosis of right popliteal vein: Secondary | ICD-10-CM | POA: Insufficient documentation

## 2016-11-10 DIAGNOSIS — N390 Urinary tract infection, site not specified: Secondary | ICD-10-CM | POA: Diagnosis not present

## 2016-11-10 DIAGNOSIS — E871 Hypo-osmolality and hyponatremia: Secondary | ICD-10-CM | POA: Diagnosis not present

## 2016-11-10 DIAGNOSIS — M7989 Other specified soft tissue disorders: Secondary | ICD-10-CM

## 2016-11-10 DIAGNOSIS — Z515 Encounter for palliative care: Secondary | ICD-10-CM | POA: Diagnosis not present

## 2016-11-10 DIAGNOSIS — C801 Malignant (primary) neoplasm, unspecified: Secondary | ICD-10-CM

## 2016-11-10 DIAGNOSIS — I82811 Embolism and thrombosis of superficial veins of right lower extremities: Secondary | ICD-10-CM | POA: Diagnosis not present

## 2016-11-10 DIAGNOSIS — D701 Agranulocytosis secondary to cancer chemotherapy: Secondary | ICD-10-CM

## 2016-11-10 DIAGNOSIS — R310 Gross hematuria: Secondary | ICD-10-CM | POA: Diagnosis not present

## 2016-11-10 DIAGNOSIS — I82491 Acute embolism and thrombosis of other specified deep vein of right lower extremity: Secondary | ICD-10-CM | POA: Diagnosis not present

## 2016-11-10 DIAGNOSIS — R188 Other ascites: Secondary | ICD-10-CM | POA: Diagnosis present

## 2016-11-10 DIAGNOSIS — I82411 Acute embolism and thrombosis of right femoral vein: Secondary | ICD-10-CM

## 2016-11-10 DIAGNOSIS — E860 Dehydration: Secondary | ICD-10-CM | POA: Diagnosis present

## 2016-11-10 DIAGNOSIS — Z66 Do not resuscitate: Secondary | ICD-10-CM

## 2016-11-10 DIAGNOSIS — Z7984 Long term (current) use of oral hypoglycemic drugs: Secondary | ICD-10-CM

## 2016-11-10 DIAGNOSIS — C799 Secondary malignant neoplasm of unspecified site: Secondary | ICD-10-CM

## 2016-11-10 DIAGNOSIS — K219 Gastro-esophageal reflux disease without esophagitis: Secondary | ICD-10-CM | POA: Diagnosis not present

## 2016-11-10 DIAGNOSIS — Z7982 Long term (current) use of aspirin: Secondary | ICD-10-CM | POA: Diagnosis not present

## 2016-11-10 DIAGNOSIS — R32 Unspecified urinary incontinence: Secondary | ICD-10-CM | POA: Diagnosis present

## 2016-11-10 DIAGNOSIS — I82401 Acute embolism and thrombosis of unspecified deep veins of right lower extremity: Secondary | ICD-10-CM | POA: Diagnosis present

## 2016-11-10 DIAGNOSIS — Z79899 Other long term (current) drug therapy: Secondary | ICD-10-CM

## 2016-11-10 DIAGNOSIS — E119 Type 2 diabetes mellitus without complications: Secondary | ICD-10-CM | POA: Diagnosis present

## 2016-11-10 DIAGNOSIS — Z7189 Other specified counseling: Secondary | ICD-10-CM | POA: Diagnosis not present

## 2016-11-10 DIAGNOSIS — Z880 Allergy status to penicillin: Secondary | ICD-10-CM

## 2016-11-10 DIAGNOSIS — R112 Nausea with vomiting, unspecified: Secondary | ICD-10-CM | POA: Diagnosis not present

## 2016-11-10 DIAGNOSIS — Z0189 Encounter for other specified special examinations: Secondary | ICD-10-CM

## 2016-11-10 DIAGNOSIS — R1900 Intra-abdominal and pelvic swelling, mass and lump, unspecified site: Secondary | ICD-10-CM | POA: Diagnosis present

## 2016-11-10 DIAGNOSIS — B9620 Unspecified Escherichia coli [E. coli] as the cause of diseases classified elsewhere: Secondary | ICD-10-CM | POA: Diagnosis not present

## 2016-11-10 DIAGNOSIS — Z6835 Body mass index (BMI) 35.0-35.9, adult: Secondary | ICD-10-CM

## 2016-11-10 DIAGNOSIS — E118 Type 2 diabetes mellitus with unspecified complications: Secondary | ICD-10-CM

## 2016-11-10 DIAGNOSIS — E7800 Pure hypercholesterolemia, unspecified: Secondary | ICD-10-CM | POA: Diagnosis present

## 2016-11-10 DIAGNOSIS — R197 Diarrhea, unspecified: Secondary | ICD-10-CM | POA: Diagnosis not present

## 2016-11-10 DIAGNOSIS — E43 Unspecified severe protein-calorie malnutrition: Secondary | ICD-10-CM | POA: Diagnosis present

## 2016-11-10 DIAGNOSIS — D61818 Other pancytopenia: Secondary | ICD-10-CM | POA: Diagnosis present

## 2016-11-10 DIAGNOSIS — K5649 Other impaction of intestine: Secondary | ICD-10-CM | POA: Diagnosis not present

## 2016-11-10 DIAGNOSIS — K5669 Other partial intestinal obstruction: Secondary | ICD-10-CM | POA: Diagnosis present

## 2016-11-10 DIAGNOSIS — Z95828 Presence of other vascular implants and grafts: Secondary | ICD-10-CM | POA: Diagnosis not present

## 2016-11-10 DIAGNOSIS — C786 Secondary malignant neoplasm of retroperitoneum and peritoneum: Secondary | ICD-10-CM | POA: Diagnosis present

## 2016-11-10 DIAGNOSIS — K560 Paralytic ileus: Secondary | ICD-10-CM | POA: Diagnosis not present

## 2016-11-10 DIAGNOSIS — K56699 Other intestinal obstruction unspecified as to partial versus complete obstruction: Secondary | ICD-10-CM | POA: Diagnosis not present

## 2016-11-10 DIAGNOSIS — K567 Ileus, unspecified: Secondary | ICD-10-CM | POA: Diagnosis not present

## 2016-11-10 DIAGNOSIS — C561 Malignant neoplasm of right ovary: Principal | ICD-10-CM | POA: Diagnosis present

## 2016-11-10 DIAGNOSIS — R590 Localized enlarged lymph nodes: Secondary | ICD-10-CM | POA: Diagnosis present

## 2016-11-10 DIAGNOSIS — C800 Disseminated malignant neoplasm, unspecified: Secondary | ICD-10-CM | POA: Diagnosis present

## 2016-11-10 DIAGNOSIS — M25551 Pain in right hip: Secondary | ICD-10-CM

## 2016-11-10 DIAGNOSIS — D638 Anemia in other chronic diseases classified elsewhere: Secondary | ICD-10-CM | POA: Diagnosis present

## 2016-11-10 DIAGNOSIS — K56609 Unspecified intestinal obstruction, unspecified as to partial versus complete obstruction: Secondary | ICD-10-CM | POA: Diagnosis not present

## 2016-11-10 DIAGNOSIS — Z7689 Persons encountering health services in other specified circumstances: Secondary | ICD-10-CM

## 2016-11-10 DIAGNOSIS — I82409 Acute embolism and thrombosis of unspecified deep veins of unspecified lower extremity: Secondary | ICD-10-CM | POA: Diagnosis present

## 2016-11-10 DIAGNOSIS — I82441 Acute embolism and thrombosis of right tibial vein: Secondary | ICD-10-CM

## 2016-11-10 DIAGNOSIS — R7989 Other specified abnormal findings of blood chemistry: Secondary | ICD-10-CM | POA: Diagnosis present

## 2016-11-10 DIAGNOSIS — T451X5A Adverse effect of antineoplastic and immunosuppressive drugs, initial encounter: Secondary | ICD-10-CM

## 2016-11-10 DIAGNOSIS — L539 Erythematous condition, unspecified: Secondary | ICD-10-CM

## 2016-11-10 DIAGNOSIS — Z86718 Personal history of other venous thrombosis and embolism: Secondary | ICD-10-CM | POA: Diagnosis not present

## 2016-11-10 LAB — COMPREHENSIVE METABOLIC PANEL
ALK PHOS: 123 U/L (ref 38–126)
ALT: 12 U/L — AB (ref 14–54)
AST: 24 U/L (ref 15–41)
Albumin: 3 g/dL — ABNORMAL LOW (ref 3.5–5.0)
Anion gap: 10 (ref 5–15)
BILIRUBIN TOTAL: 0.8 mg/dL (ref 0.3–1.2)
BUN: 33 mg/dL — AB (ref 6–20)
CALCIUM: 8.8 mg/dL — AB (ref 8.9–10.3)
CO2: 23 mmol/L (ref 22–32)
CREATININE: 1.15 mg/dL — AB (ref 0.44–1.00)
Chloride: 102 mmol/L (ref 101–111)
GFR calc Af Amer: 54 mL/min — ABNORMAL LOW (ref >60)
GFR calc non Af Amer: 46 mL/min — ABNORMAL LOW (ref >60)
Glucose, Bld: 150 mg/dL — ABNORMAL HIGH (ref 65–99)
Potassium: 4.1 mmol/L (ref 3.5–5.1)
Sodium: 135 mmol/L (ref 135–145)
TOTAL PROTEIN: 6.5 g/dL (ref 6.5–8.1)

## 2016-11-10 LAB — CBC WITH DIFFERENTIAL/PLATELET
BASOS ABS: 0 10*3/uL (ref 0.0–0.1)
Basophils Relative: 0 %
Eosinophils Absolute: 0.1 10*3/uL (ref 0.0–0.7)
Eosinophils Relative: 1 %
HEMATOCRIT: 37.4 % (ref 36.0–46.0)
Hemoglobin: 12.4 g/dL (ref 12.0–15.0)
LYMPHS PCT: 7 %
Lymphs Abs: 0.6 10*3/uL — ABNORMAL LOW (ref 0.7–4.0)
MCH: 29.6 pg (ref 26.0–34.0)
MCHC: 33.2 g/dL (ref 30.0–36.0)
MCV: 89.3 fL (ref 78.0–100.0)
MONO ABS: 0.6 10*3/uL (ref 0.1–1.0)
Monocytes Relative: 7 %
NEUTROS ABS: 6.8 10*3/uL (ref 1.7–7.7)
Neutrophils Relative %: 85 %
Platelets: 345 10*3/uL (ref 150–400)
RBC: 4.19 MIL/uL (ref 3.87–5.11)
RDW: 13.7 % (ref 11.5–15.5)
WBC: 8.1 10*3/uL (ref 4.0–10.5)

## 2016-11-10 LAB — I-STAT TROPONIN, ED: Troponin i, poc: 0.02 ng/mL (ref 0.00–0.08)

## 2016-11-10 LAB — LIPASE, BLOOD: Lipase: 24 U/L (ref 11–51)

## 2016-11-10 MED ORDER — LOSARTAN POTASSIUM 50 MG PO TABS: 50.0000 mg | ORAL_TABLET | Freq: Every day | ORAL | Status: DC

## 2016-11-10 MED ORDER — SODIUM CHLORIDE 0.9 % IV SOLN
INTRAVENOUS | Status: DC
  Administered 2016-11-11 – 2016-11-18 (×11): via INTRAVENOUS

## 2016-11-10 MED ORDER — ACETAMINOPHEN 650 MG RE SUPP: 650.0000 mg | Freq: Four times a day (QID) | RECTAL | Status: DC | PRN

## 2016-11-10 MED ORDER — ONDANSETRON HCL 4 MG/2ML IJ SOLN
4.0000 mg | Freq: Four times a day (QID) | INTRAMUSCULAR | Status: DC | PRN
  Administered 2016-11-14 – 2016-11-28 (×9): 4 mg via INTRAVENOUS
  Filled 2016-11-10 (×10): qty 2

## 2016-11-10 MED ORDER — HEPARIN (PORCINE) IN NACL 100-0.45 UNIT/ML-% IJ SOLN
1500.0000 [IU]/h | INTRAMUSCULAR | Status: DC
  Administered 2016-11-10: 1000 [IU]/h via INTRAVENOUS
  Administered 2016-11-11: 1150 [IU]/h via INTRAVENOUS
  Administered 2016-11-12: 1300 [IU]/h via INTRAVENOUS
  Administered 2016-11-13 (×2): 1350 [IU]/h via INTRAVENOUS
  Administered 2016-11-15 – 2016-11-16 (×2): 1500 [IU]/h via INTRAVENOUS
  Filled 2016-11-10 (×11): qty 250

## 2016-11-10 MED ORDER — INSULIN ASPART 100 UNIT/ML ~~LOC~~ SOLN
0.0000 [IU] | Freq: Three times a day (TID) | SUBCUTANEOUS | Status: DC
  Administered 2016-11-11: 2 [IU] via SUBCUTANEOUS
  Administered 2016-11-11: 1 [IU] via SUBCUTANEOUS
  Administered 2016-11-17 (×2): 2 [IU] via SUBCUTANEOUS
  Administered 2016-11-18 – 2016-11-20 (×5): 1 [IU] via SUBCUTANEOUS
  Administered 2016-11-20: 2 [IU] via SUBCUTANEOUS
  Administered 2016-11-21 (×2): 1 [IU] via SUBCUTANEOUS
  Administered 2016-11-22 (×2): 2 [IU] via SUBCUTANEOUS
  Administered 2016-11-23: 1 [IU] via SUBCUTANEOUS
  Administered 2016-11-23 (×2): 2 [IU] via SUBCUTANEOUS
  Administered 2016-11-24: 1 [IU] via SUBCUTANEOUS
  Administered 2016-11-24 (×2): 2 [IU] via SUBCUTANEOUS
  Administered 2016-11-25 – 2016-11-26 (×3): 1 [IU] via SUBCUTANEOUS
  Administered 2016-11-26: 2 [IU] via SUBCUTANEOUS
  Administered 2016-11-26 – 2016-11-29 (×4): 1 [IU] via SUBCUTANEOUS

## 2016-11-10 MED ORDER — NYSTATIN 100000 UNIT/GM EX POWD
Freq: Three times a day (TID) | CUTANEOUS | Status: DC
  Administered 2016-11-11 – 2016-11-17 (×19): via TOPICAL
  Administered 2016-11-18: 1 g via TOPICAL
  Administered 2016-11-18 – 2016-11-29 (×24): via TOPICAL
  Filled 2016-11-10 (×3): qty 15

## 2016-11-10 MED ORDER — ONDANSETRON HCL 4 MG PO TABS: 4.0000 mg | ORAL_TABLET | Freq: Four times a day (QID) | ORAL | Status: DC | PRN

## 2016-11-10 MED ORDER — ASPIRIN 325 MG PO TABS: 325.0000 mg | ORAL_TABLET | Freq: Every day | ORAL | Status: DC

## 2016-11-10 MED ORDER — METOPROLOL SUCCINATE ER 25 MG PO TB24: 25.0000 mg | ORAL_TABLET | Freq: Every day | ORAL | Status: DC

## 2016-11-10 MED ORDER — ALBUTEROL SULFATE (2.5 MG/3ML) 0.083% IN NEBU: 2.5000 mg | INHALATION_SOLUTION | Freq: Four times a day (QID) | RESPIRATORY_TRACT | Status: DC | PRN

## 2016-11-10 MED ORDER — INSULIN ASPART 100 UNIT/ML ~~LOC~~ SOLN
0.0000 [IU] | Freq: Every day | SUBCUTANEOUS | Status: DC
  Administered 2016-11-21 – 2016-11-23 (×3): 0 [IU] via SUBCUTANEOUS

## 2016-11-10 MED ORDER — SODIUM CHLORIDE 0.9 % IV BOLUS (SEPSIS)
1000.0000 mL | Freq: Once | INTRAVENOUS | Status: AC
  Administered 2016-11-10: 1000 mL via INTRAVENOUS

## 2016-11-10 MED ORDER — IOPAMIDOL (ISOVUE-370) INJECTION 76%
INTRAVENOUS | Status: AC
  Administered 2016-11-10: 100 mL via INTRAVENOUS
  Filled 2016-11-10: qty 100

## 2016-11-10 MED ORDER — GABAPENTIN 300 MG PO CAPS
300.0000 mg | ORAL_CAPSULE | Freq: Every day | ORAL | Status: DC
  Administered 2016-11-11: 300 mg via ORAL
  Filled 2016-11-10: qty 1

## 2016-11-10 MED ORDER — HEPARIN BOLUS VIA INFUSION
4000.0000 [IU] | Freq: Once | INTRAVENOUS | Status: AC
  Administered 2016-11-10: 4000 [IU] via INTRAVENOUS
  Filled 2016-11-10: qty 4000

## 2016-11-10 MED ORDER — TRAMADOL HCL 50 MG PO TABS
50.0000 mg | ORAL_TABLET | Freq: Four times a day (QID) | ORAL | Status: DC | PRN
  Administered 2016-11-11 – 2016-11-26 (×10): 50 mg via ORAL
  Filled 2016-11-10 (×11): qty 1

## 2016-11-10 MED ORDER — ACETAMINOPHEN 325 MG PO TABS
650.0000 mg | ORAL_TABLET | Freq: Four times a day (QID) | ORAL | Status: DC | PRN
Start: 2016-11-10 — End: 2016-11-29

## 2016-11-10 NOTE — ED Notes (Signed)
Dr. Smith at bedside.

## 2016-11-10 NOTE — Progress Notes (Signed)
ANTICOAGULATION CONSULT NOTE - Initial Consult  Pharmacy Consult for heparin Indication: DVT  Allergies  Allergen Reactions  . Penicillins Nausea Only    Patient Measurements:   Heparin Dosing Weight: 64 Kg  Vital Signs: Temp: 97.6 F (36.4 C) (06/21 1617) Temp Source: Oral (06/21 1617) BP: 91/62 (06/21 1617) Pulse Rate: 94 (06/21 1617)  Labs: No results for input(s): HGB, HCT, PLT, APTT, LABPROT, INR, HEPARINUNFRC, HEPRLOWMOCWT, CREATININE, CKTOTAL, CKMB, TROPONINI in the last 72 hours.  CrCl cannot be calculated (Patient's most recent lab result is older than the maximum 21 days allowed.).   Medical History: Past Medical History:  Diagnosis Date  . Depression   . Diabetes mellitus without complication ([place])   . Hypertension   . Stroke [facility])     Medications:   (Not in a hospital admission)  Assessment: Annette Farmer is a 66 yoF found to have multiple DVTs in her right leg. Patient reports no anticoagulation PTA. CBC from one week prior to admission was WNL. Pharmacy consulted to start heparin infusion.  Goal of Therapy:  Heparin level 0.3-0.7 units/ml Monitor platelets by anticoagulation protocol: Yes   Plan:  Give 4000 units bolus x 1 Start heparin infusion at 1000 units/hr Check anti-Xa level in 6 hours and daily while on heparin Continue to monitor H&H and platelets  Conn Trombetta L Neidra Girvan 11/10/2016,5:00 PM

## 2016-11-10 NOTE — H&P (Signed)
History and Physical    Annette Farmer PYP:[id number] DOB: Nov 24, 1945 DOA: 11/10/2016  Referring MD/NP/PA:Dr Darl Householder PCP: Annette Honour, MD  Patient coming from: Home  Chief Complaint: Leg swelling  HPI: Annette Farmer is a 71 y.o. female with medical history significant of HTN, DM type II, CVA, and depression;  who presents with complaints of right leg swelling over the last 1 week. Patient reports symptoms initially started a week and a half ago after falling backwards onto the floor at her friend's home. Patient reports that she bruised her right arm and his back of her shoulder, but otherwise felt okay. However, 2 days later patient began having soreness in her right hip that was followed by right leg swelling. She complains of having a achy pain in the right hip. She was seen by her PCPs office who ultimately referred her to [place]. Once there she was evaluated and sent to have a Doppler ultrasound of the bilateral lower extremities which revealed a extensive clot of the right saphenofemoral junction, common femoral, profunda, femoral, popliteal, posterior tibial, and peroneal veins. Following the procedure she was instructed to come to the emergency department for further treatment. Patient denies having any significant shortness of breath fever, chills, nausea, vomiting, or chest pain.  ED Course: Upon admission into the emergency department patient was seen to be afebrile with vital signs relatively within normal limits. Labs revealed BUN 33, creatinine 1.15, other labs relatively within normal limits. Imaging studies including CT angiogram of the chest and CT of the abdomen and pelvis revealed no pulmonary embolus. 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 There was a lobulated right pelvic mass arising from either the right adnexa or uterine fundus which may suggest primary malignancy.  Review of Systems: As per HPI otherwise 10 point  review of systems negative.   Past Medical History:  Diagnosis Date  . Depression   . Diabetes mellitus without complication ([place])   . Hypertension   . Stroke [facility])     Past Surgical History:  Procedure Laterality Date  . UMBILICAL HERNIA REPAIR       reports that she has never smoked. She has never used smokeless tobacco. She reports that she does not drink alcohol or use drugs.  Allergies  Allergen Reactions  . Penicillins Nausea Only    Has patient had a PCN reaction causing immediate rash, facial/tongue/throat swelling, SOB or lightheadedness with hypotension: NO Has patient had a PCN reaction causing severe rash involving mucus membranes or skin necrosis: NO Has patient had a PCN reaction that required hospitalization: NO Has patient had a PCN reaction occurring within the last 10 years: NO If all of the above answers are "NO", then may proceed with Cephalosporin use.     Family History  Problem Relation Age of Onset  . Parkinson's disease Mother   . Dementia Father   . Cancer Sister 55       Breast cancer    Prior to Admission medications   Medication Sig Start Date End Date Taking? Authorizing Provider  aspirin 325 MG tablet Take 325 mg by mouth daily.   Yes [provider]  COLLAGEN PO Take 6 tablets by mouth daily.   Yes [provider]  gabapentin (NEURONTIN) 300 MG capsule Take 1 capsule (300 mg total) by mouth at bedtime. 10/04/16  Yes Annette Honour, MD  ibuprofen (ADVIL,MOTRIN) 800 MG tablet Take 1 tablet (800 mg total) by mouth every 8 (eight) hours as  needed. Patient taking differently: Take 800 mg by mouth every 8 (eight) hours as needed for mild pain.  11/03/16  Yes Stallings, Zoe A, MD  KRILL OIL PO Take 1 tablet by mouth daily.   Yes [provider]  losartan-hydrochlorothiazide (HYZAAR) 50-12.5 MG tablet Take 1 tablet by mouth daily. 09/28/16  Yes Annette Honour, MD  metFORMIN (GLUCOPHAGE) 500 MG tablet Take 1 tablet (500 mg  total) by mouth daily with breakfast. 10/17/16  Yes Annette Honour, MD  metoprolol succinate (TOPROL-XL) 25 MG 24 hr tablet Take 1 tablet (25 mg total) by mouth daily. 10/17/16  Yes Annette Honour, MD  nystatin (MYCOSTATIN/NYSTOP) powder Apply topically 3 (three) times daily. TO RASH OF R GROIN 11/05/16   Wendie Agreste, MD    Physical Exam:   Constitutional: Elderly female in NAD, calm, comfortable Vitals:   11/10/16 1945 11/10/16 2000 11/10/16 2031 11/10/16 2100  BP: 98/66 (!) 143/78 120/73 102/71  Pulse: 64 65 76 74  Resp: 15 16 16 20   Temp:      TempSrc:      SpO2: 97% 97% 99% 98%   Eyes: PERRL, lids and conjunctivae normal ENMT: Mucous membranes are moist. Posterior pharynx clear of any exudate or lesions. Fair dentition.  Neck: normal, supple, no masses, no thyromegaly Respiratory: clear to auscultation bilaterally, no wheezing, no crackles. Normal respiratory effort. No accessory muscle use.  Cardiovascular: Regular rate and rhythm, no murmurs / rubs / gallops. Right lower extremity swelling noted with decreased capillary refill. Abdomen: no tenderness, no masses palpated. No hepatosplenomegaly. Bowel sounds positive.  Musculoskeletal: no clubbing / cyanosis. No joint deformity upper and lower extremities. Good ROM, no contractures. Normal muscle tone.  Skin: no rashes, lesions, ulcers. No induration Neurologic: CN 2-12 grossly intact. Sensation intact, DTR normal. Strength 5/5 in all 4.  Psychiatric: Normal judgment and insight. Alert and oriented x 3. Normal mood.     Labs on Admission: I have personally reviewed following labs and imaging studies  CBC:  Recent Labs Lab 11/10/16 1656  WBC 8.1  NEUTROABS 6.8  HGB 12.4  HCT 37.4  MCV 89.3  PLT 403   Basic Metabolic Panel:  Recent Labs Lab 11/10/16 1656  NA 135  K 4.1  CL 102  CO2 23  GLUCOSE 150*  BUN 33*  CREATININE 1.15*  CALCIUM 8.8*   GFR: Estimated Creatinine Clearance: 40.4 mL/min (A) (by C-G  formula based on SCr of 1.15 mg/dL (H)). Liver Function Tests:  Recent Labs Lab 11/10/16 1656  AST 24  ALT 12*  ALKPHOS 123  BILITOT 0.8  PROT 6.5  ALBUMIN 3.0*    Recent Labs Lab 11/10/16 1656  LIPASE 24   No results for input(s): AMMONIA in the last 168 hours. Coagulation Profile: No results for input(s): INR, PROTIME in the last 168 hours. Cardiac Enzymes: No results for input(s): CKTOTAL, CKMB, CKMBINDEX, TROPONINI in the last 168 hours. BNP (last 3 results) No results for input(s): PROBNP in the last 8760 hours. HbA1C: No results for input(s): HGBA1C in the last 72 hours. CBG: No results for input(s): GLUCAP in the last 168 hours. Lipid Profile: No results for input(s): CHOL, HDL, LDLCALC, TRIG, CHOLHDL, LDLDIRECT in the last 72 hours. Thyroid Function Tests: No results for input(s): TSH, T4TOTAL, FREET4, T3FREE, THYROIDAB in the last 72 hours. Anemia Panel: No results for input(s): VITAMINB12, FOLATE, FERRITIN, TIBC, IRON, RETICCTPCT in the last 72 hours. Urine analysis:    Component Value Date/Time  COLORURINE YELLOW 01/06/2011 0008   APPEARANCEUR CLOUDY (A) 01/06/2011 0008   LABSPEC 1.009 01/06/2011 0008   PHURINE 6.5 01/06/2011 0008   GLUCOSEU NEGATIVE 01/06/2011 0008   HGBUR NEGATIVE 01/06/2011 0008   BILIRUBINUR negative 01/27/2016 1759   BILIRUBINUR neg 04/08/2012 1424   KETONESUR trace (5) (A) 01/27/2016 1759   KETONESUR 15 (A) 01/06/2011 0008   PROTEINUR trace (A) 01/27/2016 1759   PROTEINUR neg 04/08/2012 1424   PROTEINUR NEGATIVE 01/06/2011 0008   UROBILINOGEN 0.2 01/27/2016 1759   UROBILINOGEN 1.0 01/06/2011 0008   NITRITE Positive (A) 01/27/2016 1759   NITRITE positive 04/08/2012 1424   NITRITE NEGATIVE 01/06/2011 0008   LEUKOCYTESUR small (1+) (A) 01/27/2016 1759   Sepsis Labs: No results found for this or any previous visit (from the past 240 hour(s)).   Radiological Exams on Admission: Dg Chest 2 View  Result Date:  11/10/2016 CLINICAL DATA:  Chest pain and right lower extremity DVT EXAM: CHEST  2 VIEW COMPARISON:  Chest radiograph 08/30/2015 FINDINGS: Shallow lung inflation without focal airspace disease or pulmonary edema. No pleural effusion or pneumothorax. Unchanged cardiomediastinal contours. IMPRESSION: No active cardiopulmonary disease. Electronically Signed   By: Ulyses Jarred M.D.   On: 11/10/2016 18:25   Ct Angio Chest Pe W And/or Wo Contrast  Result Date: 11/10/2016 CLINICAL DATA:  Shortness of breath, abdominal pain, right lower extremity DVT EXAM: CT ANGIOGRAPHY CHEST CT ABDOMEN AND PELVIS WITH CONTRAST TECHNIQUE: Multidetector CT imaging of the chest was performed using the standard protocol during bolus administration of intravenous contrast. Multiplanar CT image reconstructions and MIPs were obtained to evaluate the vascular anatomy. Multidetector CT imaging of the abdomen and pelvis was performed using the standard protocol during bolus administration of intravenous contrast. CONTRAST:  100 mL Isovue 370 IV COMPARISON:  Chest radiograph 12/24/2003 and 11/10/2016 FINDINGS: CTA CHEST FINDINGS Cardiovascular: Contrast injection is sufficient to demonstrate satisfactory opacification of the pulmonary arteries to the segmental level. There is no pulmonary embolus. The main pulmonary artery is within normal limits for size. There is no CT evidence of acute right heart strain. There is calcific aortic atherosclerosis. There are multifocal coronary artery calcifications. There is a normal 3-vessel arch branching pattern. Heart size is normal, without pericardial effusion. Mediastinum/Nodes: No mediastinal, hilar or axillary lymphadenopathy. The visualized thyroid and thoracic esophageal course are unremarkable. Lungs/Pleura: No pulmonary nodules or masses. No pleural effusion or pneumothorax. No focal airspace consolidation. No focal pleural abnormality. Musculoskeletal: No chest wall abnormality. No acute or  significant osseous findings. Review of the MIP images confirms the above findings. CT ABDOMEN and PELVIS FINDINGS Hepatobiliary: Moderate volume perihepatic ascites. No focal liver lesion. The gallbladder is filled with stones. Pancreas: Normal pancreatic contours and enhancement. No peripancreatic fluid collection or pancreatic ductal dilatation. Spleen: Medium volume perisplenic ascites. There is peritoneal nodularity particularly in the left upper quadrant and lower midline abdomen. Adrenals/Urinary Tract: Normal adrenal glands. No hydronephrosis or solid renal mass. Stomach/Bowel: There is no hiatal hernia. The stomach and duodenum are normal. There is no dilated small bowel or enteric inflammation. There is no colonic abnormality. Nonvisualization of the appendix. Vascular/Lymphatic: There is atherosclerotic calcification of the non aneurysmal abdominal aorta. Aortocaval lymph nodes measure up to 8 mm. Reproductive: There is a multilobulated mass of the right pelvis. It is unclear whether it arises from the uterine fundus to the right adnexa. There is a left ovarian cyst measuring 4 cm. Musculoskeletal: No lytic or blastic osseous lesion. Normal visualized extrathoracic and extraperitoneal soft  tissues. Other: No contributory non-categorized findings. IMPRESSION: 1. No pulmonary embolus. 2. Extensive anterior peritoneal nodular implants, consistent with carcinomatosis. Perihepatic and perisplenic ascites is likely malignant. 3. Lobulated right pelvic mass arising from either the right adnexa or the uterine fundus. This may be the primary malignancy. 4. Gallbladder filled stones but no other evidence of acute cholecystitis. 5. Multiple subcentimeter retroperitoneal nodes. In this context, these could indicate lymphatic spread of disease. Electronically Signed   By: Ulyses Jarred M.D.   On: 11/10/2016 19:29   Ct Abdomen Pelvis W Contrast  Result Date: 11/10/2016 CLINICAL DATA:  Shortness of breath, abdominal  pain, right lower extremity DVT EXAM: CT ANGIOGRAPHY CHEST CT ABDOMEN AND PELVIS WITH CONTRAST TECHNIQUE: Multidetector CT imaging of the chest was performed using the standard protocol during bolus administration of intravenous contrast. Multiplanar CT image reconstructions and MIPs were obtained to evaluate the vascular anatomy. Multidetector CT imaging of the abdomen and pelvis was performed using the standard protocol during bolus administration of intravenous contrast. CONTRAST:  100 mL Isovue 370 IV COMPARISON:  Chest radiograph 12/24/2003 and 11/10/2016 FINDINGS: CTA CHEST FINDINGS Cardiovascular: Contrast injection is sufficient to demonstrate satisfactory opacification of the pulmonary arteries to the segmental level. There is no pulmonary embolus. The main pulmonary artery is within normal limits for size. There is no CT evidence of acute right heart strain. There is calcific aortic atherosclerosis. There are multifocal coronary artery calcifications. There is a normal 3-vessel arch branching pattern. Heart size is normal, without pericardial effusion. Mediastinum/Nodes: No mediastinal, hilar or axillary lymphadenopathy. The visualized thyroid and thoracic esophageal course are unremarkable. Lungs/Pleura: No pulmonary nodules or masses. No pleural effusion or pneumothorax. No focal airspace consolidation. No focal pleural abnormality. Musculoskeletal: No chest wall abnormality. No acute or significant osseous findings. Review of the MIP images confirms the above findings. CT ABDOMEN and PELVIS FINDINGS Hepatobiliary: Moderate volume perihepatic ascites. No focal liver lesion. The gallbladder is filled with stones. Pancreas: Normal pancreatic contours and enhancement. No peripancreatic fluid collection or pancreatic ductal dilatation. Spleen: Medium volume perisplenic ascites. There is peritoneal nodularity particularly in the left upper quadrant and lower midline abdomen. Adrenals/Urinary Tract: Normal  adrenal glands. No hydronephrosis or solid renal mass. Stomach/Bowel: There is no hiatal hernia. The stomach and duodenum are normal. There is no dilated small bowel or enteric inflammation. There is no colonic abnormality. Nonvisualization of the appendix. Vascular/Lymphatic: There is atherosclerotic calcification of the non aneurysmal abdominal aorta. Aortocaval lymph nodes measure up to 8 mm. Reproductive: There is a multilobulated mass of the right pelvis. It is unclear whether it arises from the uterine fundus to the right adnexa. There is a left ovarian cyst measuring 4 cm. Musculoskeletal: No lytic or blastic osseous lesion. Normal visualized extrathoracic and extraperitoneal soft tissues. Other: No contributory non-categorized findings. IMPRESSION: 1. No pulmonary embolus. 2. Extensive anterior peritoneal nodular implants, consistent with carcinomatosis. Perihepatic and perisplenic ascites is likely malignant. 3. Lobulated right pelvic mass arising from either the right adnexa or the uterine fundus. This may be the primary malignancy. 4. Gallbladder filled stones but no other evidence of acute cholecystitis. 5. Multiple subcentimeter retroperitoneal nodes. In this context, these could indicate lymphatic spread of disease. Electronically Signed   By: Ulyses Jarred M.D.   On: 11/10/2016 19:29    EKG: Independently reviewed. Sinus rhythm with premature atrial complex  Assessment/Plan Right leg DVT: Acute. Patient presented with one week of right leg swelling. Ultrasound shows extensive clot. Patient was started on heparin drip  per protocol. Risk factors for DVT include right pelvic mass. - Admit to telemetry bed - Heparin drip per protocol - Tramadol prn pain - Due to extensiveness of clots question need of thrombolysis  Right pelvis mass with carcinomatosis: Acute. As seen on CT scan.  - Will likely need biopsy for further characterization and treatment plan   Acute kidney injury: Creatinine  previously had been 0.82-0.88 upon review of records, but patient presents with a creatinine of 1.15 and BUN 33 to suggest prerenal cause of symptoms. - IV fluids and normal saline at 75 ml/ per hour as tolerated - Follow-up Bmp in a.m - Hold nephrotoxic agents  Essential hypertension  - Hold HCTZ  - Continue losartan and metoprolol  DM type II: Chronic. Patient on metformin at baseline. - Hypoglycemic protocol - Hold metformin - Cbg q AC/HS with sensitive SSI  DVT prophylaxis: Heparin Code Status: Full  Family Communication: No family present at bedside  Disposition Plan: Discharge once medically  Consults called: None Admission status: Inpatient   Norval Morton MD [facility] Pager [phone]  If 7PM-7AM, please contact night-coverage [email or web address] Password [facility]  11/10/2016, 9:31 PM

## 2016-11-10 NOTE — ED Provider Notes (Signed)
[facility] DEPT Provider Note   CSN: [id number] Arrival date & time: 11/10/16  1605     History   Chief Complaint Chief Complaint  Patient presents with  . DVT    HPI Annette Farmer is a 71 y.o. female hx of DM, HTN, Here presenting with right leg swelling, abdominal pain, hypotension. Patient has been having right leg swelling for the last week or so. Also some associated abdominal discomfort as well. Denies any chest pain or shortness of breath. Denies any recent travel. Of note, patient states that she fell on her buttock about a week ago but denies any head injury or loss of consciousness. Patient had outpatient ultrasound that showed extensive DVT of the right central femoral junction, femoral, popliteal, posterior tibial and peroneal veins. Patient never had a DVT before. Patient states that losartan was added recently and her blood pressure has been running slightly low but denies any fevers or chills or cough.     The history is provided by the patient.    Past Medical History:  Diagnosis Date  . Depression   . Diabetes mellitus without complication ([place])   . Hypertension   . Stroke [facility])     Patient Active Problem List   Diagnosis Date Noted  . Refusal of statin medication by patient 09/28/2016  . Hemiparesis affecting right side as late effect of cerebrovascular accident ([place]) 04/23/2012  . Type 2 diabetes mellitus ([place]) 07/20/2006  . HYPERCHOLESTEROLEMIA 07/20/2006  . OBESITY, NOS 07/20/2006  . HYPERTENSION, BENIGN SYSTEMIC 07/20/2006  . HEMORRHOIDS, NOS 07/20/2006  . CONSTIPATION 07/20/2006  . INCONTINENCE, STRESS, FEMALE 07/20/2006  . MENOPAUSAL SYNDROME 07/20/2006    Past Surgical History:  Procedure Laterality Date  . UMBILICAL HERNIA REPAIR      OB History    No data available       Home Medications    Prior to Admission medications   Medication Sig Start Date End Date Taking? Authorizing Provider  aspirin 325 MG tablet Take 325 mg by  mouth daily.   Yes [provider]  COLLAGEN PO Take 6 tablets by mouth daily.   Yes [provider]  gabapentin (NEURONTIN) 300 MG capsule Take 1 capsule (300 mg total) by mouth at bedtime. 10/04/16  Yes Wardell Honour, MD  ibuprofen (ADVIL,MOTRIN) 800 MG tablet Take 1 tablet (800 mg total) by mouth every 8 (eight) hours as needed. Patient taking differently: Take 800 mg by mouth every 8 (eight) hours as needed for mild pain.  11/03/16  Yes Stallings, Zoe A, MD  KRILL OIL PO Take 1 tablet by mouth daily.   Yes [provider]  losartan-hydrochlorothiazide (HYZAAR) 50-12.5 MG tablet Take 1 tablet by mouth daily. 09/28/16  Yes Wardell Honour, MD  metFORMIN (GLUCOPHAGE) 500 MG tablet Take 1 tablet (500 mg total) by mouth daily with breakfast. 10/17/16  Yes Wardell Honour, MD  metoprolol succinate (TOPROL-XL) 25 MG 24 hr tablet Take 1 tablet (25 mg total) by mouth daily. 10/17/16  Yes Wardell Honour, MD  nystatin (MYCOSTATIN/NYSTOP) powder Apply topically 3 (three) times daily. TO RASH OF R GROIN 11/05/16   Wendie Agreste, MD    Family History Family History  Problem Relation Age of Onset  . Parkinson's disease Mother   . Dementia Father   . Cancer Sister 23       Breast cancer    Social History Social History  Substance Use Topics  . Smoking status: Never Smoker  .  Smokeless tobacco: Never Used  . Alcohol use No     Allergies   Penicillins   Review of Systems Review of Systems  Cardiovascular: Positive for leg swelling.  All other systems reviewed and are negative.    Physical Exam Updated Vital Signs BP 120/73   Pulse 76   Temp 97.6 F (36.4 C) (Oral)   Resp 16   SpO2 99%   Physical Exam  Constitutional: She is oriented to person, place, and time.  Chronically ill   HENT:  Head: Normocephalic.  Mouth/Throat: Oropharynx is clear and moist.  Eyes: Conjunctivae and EOM are normal. Pupils are equal, round, and reactive to light.  Neck:  Normal range of motion. Neck supple.  Cardiovascular: Normal rate, regular rhythm and normal heart sounds.   Pulmonary/Chest: Effort normal and breath sounds normal. No respiratory distress. She has no wheezes. She has no rales.  Abdominal: Soft. Bowel sounds are normal. She exhibits no distension. There is no tenderness. There is no guarding.  Musculoskeletal:  2+ edema R leg. Diminished DP and PT pulses but able to get good doppler waveform   Neurological: She is alert and oriented to person, place, and time. No cranial nerve deficit. Coordination normal.  Skin: Skin is warm.  Psychiatric: She has a normal mood and affect.  Nursing note and vitals reviewed.    ED Treatments / Results  Labs (all labs ordered are listed, but only abnormal results are displayed) Labs Reviewed  CBC WITH DIFFERENTIAL/PLATELET - Abnormal; Notable for the following:       Result Value   Lymphs Abs 0.6 (*)    All other components within normal limits  COMPREHENSIVE METABOLIC PANEL - Abnormal; Notable for the following:    Glucose, Bld 150 (*)    BUN 33 (*)    Creatinine, Ser 1.15 (*)    Calcium 8.8 (*)    Albumin 3.0 (*)    ALT 12 (*)    GFR calc non Af Amer 46 (*)    GFR calc Af Amer 54 (*)    All other components within normal limits  LIPASE, BLOOD  HEPARIN LEVEL (UNFRACTIONATED)  HEPARIN LEVEL (UNFRACTIONATED)  CBC  CBC  I-STAT TROPOININ, ED    EKG  EKG Interpretation  Date/Time:  Thursday November 10 2016 16:21:42 EDT Ventricular Rate:  84 PR Interval:  206 QRS Duration: 82 QT Interval:  362 QTC Calculation: 427 R Axis:   -5 Text Interpretation:  Sinus rhythm with Premature atrial complexes Low voltage QRS Inferior infarct , age undetermined Cannot rule out Anterior infarct , age undetermined Abnormal ECG No significant change since last tracing Confirmed by Wandra Arthurs [phone]) on 11/10/2016 4:40:08 PM       Radiology Dg Chest 2 View  Result Date: 11/10/2016 CLINICAL DATA:  Chest  pain and right lower extremity DVT EXAM: CHEST  2 VIEW COMPARISON:  Chest radiograph 08/30/2015 FINDINGS: Shallow lung inflation without focal airspace disease or pulmonary edema. No pleural effusion or pneumothorax. Unchanged cardiomediastinal contours. IMPRESSION: No active cardiopulmonary disease. Electronically Signed   By: Ulyses Jarred M.D.   On: 11/10/2016 18:25   Ct Angio Chest Pe W And/or Wo Contrast  Result Date: 11/10/2016 CLINICAL DATA:  Shortness of breath, abdominal pain, right lower extremity DVT EXAM: CT ANGIOGRAPHY CHEST CT ABDOMEN AND PELVIS WITH CONTRAST TECHNIQUE: Multidetector CT imaging of the chest was performed using the standard protocol during bolus administration of intravenous contrast. Multiplanar CT image reconstructions and MIPs were obtained to  evaluate the vascular anatomy. Multidetector CT imaging of the abdomen and pelvis was performed using the standard protocol during bolus administration of intravenous contrast. CONTRAST:  100 mL Isovue 370 IV COMPARISON:  Chest radiograph 12/24/2003 and 11/10/2016 FINDINGS: CTA CHEST FINDINGS Cardiovascular: Contrast injection is sufficient to demonstrate satisfactory opacification of the pulmonary arteries to the segmental level. There is no pulmonary embolus. The main pulmonary artery is within normal limits for size. There is no CT evidence of acute right heart strain. There is calcific aortic atherosclerosis. There are multifocal coronary artery calcifications. There is a normal 3-vessel arch branching pattern. Heart size is normal, without pericardial effusion. Mediastinum/Nodes: No mediastinal, hilar or axillary lymphadenopathy. The visualized thyroid and thoracic esophageal course are unremarkable. Lungs/Pleura: No pulmonary nodules or masses. No pleural effusion or pneumothorax. No focal airspace consolidation. No focal pleural abnormality. Musculoskeletal: No chest wall abnormality. No acute or significant osseous findings. Review  of the MIP images confirms the above findings. CT ABDOMEN and PELVIS FINDINGS Hepatobiliary: Moderate volume perihepatic ascites. No focal liver lesion. The gallbladder is filled with stones. Pancreas: Normal pancreatic contours and enhancement. No peripancreatic fluid collection or pancreatic ductal dilatation. Spleen: Medium volume perisplenic ascites. There is peritoneal nodularity particularly in the left upper quadrant and lower midline abdomen. Adrenals/Urinary Tract: Normal adrenal glands. No hydronephrosis or solid renal mass. Stomach/Bowel: There is no hiatal hernia. The stomach and duodenum are normal. There is no dilated small bowel or enteric inflammation. There is no colonic abnormality. Nonvisualization of the appendix. Vascular/Lymphatic: There is atherosclerotic calcification of the non aneurysmal abdominal aorta. Aortocaval lymph nodes measure up to 8 mm. Reproductive: There is a multilobulated mass of the right pelvis. It is unclear whether it arises from the uterine fundus to the right adnexa. There is a left ovarian cyst measuring 4 cm. Musculoskeletal: No lytic or blastic osseous lesion. Normal visualized extrathoracic and extraperitoneal soft tissues. Other: No contributory non-categorized findings. IMPRESSION: 1. No pulmonary embolus. 2. Extensive anterior peritoneal nodular implants, consistent with carcinomatosis. Perihepatic and perisplenic ascites is likely malignant. 3. Lobulated right pelvic mass arising from either the right adnexa or the uterine fundus. This may be the primary malignancy. 4. Gallbladder filled stones but no other evidence of acute cholecystitis. 5. Multiple subcentimeter retroperitoneal nodes. In this context, these could indicate lymphatic spread of disease. Electronically Signed   By: Ulyses Jarred M.D.   On: 11/10/2016 19:29   Ct Abdomen Pelvis W Contrast  Result Date: 11/10/2016 CLINICAL DATA:  Shortness of breath, abdominal pain, right lower extremity DVT  EXAM: CT ANGIOGRAPHY CHEST CT ABDOMEN AND PELVIS WITH CONTRAST TECHNIQUE: Multidetector CT imaging of the chest was performed using the standard protocol during bolus administration of intravenous contrast. Multiplanar CT image reconstructions and MIPs were obtained to evaluate the vascular anatomy. Multidetector CT imaging of the abdomen and pelvis was performed using the standard protocol during bolus administration of intravenous contrast. CONTRAST:  100 mL Isovue 370 IV COMPARISON:  Chest radiograph 12/24/2003 and 11/10/2016 FINDINGS: CTA CHEST FINDINGS Cardiovascular: Contrast injection is sufficient to demonstrate satisfactory opacification of the pulmonary arteries to the segmental level. There is no pulmonary embolus. The main pulmonary artery is within normal limits for size. There is no CT evidence of acute right heart strain. There is calcific aortic atherosclerosis. There are multifocal coronary artery calcifications. There is a normal 3-vessel arch branching pattern. Heart size is normal, without pericardial effusion. Mediastinum/Nodes: No mediastinal, hilar or axillary lymphadenopathy. The visualized thyroid and thoracic esophageal course are  unremarkable. Lungs/Pleura: No pulmonary nodules or masses. No pleural effusion or pneumothorax. No focal airspace consolidation. No focal pleural abnormality. Musculoskeletal: No chest wall abnormality. No acute or significant osseous findings. Review of the MIP images confirms the above findings. CT ABDOMEN and PELVIS FINDINGS Hepatobiliary: Moderate volume perihepatic ascites. No focal liver lesion. The gallbladder is filled with stones. Pancreas: Normal pancreatic contours and enhancement. No peripancreatic fluid collection or pancreatic ductal dilatation. Spleen: Medium volume perisplenic ascites. There is peritoneal nodularity particularly in the left upper quadrant and lower midline abdomen. Adrenals/Urinary Tract: Normal adrenal glands. No hydronephrosis  or solid renal mass. Stomach/Bowel: There is no hiatal hernia. The stomach and duodenum are normal. There is no dilated small bowel or enteric inflammation. There is no colonic abnormality. Nonvisualization of the appendix. Vascular/Lymphatic: There is atherosclerotic calcification of the non aneurysmal abdominal aorta. Aortocaval lymph nodes measure up to 8 mm. Reproductive: There is a multilobulated mass of the right pelvis. It is unclear whether it arises from the uterine fundus to the right adnexa. There is a left ovarian cyst measuring 4 cm. Musculoskeletal: No lytic or blastic osseous lesion. Normal visualized extrathoracic and extraperitoneal soft tissues. Other: No contributory non-categorized findings. IMPRESSION: 1. No pulmonary embolus. 2. Extensive anterior peritoneal nodular implants, consistent with carcinomatosis. Perihepatic and perisplenic ascites is likely malignant. 3. Lobulated right pelvic mass arising from either the right adnexa or the uterine fundus. This may be the primary malignancy. 4. Gallbladder filled stones but no other evidence of acute cholecystitis. 5. Multiple subcentimeter retroperitoneal nodes. In this context, these could indicate lymphatic spread of disease. Electronically Signed   By: Ulyses Jarred M.D.   On: 11/10/2016 19:29    Procedures Procedures (including critical care time)  CRITICAL CARE Performed by: Wandra Arthurs   Total critical care time: 30 minutes  Critical care time was exclusive of separately billable procedures and treating other patients.  Critical care was necessary to treat or prevent imminent or life-threatening deterioration.  Critical care was time spent personally by me on the following activities: development of treatment plan with patient and/or surrogate as well as nursing, discussions with consultants, evaluation of patient's response to treatment, examination of patient, obtaining history from patient or surrogate, ordering and  performing treatments and interventions, ordering and review of laboratory studies, ordering and review of radiographic studies, pulse oximetry and re-evaluation of patient's condition.   Medications Ordered in ED Medications  heparin ADULT infusion 100 units/mL (25000 units/246mL sodium chloride 0.45%) (1,000 Units/hr Intravenous New Bag/Given 11/10/16 1709)  sodium chloride 0.9 % bolus 1,000 mL (0 mLs Intravenous Stopped 11/10/16 2022)  heparin bolus via infusion 4,000 Units (4,000 Units Intravenous Bolus from Bag 11/10/16 1709)  iopamidol (ISOVUE-370) 76 % injection (100 mLs Intravenous Contrast Given 11/10/16 1822)     Initial Impression / Assessment and Plan / ED Course  I have reviewed the triage vital signs and the nursing notes.  Pertinent labs & imaging results that were available during my care of the patient were reviewed by me and considered in my medical decision making (see chart for details).    Annette Farmer is a 71 y.o. female here with R leg pain and swelling, abdominal pain, chest pain, hypotension. States that BP meds recently increased. Denies shortness of breath. Given extensive DVT, I am concerned for possible PE given hypotension. Also consider anatomic abnormality in the iliac causing compression of iliac with extensive DVT. So will get CT angio chest and CT ab/pel. Will start  on heparin.   8:56 PM CT showed pelvic mass with possible carcinomatosis likely causing extensive DVT, no PE on CT. Started on heparin. BP improved with IVF. Will admit for cancer workup and anticoagulation.    Final Clinical Impressions(s) / ED Diagnoses   Final diagnoses:  None    New Prescriptions New Prescriptions   No medications on file     Drenda Freeze, MD 11/10/16 2057

## 2016-11-10 NOTE — ED Notes (Signed)
Pt and family made aware of bed assignment 

## 2016-11-10 NOTE — ED Triage Notes (Signed)
Pt arrives with report of being told to come to the ER due to having multiple blood clots to her right leg. According to medical record, "Today's bilateral lower extremity venous duplex is positive for acute DVT in the right saphenofemoral junction, common femoral, profunda, femoral, popliteal, posterior tibial, and peroneal veins." Her right leg appears to be slightly mottled but cap refill to toes is <3 seconds. When asked about chest pain, she stated she only has chest pain when she moves her arms.

## 2016-11-10 NOTE — ED Notes (Signed)
Patient eating dinner.

## 2016-11-10 NOTE — Progress Notes (Signed)
Today's bilateral lower extremity venous duplex is positive for acute DVT in the right saphenofemoral junction, common femoral, profunda, femoral, popliteal, posterior tibial, and peroneal veins. Preliminary results given to Foley. Patient went to [facility] Emergency Department.

## 2016-11-11 ENCOUNTER — Inpatient Hospital Stay (HOSPITAL_COMMUNITY): Payer: Medicare PPO

## 2016-11-11 ENCOUNTER — Encounter (HOSPITAL_COMMUNITY): Payer: Self-pay

## 2016-11-11 DIAGNOSIS — I82811 Embolism and thrombosis of superficial veins of right lower extremities: Secondary | ICD-10-CM

## 2016-11-11 DIAGNOSIS — R1900 Intra-abdominal and pelvic swelling, mass and lump, unspecified site: Secondary | ICD-10-CM | POA: Diagnosis present

## 2016-11-11 DIAGNOSIS — C800 Disseminated malignant neoplasm, unspecified: Secondary | ICD-10-CM

## 2016-11-11 DIAGNOSIS — N179 Acute kidney failure, unspecified: Secondary | ICD-10-CM | POA: Diagnosis present

## 2016-11-11 HISTORY — PX: IR PARACENTESIS: IMG2679

## 2016-11-11 LAB — GLUCOSE, CAPILLARY
GLUCOSE-CAPILLARY: 160 mg/dL — AB (ref 65–99)
GLUCOSE-CAPILLARY: 155 mg/dL — AB (ref 65–99)
Glucose-Capillary: 121 mg/dL — ABNORMAL HIGH (ref 65–99)
Glucose-Capillary: 135 mg/dL — ABNORMAL HIGH (ref 65–99)
Glucose-Capillary: 95 mg/dL (ref 65–99)

## 2016-11-11 LAB — CBC
HCT: 40.4 % (ref 36.0–46.0)
HEMOGLOBIN: 13.2 g/dL (ref 12.0–15.0)
MCH: 29.7 pg (ref 26.0–34.0)
MCHC: 32.7 g/dL (ref 30.0–36.0)
MCV: 90.8 fL (ref 78.0–100.0)
Platelets: 298 10*3/uL (ref 150–400)
RBC: 4.45 MIL/uL (ref 3.87–5.11)
RDW: 13.7 % (ref 11.5–15.5)
WBC: 7.5 10*3/uL (ref 4.0–10.5)

## 2016-11-11 LAB — BODY FLUID CELL COUNT WITH DIFFERENTIAL
Lymphs, Fluid: 38 %
Monocyte-Macrophage-Serous Fluid: 40 % — ABNORMAL LOW (ref 50–90)
Neutrophil Count, Fluid: 22 % (ref 0–25)
Total Nucleated Cell Count, Fluid: 2066 cu mm — ABNORMAL HIGH (ref 0–1000)

## 2016-11-11 LAB — BASIC METABOLIC PANEL
ANION GAP: 9 (ref 5–15)
BUN: 28 mg/dL — ABNORMAL HIGH (ref 6–20)
CALCIUM: 8.8 mg/dL — AB (ref 8.9–10.3)
CO2: 24 mmol/L (ref 22–32)
Chloride: 101 mmol/L (ref 101–111)
Creatinine, Ser: 1.12 mg/dL — ABNORMAL HIGH (ref 0.44–1.00)
GFR calc Af Amer: 55 mL/min — ABNORMAL LOW (ref >60)
GFR calc non Af Amer: 48 mL/min — ABNORMAL LOW (ref >60)
GLUCOSE: 129 mg/dL — AB (ref 65–99)
POTASSIUM: 4.6 mmol/L (ref 3.5–5.1)
Sodium: 134 mmol/L — ABNORMAL LOW (ref 135–145)

## 2016-11-11 LAB — LACTATE DEHYDROGENASE, PLEURAL OR PERITONEAL FLUID: LD FL: 298 U/L — AB (ref 3–23)

## 2016-11-11 LAB — HEPARIN LEVEL (UNFRACTIONATED)
HEPARIN UNFRACTIONATED: 0.2 [IU]/mL — AB (ref 0.30–0.70)
HEPARIN UNFRACTIONATED: 0.33 [IU]/mL (ref 0.30–0.70)
Heparin Unfractionated: 0.13 IU/mL — ABNORMAL LOW (ref 0.30–0.70)

## 2016-11-11 LAB — PROTIME-INR
INR: 0.96
Prothrombin Time: 12.8 seconds (ref 11.4–15.2)

## 2016-11-11 MED ORDER — HEPARIN BOLUS VIA INFUSION
2000.0000 [IU] | Freq: Once | INTRAVENOUS | Status: AC
  Administered 2016-11-11: 2000 [IU] via INTRAVENOUS
  Filled 2016-11-11: qty 2000

## 2016-11-11 MED ORDER — LIDOCAINE HCL 1 % IJ SOLN
INTRAMUSCULAR | Status: AC | PRN
  Administered 2016-11-11: 10 mL

## 2016-11-11 MED ORDER — GABAPENTIN 300 MG PO CAPS
300.0000 mg | ORAL_CAPSULE | Freq: Every day | ORAL | Status: DC
  Administered 2016-11-12 – 2016-11-28 (×15): 300 mg via ORAL
  Filled 2016-11-11 (×16): qty 1

## 2016-11-11 MED ORDER — LIDOCAINE HCL (PF) 1 % IJ SOLN
INTRAMUSCULAR | Status: AC
  Filled 2016-11-11: qty 30

## 2016-11-11 MED ORDER — ASPIRIN 325 MG PO TABS
325.0000 mg | ORAL_TABLET | Freq: Every day | ORAL | Status: DC
  Administered 2016-11-11 – 2016-11-16 (×6): 325 mg via ORAL
  Filled 2016-11-11 (×6): qty 1

## 2016-11-11 MED ORDER — ALUM & MAG HYDROXIDE-SIMETH 200-200-20 MG/5ML PO SUSP
30.0000 mL | ORAL | Status: DC | PRN
  Administered 2016-11-11 – 2016-11-28 (×9): 30 mL via ORAL
  Filled 2016-11-11 (×12): qty 30

## 2016-11-11 MED ORDER — METOPROLOL SUCCINATE ER 25 MG PO TB24
25.0000 mg | ORAL_TABLET | Freq: Every day | ORAL | Status: DC
  Administered 2016-11-11 – 2016-11-28 (×16): 25 mg via ORAL
  Filled 2016-11-11 (×18): qty 1

## 2016-11-11 MED ORDER — LOSARTAN POTASSIUM 50 MG PO TABS
50.0000 mg | ORAL_TABLET | Freq: Every day | ORAL | Status: DC
  Administered 2016-11-11 – 2016-11-18 (×7): 50 mg via ORAL
  Filled 2016-11-11 (×8): qty 1

## 2016-11-11 NOTE — Procedures (Signed)
   US guided RUQ paracentesis 420 cc dark yellow fluid obtained Sent for labs per MD  Await cytology If neg may need Outpt biopsy  Pt tolerated well

## 2016-11-11 NOTE — Progress Notes (Signed)
ANTICOAGULATION CONSULT NOTE - Follow Up Consult  Pharmacy Consult for Heparin  Indication: DVT  Patient Measurements: Height: 5' 1.5" (156.2 cm) Weight: 161 lb (73 kg) IBW/kg (Calculated) : 48.95  Heparin dosing wt: 64.7 kg  Vital Signs: Temp: 98.1 F (36.7 C) (06/22 0512) Temp Source: Oral (06/22 0512) BP: 117/66 (06/22 0512) Pulse Rate: 66 (06/22 0512)  Labs:  Recent Labs  11/10/16 1656 11/11/16 0130  HGB 12.4 13.2  HCT 37.4 40.4  PLT 345 298  HEPARINUNFRC  --  0.13*  CREATININE 1.15* 1.12*   Estimated Creatinine Clearance: 42 mL/min (A) (by C-G formula based on SCr of 1.12 mg/dL (H)).  Assessment: 71 y/o F with new-onset DVT. Not on anticoagulation PTA. CBC wnl.  Heparin level subtherapeutic at 0.2 after rate increase. No bleed or IV line issues per RN.  Goal of Therapy:  Heparin level 0.3-0.7 units/ml Monitor platelets by anticoagulation protocol: Yes   Plan:  Increase heparin drip to 1300 units/hr 6h heparin level Daily heparin level/CBC Monitor for bleeding   Elicia Lamp, PharmD, BCPS Clinical Pharmacist Rx Phone # for today: [phone] After 3:30PM, please call Main Rx: #[id number] 11/11/2016 9:27 AM

## 2016-11-11 NOTE — Progress Notes (Signed)
ANTICOAGULATION CONSULT NOTE - Follow Up Consult  Pharmacy Consult for Heparin  Indication: DVT  Patient Measurements: Height: 5' 1.5" (156.2 cm) Weight: 161 lb (73 kg) IBW/kg (Calculated) : 48.95  Vital Signs: Temp: 98.7 F (37.1 C) (06/22 0040) Temp Source: Oral (06/22 0040) BP: 119/66 (06/22 0040) Pulse Rate: 63 (06/22 0040)  Labs:  Recent Labs  11/10/16 1656 11/11/16 0130  HGB 12.4 13.2  HCT 37.4 40.4  PLT 345 298  HEPARINUNFRC  --  0.13*  CREATININE 1.15* 1.12*   Estimated Creatinine Clearance: 42 mL/min (A) (by C-G formula based on SCr of 1.12 mg/dL (H)).  Assessment: 71 y/o F with new onset DVT, initial heparin level this AM is low, no issues per RN.   Goal of Therapy:  Heparin level 0.3-0.7 units/ml Monitor platelets by anticoagulation protocol: Yes   Plan:  Heparin 2000 units [employer] heparin drip to 1150 units/hr 1200 HL Daily CBC/HL Monitor for bleeding   Narda Bonds 11/11/2016,3:05 AM

## 2016-11-11 NOTE — Consult Note (Signed)
Reason for Consult: Pelvic mass Referring Physician: Hosie Poisson, MD  Annette Farmer is an 71 y.o. female.  HPI: Asked to consult on this patient with a newly diagnosed pelvic mass and concomitant RLE DVT.  The patient recently fell and presented to her primary care provider with RLE swelling and hip pain. Doppler of the RLE showed an extensive clot of the saphenofemoral, femoral, popliteal, posterior tibial and peroneal veins.  Further evaluation included a CT scan of the abdomen/pelvis that was remarkable for a right adnexal mass, peritoneal nodules, retroperitoneal adenopathy and moderate ascites.  The patient at one point became hypotensive; a CT angiogram was negative for a PE.  There was no obvious pleural effusions or pulmonary metastases. She underwent a diagnostic paracentesis by IR earlier today. She endorses anorexia, reflux symptoms, increasing abdominal girth and a 10 lb weight loss over the last few months. She denies any changes in her bladder habits.  There is a family history of breast and colon cancer.   Past Medical History:  Diagnosis Date  . Depression   . Diabetes mellitus without complication ([place])   . Hypertension   . Stroke [facility])     Past Surgical History:  Procedure Laterality Date  . HERNIA REPAIR    . IR PARACENTESIS  11/11/2016  . UMBILICAL HERNIA REPAIR      Family History  Problem Relation Age of Onset  . Parkinson's disease Mother   . Dementia Father   . Cancer Sister 53       Breast cancer    Social History:  reports that she has never smoked. She has never used smokeless tobacco. She reports that she does not drink alcohol or use drugs.  Allergies:  Allergies  Allergen Reactions  . Penicillins Nausea Only    Has patient had a PCN reaction causing immediate rash, facial/tongue/throat swelling, SOB or lightheadedness with hypotension: NO Has patient had a PCN reaction causing severe rash involving mucus membranes or skin necrosis: NO Has patient  had a PCN reaction that required hospitalization: NO Has patient had a PCN reaction occurring within the last 10 years: NO If all of the above answers are "NO", then may proceed with Cephalosporin use.     Medications: I have reviewed the patient's current medications.  Results for orders placed or performed during the hospital encounter of 11/10/16 (from the past 48 hour(s))  CBC with Differential/Platelet     Status: Abnormal   Collection Time: 11/10/16  4:56 PM  Result Value Ref Range   WBC 8.1 4.0 - 10.5 K/uL   RBC 4.19 3.87 - 5.11 MIL/uL   Hemoglobin 12.4 12.0 - 15.0 g/dL   HCT 37.4 36.0 - 46.0 %   MCV 89.3 78.0 - 100.0 fL   MCH 29.6 26.0 - 34.0 pg   MCHC 33.2 30.0 - 36.0 g/dL   RDW 13.7 11.5 - 15.5 %   Platelets 345 150 - 400 K/uL   Neutrophils Relative % 85 %   Neutro Abs 6.8 1.7 - 7.7 K/uL   Lymphocytes Relative 7 %   Lymphs Abs 0.6 (L) 0.7 - 4.0 K/uL   Monocytes Relative 7 %   Monocytes Absolute 0.6 0.1 - 1.0 K/uL   Eosinophils Relative 1 %   Eosinophils Absolute 0.1 0.0 - 0.7 K/uL   Basophils Relative 0 %   Basophils Absolute 0.0 0.0 - 0.1 K/uL  Comprehensive metabolic panel     Status: Abnormal   Collection Time: 11/10/16  4:56 PM  Result Value Ref Range   Sodium 135 135 - 145 mmol/L   Potassium 4.1 3.5 - 5.1 mmol/L   Chloride 102 101 - 111 mmol/L   CO2 23 22 - 32 mmol/L   Glucose, Bld 150 (H) 65 - 99 mg/dL   BUN 33 (H) 6 - 20 mg/dL   Creatinine, Ser 1.15 (H) 0.44 - 1.00 mg/dL   Calcium 8.8 (L) 8.9 - 10.3 mg/dL   Total Protein 6.5 6.5 - 8.1 g/dL   Albumin 3.0 (L) 3.5 - 5.0 g/dL   AST 24 15 - 41 U/L   ALT 12 (L) 14 - 54 U/L   Alkaline Phosphatase 123 38 - 126 U/L   Total Bilirubin 0.8 0.3 - 1.2 mg/dL   GFR calc non Af Amer 46 (L) >60 mL/min   GFR calc Af Amer 54 (L) >60 mL/min    Comment: (NOTE) The eGFR has been calculated using the CKD EPI equation. This calculation has not been validated in all clinical situations. eGFR's persistently <60 mL/min  signify possible Chronic Kidney Disease.    Anion gap 10 5 - 15  Lipase, blood     Status: None   Collection Time: 11/10/16  4:56 PM  Result Value Ref Range   Lipase 24 11 - 51 U/L  I-stat troponin, ED     Status: None   Collection Time: 11/10/16  5:00 PM  Result Value Ref Range   Troponin i, poc 0.02 0.00 - 0.08 ng/mL   Comment 3            Comment: Due to the release kinetics of cTnI, a negative result within the first hours of the onset of symptoms does not rule out myocardial infarction with certainty. If myocardial infarction is still suspected, repeat the test at appropriate intervals.   Glucose, capillary     Status: Abnormal   Collection Time: 11/11/16  1:08 AM  Result Value Ref Range   Glucose-Capillary 121 (H) 65 - 99 mg/dL  Heparin level (unfractionated)     Status: Abnormal   Collection Time: 11/11/16  1:30 AM  Result Value Ref Range   Heparin Unfractionated 0.13 (L) 0.30 - 0.70 IU/mL    Comment:        IF HEPARIN RESULTS ARE BELOW EXPECTED VALUES, AND PATIENT DOSAGE HAS BEEN CONFIRMED, SUGGEST FOLLOW UP TESTING OF ANTITHROMBIN III LEVELS.   CBC     Status: None   Collection Time: 11/11/16  1:30 AM  Result Value Ref Range   WBC 7.5 4.0 - 10.5 K/uL   RBC 4.45 3.87 - 5.11 MIL/uL   Hemoglobin 13.2 12.0 - 15.0 g/dL   HCT 40.4 36.0 - 46.0 %   MCV 90.8 78.0 - 100.0 fL   MCH 29.7 26.0 - 34.0 pg   MCHC 32.7 30.0 - 36.0 g/dL   RDW 13.7 11.5 - 15.5 %   Platelets 298 150 - 400 K/uL  Basic metabolic panel     Status: Abnormal   Collection Time: 11/11/16  1:30 AM  Result Value Ref Range   Sodium 134 (L) 135 - 145 mmol/L   Potassium 4.6 3.5 - 5.1 mmol/L   Chloride 101 101 - 111 mmol/L   CO2 24 22 - 32 mmol/L   Glucose, Bld 129 (H) 65 - 99 mg/dL   BUN 28 (H) 6 - 20 mg/dL   Creatinine, Ser 1.12 (H) 0.44 - 1.00 mg/dL   Calcium 8.8 (L) 8.9 - 10.3 mg/dL   GFR calc non  Af Amer 48 (L) >60 mL/min   GFR calc Af Amer 55 (L) >60 mL/min    Comment: (NOTE) The eGFR has  been calculated using the CKD EPI equation. This calculation has not been validated in all clinical situations. eGFR's persistently <60 mL/min signify possible Chronic Kidney Disease.    Anion gap 9 5 - 15  Glucose, capillary     Status: Abnormal   Collection Time: 11/11/16  6:40 AM  Result Value Ref Range   Glucose-Capillary 160 (H) 65 - 99 mg/dL  Glucose, capillary     Status: Abnormal   Collection Time: 11/11/16 11:48 AM  Result Value Ref Range   Glucose-Capillary 135 (H) 65 - 99 mg/dL   Comment 1 Notify RN   Heparin level (unfractionated)     Status: Abnormal   Collection Time: 11/11/16 12:31 PM  Result Value Ref Range   Heparin Unfractionated 0.20 (L) 0.30 - 0.70 IU/mL    Comment:        IF HEPARIN RESULTS ARE BELOW EXPECTED VALUES, AND PATIENT DOSAGE HAS BEEN CONFIRMED, SUGGEST FOLLOW UP TESTING OF ANTITHROMBIN III LEVELS.   Protime-INR     Status: None   Collection Time: 11/11/16 12:31 PM  Result Value Ref Range   Prothrombin Time 12.8 11.4 - 15.2 seconds   INR 0.96   Glucose, capillary     Status: None   Collection Time: 11/11/16  4:31 PM  Result Value Ref Range   Glucose-Capillary 95 65 - 99 mg/dL   Comment 1 Notify RN   Lactate dehydrogenase (pleural or peritoneal fluid)     Status: Abnormal   Collection Time: 11/11/16  5:08 PM  Result Value Ref Range   LD, Fluid 298 (H) 3 - 23 U/L    Comment: (NOTE) Results should be evaluated in conjunction with serum values    Fluid Type-FLDH PERITONEAL     Comment: FLUID CORRECTED ON 06/22 AT 1709: PREVIOUSLY REPORTED AS Peritoneal     Dg Chest 2 View  Result Date: 11/10/2016 CLINICAL DATA:  Chest pain and right lower extremity DVT EXAM: CHEST  2 VIEW COMPARISON:  Chest radiograph 08/30/2015 FINDINGS: Shallow lung inflation without focal airspace disease or pulmonary edema. No pleural effusion or pneumothorax. Unchanged cardiomediastinal contours. IMPRESSION: No active cardiopulmonary disease. Electronically Signed    By: Ulyses Jarred M.D.   On: 11/10/2016 18:25   Ct Angio Chest Pe W And/or Wo Contrast  Result Date: 11/10/2016 CLINICAL DATA:  Shortness of breath, abdominal pain, right lower extremity DVT EXAM: CT ANGIOGRAPHY CHEST CT ABDOMEN AND PELVIS WITH CONTRAST TECHNIQUE: Multidetector CT imaging of the chest was performed using the standard protocol during bolus administration of intravenous contrast. Multiplanar CT image reconstructions and MIPs were obtained to evaluate the vascular anatomy. Multidetector CT imaging of the abdomen and pelvis was performed using the standard protocol during bolus administration of intravenous contrast. CONTRAST:  100 mL Isovue 370 IV COMPARISON:  Chest radiograph 12/24/2003 and 11/10/2016 FINDINGS: CTA CHEST FINDINGS Cardiovascular: Contrast injection is sufficient to demonstrate satisfactory opacification of the pulmonary arteries to the segmental level. There is no pulmonary embolus. The main pulmonary artery is within normal limits for size. There is no CT evidence of acute right heart strain. There is calcific aortic atherosclerosis. There are multifocal coronary artery calcifications. There is a normal 3-vessel arch branching pattern. Heart size is normal, without pericardial effusion. Mediastinum/Nodes: No mediastinal, hilar or axillary lymphadenopathy. The visualized thyroid and thoracic esophageal course are unremarkable. Lungs/Pleura: No pulmonary nodules  or masses. No pleural effusion or pneumothorax. No focal airspace consolidation. No focal pleural abnormality. Musculoskeletal: No chest wall abnormality. No acute or significant osseous findings. Review of the MIP images confirms the above findings. CT ABDOMEN and PELVIS FINDINGS Hepatobiliary: Moderate volume perihepatic ascites. No focal liver lesion. The gallbladder is filled with stones. Pancreas: Normal pancreatic contours and enhancement. No peripancreatic fluid collection or pancreatic ductal dilatation. Spleen:  Medium volume perisplenic ascites. There is peritoneal nodularity particularly in the left upper quadrant and lower midline abdomen. Adrenals/Urinary Tract: Normal adrenal glands. No hydronephrosis or solid renal mass. Stomach/Bowel: There is no hiatal hernia. The stomach and duodenum are normal. There is no dilated small bowel or enteric inflammation. There is no colonic abnormality. Nonvisualization of the appendix. Vascular/Lymphatic: There is atherosclerotic calcification of the non aneurysmal abdominal aorta. Aortocaval lymph nodes measure up to 8 mm. Reproductive: There is a multilobulated mass of the right pelvis. It is unclear whether it arises from the uterine fundus to the right adnexa. There is a left ovarian cyst measuring 4 cm. Musculoskeletal: No lytic or blastic osseous lesion. Normal visualized extrathoracic and extraperitoneal soft tissues. Other: No contributory non-categorized findings. IMPRESSION: 1. No pulmonary embolus. 2. Extensive anterior peritoneal nodular implants, consistent with carcinomatosis. Perihepatic and perisplenic ascites is likely malignant. 3. Lobulated right pelvic mass arising from either the right adnexa or the uterine fundus. This may be the primary malignancy. 4. Gallbladder filled stones but no other evidence of acute cholecystitis. 5. Multiple subcentimeter retroperitoneal nodes. In this context, these could indicate lymphatic spread of disease. Electronically Signed   By: Ulyses Jarred M.D.   On: 11/10/2016 19:29   Ct Abdomen Pelvis W Contrast  Result Date: 11/10/2016 CLINICAL DATA:  Shortness of breath, abdominal pain, right lower extremity DVT EXAM: CT ANGIOGRAPHY CHEST CT ABDOMEN AND PELVIS WITH CONTRAST TECHNIQUE: Multidetector CT imaging of the chest was performed using the standard protocol during bolus administration of intravenous contrast. Multiplanar CT image reconstructions and MIPs were obtained to evaluate the vascular anatomy. Multidetector CT imaging  of the abdomen and pelvis was performed using the standard protocol during bolus administration of intravenous contrast. CONTRAST:  100 mL Isovue 370 IV COMPARISON:  Chest radiograph 12/24/2003 and 11/10/2016 FINDINGS: CTA CHEST FINDINGS Cardiovascular: Contrast injection is sufficient to demonstrate satisfactory opacification of the pulmonary arteries to the segmental level. There is no pulmonary embolus. The main pulmonary artery is within normal limits for size. There is no CT evidence of acute right heart strain. There is calcific aortic atherosclerosis. There are multifocal coronary artery calcifications. There is a normal 3-vessel arch branching pattern. Heart size is normal, without pericardial effusion. Mediastinum/Nodes: No mediastinal, hilar or axillary lymphadenopathy. The visualized thyroid and thoracic esophageal course are unremarkable. Lungs/Pleura: No pulmonary nodules or masses. No pleural effusion or pneumothorax. No focal airspace consolidation. No focal pleural abnormality. Musculoskeletal: No chest wall abnormality. No acute or significant osseous findings. Review of the MIP images confirms the above findings. CT ABDOMEN and PELVIS FINDINGS Hepatobiliary: Moderate volume perihepatic ascites. No focal liver lesion. The gallbladder is filled with stones. Pancreas: Normal pancreatic contours and enhancement. No peripancreatic fluid collection or pancreatic ductal dilatation. Spleen: Medium volume perisplenic ascites. There is peritoneal nodularity particularly in the left upper quadrant and lower midline abdomen. Adrenals/Urinary Tract: Normal adrenal glands. No hydronephrosis or solid renal mass. Stomach/Bowel: There is no hiatal hernia. The stomach and duodenum are normal. There is no dilated small bowel or enteric inflammation. There is no colonic abnormality.  Nonvisualization of the appendix. Vascular/Lymphatic: There is atherosclerotic calcification of the non aneurysmal abdominal aorta.  Aortocaval lymph nodes measure up to 8 mm. Reproductive: There is a multilobulated mass of the right pelvis. It is unclear whether it arises from the uterine fundus to the right adnexa. There is a left ovarian cyst measuring 4 cm. Musculoskeletal: No lytic or blastic osseous lesion. Normal visualized extrathoracic and extraperitoneal soft tissues. Other: No contributory non-categorized findings. IMPRESSION: 1. No pulmonary embolus. 2. Extensive anterior peritoneal nodular implants, consistent with carcinomatosis. Perihepatic and perisplenic ascites is likely malignant. 3. Lobulated right pelvic mass arising from either the right adnexa or the uterine fundus. This may be the primary malignancy. 4. Gallbladder filled stones but no other evidence of acute cholecystitis. 5. Multiple subcentimeter retroperitoneal nodes. In this context, these could indicate lymphatic spread of disease. Electronically Signed   By: Ulyses Jarred M.D.   On: 11/10/2016 19:29   Ir Paracentesis  Result Date: 11/11/2016 INDICATION: Probable carcinomatosis ascites EXAM: ULTRASOUND-GUIDED PARACENTESIS COMPARISON:  None. MEDICATIONS: 10 cc 1% lidocaine. COMPLICATIONS: None immediate. TECHNIQUE: Informed written consent was obtained from the patient after a discussion of the risks, benefits and alternatives to treatment. A timeout was performed prior to the initiation of the procedure. Initial ultrasound scanning demonstrates a small amount of ascites within the right upper abdominal quadrant. The right upper abdomen was prepped and draped in the usual sterile fashion. 1% lidocaine with epinephrine was used for local anesthesia. Under direct ultrasound guidance, a 19 gauge, 7-cm, Yueh catheter was introduced. An ultrasound image was saved for documentation purposed. The paracentesis was performed. The catheter was removed and a dressing was applied. The patient tolerated the procedure well without immediate post procedural complication.  FINDINGS: A total of approximately 420 cc of dark yellow fluid was removed. Samples were sent to the laboratory as requested by the clinical team. IMPRESSION: Successful ultrasound-guided paracentesis yielding 420 cc of peritoneal fluid. Read by Lavonia Drafts [facility] Electronically Signed   By: Jacqulynn Cadet M.D.   On: 11/11/2016 15:52    Review of Systems  Constitutional: Positive for weight loss.  Gastrointestinal: Positive for heartburn. Negative for abdominal pain, constipation, diarrhea, nausea and vomiting.  Genitourinary: Negative for dysuria, frequency, hematuria and urgency.   Blood pressure 130/77, pulse 60, temperature 97.6 F (36.4 C), temperature source Oral, resp. rate 16, height 5' 1.5" (1.562 m), weight 161 lb (73 kg), SpO2 100 %. Physical Exam  Constitutional: She is oriented to person, place, and time. She appears well-developed.  Appears older than stated age  GI: Soft. She exhibits no distension. There is no tenderness.  Genitourinary:  Genitourinary Comments: Deferred  Musculoskeletal: She exhibits edema (R>>>L).  Neurological: She is alert and oriented to person, place, and time.  Skin: Skin is warm. There is pallor.  Psychiatric: She has a normal mood and affect.    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  >review the cytology from the ascitic fluid, tumor markers >continue stabilization of the patient from a VTE standpoint >treatment/managment options include neoadjuvant chemotherapy followed by interval cytoreduction (preferred) vs primary cytoreduction after placement of an IVC filter > if she is not discharged over the weekend, will see the patient in house on Monday; if she is discharged over the weekend, will see her in follow-up in the [place] office at the [facility] next week--we will contact the patient to schedule the follow-up appointment  JACKSON-MOORE,Caio Devera A 11/11/2016, 6:48 PM

## 2016-11-11 NOTE — Progress Notes (Signed)
Patient ID: Annette Farmer, female   DOB: March 15, 1946, 71 y.o.   MRN: [id number]   US Paracentesis was performed today 420 cc dark yellow fluid obtained Await cytology If neg-- may need Out Patient biopsy  Will cancel order for bx now Can re order if needed----

## 2016-11-11 NOTE — Progress Notes (Addendum)
Benefit check sent for DOACs  280.00 deduc has not been met and due to this the Eliquis 2.0 mg for 30 days will be 319.85 they do not cover 67m and the Xarelto 211mand 1571mill be 319.85

## 2016-11-11 NOTE — Progress Notes (Signed)
PROGRESS NOTE    Annette Farmer  DZH:[id number] DOB: 1945-08-06 DOA: 11/10/2016 PCP: Wardell Honour, MD    Brief Narrative:Annette Farmer is a 71 y.o. female with medical history significant of HTN, DM type II, CVA, and depression;  who presents with complaints of right leg swelling over the last 1 week Doppler ultrasound of the bilateral lower extremities revealed a extensive clot of the right saphenofemoral junction, common femoral, profunda, femoral, popliteal, posterior tibial, and peroneal veins. Discussed with her regarding thrombolytics tfor the extensive DVT. I am not sure how much she understood it, for now she would like to be on the heparin. Asked if I can talk to her family about it, she does not have any children, there is a 43 year old brother in [place] who is taking care of their elderly father and a brother in law. So she doesn't want me to talk to them yet.   Meanwhile CT abd and chest shows right adnexal tumour with carcinomatosis.  Consulted gyn oncologist recommended tumour markers and an omental biopsy which is scheduled to be done by IR if they can.  US paracentesis ordered for diagnostic purposes.   Assessment & Plan:   Principal Problem:   DVT (deep venous thrombosis) (HCC) Active Problems:   Type 2 diabetes mellitus (HCC)   HYPERTENSION, BENIGN SYSTEMIC   AKI (acute kidney injury) ([place])   Pelvic mass   Carcinomatosis (HCC)    Extensive DVT ON THE RIGHT :  IV heparin, started probably coagulopathic from ovarian cancer.  No PE on CT angiogram.    Right pelvis mass possibly from the right adnexa:  Omental biopsy and Gyn Onc Consultation request.     AKI:  Suspect from dehydration/ pre renal azotemia.  Get urine electrolytes.  Hydrate and repeat renal parameters in am.  Holding nephrotoxic drugs.    Hypertension; Stop HCTZ.  Controlled.   Diabetes mellitus:  CBG (last 3)   Recent Labs  11/11/16 0108 11/11/16 0640 11/11/16 1148  GLUCAP  121* 160* 135*    Resume SSI.      DVT prophylaxis: heparin.  Code Status: full code.  Family Communication: none at bedside.  Disposition Plan: pending further work up.    Consultants:   IR  GYN ONCOLOGY Dr Delsa Sale.   Procedures:   Paracentesis ordered for diagnostic purpose.  IR consult for omental biopsy possibly on Monday.    Antimicrobials: none.    Subjective: Reports some abdominal discomfort.  No chest pain , sob.  Nausea present.  No vomiting.    Objective: Vitals:   11/11/16 0058 11/11/16 0512 11/11/16 1315 11/11/16 1500  BP:  117/66 (!) 152/77 (!) 129/91  Pulse:  66 73   Resp:  18 18   Temp:  98.1 F (36.7 C) 97.6 F (36.4 C)   TempSrc:  Oral Oral   SpO2:  97% 98%   Weight: 73 kg (161 lb)     Height: 5' 1.5" (1.562 m)       Intake/Output Summary (Last 24 hours) at 11/11/16 1514 Last data filed at 11/11/16 0745  Gross per 24 hour  Intake           292.35 ml  Output                0 ml  Net           292.35 ml   Filed Weights   11/11/16 0058  Weight: 73 kg (161 lb)  Examination:  General exam: Appears calm and comfortable  Respiratory system: Clear to auscultation. Respiratory effort normal. Cardiovascular system: S1 & S2 heard,RRR, pedal edema.  Gastrointestinal system: abdomen was soft, distended, mild abdominal discomfort, bowel sounds heard.  Central nervous system: Alert and oriented. No focal neurological deficits. Extremities: bilateral pedal edema.  Skin: No rashes, lesions or ulcers Psychiatry:  Mood & affect appropriate.     Data Reviewed: I have personally reviewed following labs and imaging studies  CBC:  Recent Labs Lab 11/10/16 1656 11/11/16 0130  WBC 8.1 7.5  NEUTROABS 6.8  --   HGB 12.4 13.2  HCT 37.4 40.4  MCV 89.3 90.8  PLT 345 237   Basic Metabolic Panel:  Recent Labs Lab 11/10/16 1656 11/11/16 0130  NA 135 134*  K 4.1 4.6  CL 102 101  CO2 23 24  GLUCOSE 150* 129*  BUN 33* 28*    CREATININE 1.15* 1.12*  CALCIUM 8.8* 8.8*   GFR: Estimated Creatinine Clearance: 42 mL/min (A) (by C-G formula based on SCr of 1.12 mg/dL (H)). Liver Function Tests:  Recent Labs Lab 11/10/16 1656  AST 24  ALT 12*  ALKPHOS 123  BILITOT 0.8  PROT 6.5  ALBUMIN 3.0*    Recent Labs Lab 11/10/16 1656  LIPASE 24   No results for input(s): AMMONIA in the last 168 hours. Coagulation Profile:  Recent Labs Lab 11/11/16 1231  INR 0.96   Cardiac Enzymes: No results for input(s): CKTOTAL, CKMB, CKMBINDEX, TROPONINI in the last 168 hours. BNP (last 3 results) No results for input(s): PROBNP in the last 8760 hours. HbA1C: No results for input(s): HGBA1C in the last 72 hours. CBG:  Recent Labs Lab 11/11/16 0108 11/11/16 0640 11/11/16 1148  GLUCAP 121* 160* 135*   Lipid Profile: No results for input(s): CHOL, HDL, LDLCALC, TRIG, CHOLHDL, LDLDIRECT in the last 72 hours. Thyroid Function Tests: No results for input(s): TSH, T4TOTAL, FREET4, T3FREE, THYROIDAB in the last 72 hours. Anemia Panel: No results for input(s): VITAMINB12, FOLATE, FERRITIN, TIBC, IRON, RETICCTPCT in the last 72 hours. Sepsis Labs: No results for input(s): PROCALCITON, LATICACIDVEN in the last 168 hours.  No results found for this or any previous visit (from the past 240 hour(s)).       Radiology Studies: Dg Chest 2 View  Result Date: 11/10/2016 CLINICAL DATA:  Chest pain and right lower extremity DVT EXAM: CHEST  2 VIEW COMPARISON:  Chest radiograph 08/30/2015 FINDINGS: Shallow lung inflation without focal airspace disease or pulmonary edema. No pleural effusion or pneumothorax. Unchanged cardiomediastinal contours. IMPRESSION: No active cardiopulmonary disease. Electronically Signed   By: Ulyses Jarred M.D.   On: 11/10/2016 18:25   Ct Angio Chest Pe W And/or Wo Contrast  Result Date: 11/10/2016 CLINICAL DATA:  Shortness of breath, abdominal pain, right lower extremity DVT EXAM: CT  ANGIOGRAPHY CHEST CT ABDOMEN AND PELVIS WITH CONTRAST TECHNIQUE: Multidetector CT imaging of the chest was performed using the standard protocol during bolus administration of intravenous contrast. Multiplanar CT image reconstructions and MIPs were obtained to evaluate the vascular anatomy. Multidetector CT imaging of the abdomen and pelvis was performed using the standard protocol during bolus administration of intravenous contrast. CONTRAST:  100 mL Isovue 370 IV COMPARISON:  Chest radiograph 12/24/2003 and 11/10/2016 FINDINGS: CTA CHEST FINDINGS Cardiovascular: Contrast injection is sufficient to demonstrate satisfactory opacification of the pulmonary arteries to the segmental level. There is no pulmonary embolus. The main pulmonary artery is within normal limits for size. There is no CT  evidence of acute right heart strain. There is calcific aortic atherosclerosis. There are multifocal coronary artery calcifications. There is a normal 3-vessel arch branching pattern. Heart size is normal, without pericardial effusion. Mediastinum/Nodes: No mediastinal, hilar or axillary lymphadenopathy. The visualized thyroid and thoracic esophageal course are unremarkable. Lungs/Pleura: No pulmonary nodules or masses. No pleural effusion or pneumothorax. No focal airspace consolidation. No focal pleural abnormality. Musculoskeletal: No chest wall abnormality. No acute or significant osseous findings. Review of the MIP images confirms the above findings. CT ABDOMEN and PELVIS FINDINGS Hepatobiliary: Moderate volume perihepatic ascites. No focal liver lesion. The gallbladder is filled with stones. Pancreas: Normal pancreatic contours and enhancement. No peripancreatic fluid collection or pancreatic ductal dilatation. Spleen: Medium volume perisplenic ascites. There is peritoneal nodularity particularly in the left upper quadrant and lower midline abdomen. Adrenals/Urinary Tract: Normal adrenal glands. No hydronephrosis or solid  renal mass. Stomach/Bowel: There is no hiatal hernia. The stomach and duodenum are normal. There is no dilated small bowel or enteric inflammation. There is no colonic abnormality. Nonvisualization of the appendix. Vascular/Lymphatic: There is atherosclerotic calcification of the non aneurysmal abdominal aorta. Aortocaval lymph nodes measure up to 8 mm. Reproductive: There is a multilobulated mass of the right pelvis. It is unclear whether it arises from the uterine fundus to the right adnexa. There is a left ovarian cyst measuring 4 cm. Musculoskeletal: No lytic or blastic osseous lesion. Normal visualized extrathoracic and extraperitoneal soft tissues. Other: No contributory non-categorized findings. IMPRESSION: 1. No pulmonary embolus. 2. Extensive anterior peritoneal nodular implants, consistent with carcinomatosis. Perihepatic and perisplenic ascites is likely malignant. 3. Lobulated right pelvic mass arising from either the right adnexa or the uterine fundus. This may be the primary malignancy. 4. Gallbladder filled stones but no other evidence of acute cholecystitis. 5. Multiple subcentimeter retroperitoneal nodes. In this context, these could indicate lymphatic spread of disease. Electronically Signed   By: Ulyses Jarred M.D.   On: 11/10/2016 19:29   Ct Abdomen Pelvis W Contrast  Result Date: 11/10/2016 CLINICAL DATA:  Shortness of breath, abdominal pain, right lower extremity DVT EXAM: CT ANGIOGRAPHY CHEST CT ABDOMEN AND PELVIS WITH CONTRAST TECHNIQUE: Multidetector CT imaging of the chest was performed using the standard protocol during bolus administration of intravenous contrast. Multiplanar CT image reconstructions and MIPs were obtained to evaluate the vascular anatomy. Multidetector CT imaging of the abdomen and pelvis was performed using the standard protocol during bolus administration of intravenous contrast. CONTRAST:  100 mL Isovue 370 IV COMPARISON:  Chest radiograph 12/24/2003 and  11/10/2016 FINDINGS: CTA CHEST FINDINGS Cardiovascular: Contrast injection is sufficient to demonstrate satisfactory opacification of the pulmonary arteries to the segmental level. There is no pulmonary embolus. The main pulmonary artery is within normal limits for size. There is no CT evidence of acute right heart strain. There is calcific aortic atherosclerosis. There are multifocal coronary artery calcifications. There is a normal 3-vessel arch branching pattern. Heart size is normal, without pericardial effusion. Mediastinum/Nodes: No mediastinal, hilar or axillary lymphadenopathy. The visualized thyroid and thoracic esophageal course are unremarkable. Lungs/Pleura: No pulmonary nodules or masses. No pleural effusion or pneumothorax. No focal airspace consolidation. No focal pleural abnormality. Musculoskeletal: No chest wall abnormality. No acute or significant osseous findings. Review of the MIP images confirms the above findings. CT ABDOMEN and PELVIS FINDINGS Hepatobiliary: Moderate volume perihepatic ascites. No focal liver lesion. The gallbladder is filled with stones. Pancreas: Normal pancreatic contours and enhancement. No peripancreatic fluid collection or pancreatic ductal dilatation. Spleen: Medium volume perisplenic  ascites. There is peritoneal nodularity particularly in the left upper quadrant and lower midline abdomen. Adrenals/Urinary Tract: Normal adrenal glands. No hydronephrosis or solid renal mass. Stomach/Bowel: There is no hiatal hernia. The stomach and duodenum are normal. There is no dilated small bowel or enteric inflammation. There is no colonic abnormality. Nonvisualization of the appendix. Vascular/Lymphatic: There is atherosclerotic calcification of the non aneurysmal abdominal aorta. Aortocaval lymph nodes measure up to 8 mm. Reproductive: There is a multilobulated mass of the right pelvis. It is unclear whether it arises from the uterine fundus to the right adnexa. There is a left  ovarian cyst measuring 4 cm. Musculoskeletal: No lytic or blastic osseous lesion. Normal visualized extrathoracic and extraperitoneal soft tissues. Other: No contributory non-categorized findings. IMPRESSION: 1. No pulmonary embolus. 2. Extensive anterior peritoneal nodular implants, consistent with carcinomatosis. Perihepatic and perisplenic ascites is likely malignant. 3. Lobulated right pelvic mass arising from either the right adnexa or the uterine fundus. This may be the primary malignancy. 4. Gallbladder filled stones but no other evidence of acute cholecystitis. 5. Multiple subcentimeter retroperitoneal nodes. In this context, these could indicate lymphatic spread of disease. Electronically Signed   By: Ulyses Jarred M.D.   On: 11/10/2016 19:29        Scheduled Meds: . aspirin  325 mg Oral Daily  . gabapentin  300 mg Oral QHS  . insulin aspart  0-5 Units Subcutaneous QHS  . insulin aspart  0-9 Units Subcutaneous TID WC  . lidocaine (PF)      . losartan  50 mg Oral Daily  . metoprolol succinate  25 mg Oral Daily  . nystatin   Topical TID   Continuous Infusions: . sodium chloride 75 mL/hr at 11/11/16 1335  . heparin 1,300 Units/hr (11/11/16 1443)     LOS: 1 day    Time spent: 35 minutes.     Hosie Poisson, MD [facility] Pager [phone]  If 7PM-7AM, please contact night-coverage [email or web address] Password [email or web address] 11/11/2016, 3:14 PM

## 2016-11-11 NOTE — Progress Notes (Signed)
ANTICOAGULATION CONSULT NOTE - Follow Up Consult  Pharmacy Consult for Heparin  Indication: DVT  Patient Measurements: Height: 5' 1.5" (156.2 cm) Weight: 161 lb (73 kg) IBW/kg (Calculated) : 48.95  Heparin dosing wt: 64.7 kg  Vital Signs: Temp: 98.1 F (36.7 C) (06/22 2034) Temp Source: Oral (06/22 2034) BP: 114/68 (06/22 2034) Pulse Rate: 63 (06/22 2034)  Labs:  Recent Labs  11/10/16 1656 11/11/16 0130 11/11/16 1231 11/11/16 2111  HGB 12.4 13.2  --   --   HCT 37.4 40.4  --   --   PLT 345 298  --   --   LABPROT  --   --  12.8  --   INR  --   --  0.96  --   HEPARINUNFRC  --  0.13* 0.20* 0.33  CREATININE 1.15* 1.12*  --   --    Estimated Creatinine Clearance: 42 mL/min (A) (by C-G formula based on SCr of 1.12 mg/dL (H)).  Assessment: 71 y/o F with new-onset DVT. Not on anticoagulation PTA. CBC wnl.  Heparin level therapeutic after most recent rate increase to 1300 units/hr.   Goal of Therapy:  Heparin level 0.3-0.7 units/ml Monitor platelets by anticoagulation protocol: Yes   Plan:  1. Continue heparin infusion at 1300 units/hr  2. Confirmatory heparin level with am labs    Vincenza Hews, PharmD, BCPS 11/11/2016, 10:10 PM

## 2016-11-12 DIAGNOSIS — I82491 Acute embolism and thrombosis of other specified deep vein of right lower extremity: Secondary | ICD-10-CM

## 2016-11-12 DIAGNOSIS — R1900 Intra-abdominal and pelvic swelling, mass and lump, unspecified site: Secondary | ICD-10-CM

## 2016-11-12 LAB — GRAM STAIN

## 2016-11-12 LAB — BASIC METABOLIC PANEL
Anion gap: 7 (ref 5–15)
BUN: 15 mg/dL (ref 6–20)
CO2: 22 mmol/L (ref 22–32)
Calcium: 8 mg/dL — ABNORMAL LOW (ref 8.9–10.3)
Chloride: 106 mmol/L (ref 101–111)
Creatinine, Ser: 0.89 mg/dL (ref 0.44–1.00)
GFR calc Af Amer: >60 mL/min (ref >60)
GLUCOSE: 175 mg/dL — AB (ref 65–99)
POTASSIUM: 3.9 mmol/L (ref 3.5–5.1)
Sodium: 135 mmol/L (ref 135–145)

## 2016-11-12 LAB — CBC
HEMATOCRIT: 37.9 % (ref 36.0–46.0)
HEMOGLOBIN: 11.9 g/dL — AB (ref 12.0–15.0)
MCH: 28.5 pg (ref 26.0–34.0)
MCHC: 31.4 g/dL (ref 30.0–36.0)
MCV: 90.9 fL (ref 78.0–100.0)
Platelets: 301 10*3/uL (ref 150–400)
RBC: 4.17 MIL/uL (ref 3.87–5.11)
RDW: 13.7 % (ref 11.5–15.5)
WBC: 6.9 10*3/uL (ref 4.0–10.5)

## 2016-11-12 LAB — GLUCOSE, CAPILLARY
GLUCOSE-CAPILLARY: 141 mg/dL — AB (ref 65–99)
GLUCOSE-CAPILLARY: 141 mg/dL — AB (ref 65–99)
Glucose-Capillary: 105 mg/dL — ABNORMAL HIGH (ref 65–99)
Glucose-Capillary: 111 mg/dL — ABNORMAL HIGH (ref 65–99)

## 2016-11-12 LAB — CA 125: CA 125: 967.8 U/mL — ABNORMAL HIGH (ref 0.0–38.1)

## 2016-11-12 LAB — CANCER ANTIGEN 19-9: CA 19-9: 71 U/mL — ABNORMAL HIGH (ref 0–35)

## 2016-11-12 LAB — CEA: CEA: 2.4 ng/mL (ref 0.0–4.7)

## 2016-11-12 LAB — HEPARIN LEVEL (UNFRACTIONATED): HEPARIN UNFRACTIONATED: 0.3 [IU]/mL (ref 0.30–0.70)

## 2016-11-12 NOTE — Evaluation (Signed)
Physical Therapy Evaluation Patient Details Name: Annette Farmer MRN: [id number] DOB: 25-Nov-1945 Today's Date: 11/12/2016   History of Present Illness  Pt is a 71 y.o. female with medical history significant of HTN, DM type II, CVA, and depression. She presented with complaints of right leg swelling over the last 1 week.  Ultrasound revealed extensive clot RLE. Abdominal CT revealed right adnexal tumour with carcinomatosis.   Clinical Impression  Pt admitted with above diagnosis. Pt currently with functional limitations due to the deficits listed below (see PT Problem List). On eval, pt required min assist bed mobility, min guard assist transfers, and min guard assist gait with RW 150 feet. Pt will benefit from skilled PT to increase their independence and safety with mobility to allow discharge to home alone. Pt encouraged to ambulate to/from bathroom with nursing assist.        Follow Up Recommendations Supervision - Intermittent (Pt declining any follow up PT services. )    Equipment Recommendations  None recommended by PT    Recommendations for Other Services       Precautions / Restrictions Precautions Precautions: Fall      Mobility  Bed Mobility Overal bed mobility: Needs Assistance Bed Mobility: Rolling;Supine to Sit;Sit to Supine Rolling: Modified independent (Device/Increase time)   Supine to sit: Min assist;HOB elevated Sit to supine: HOB elevated;Supervision   General bed mobility comments: assist to elevate trunk, +rail  Transfers Overall transfer level: Needs assistance Equipment used: Rolling walker (2 wheeled) Transfers: Sit to/from [facility] Sit to Stand: Min guard Stand pivot transfers: Min guard       General transfer comment: increased time to complete  Ambulation/Gait Ambulation/Gait assistance: Min guard Ambulation Distance (Feet): 150 Feet Assistive device: Rolling walker (2 wheeled) Gait Pattern/deviations: Drifts  right/left;Step-through pattern;Decreased stride length;Trunk flexed Gait velocity: mildly decreased Gait velocity interpretation: Below normal speed for age/gender General Gait Details: Pt reporting RW wheels not rolling well causing her to drift R/L. Pt fatigues quickly.  Stairs            Wheelchair Mobility    Modified Rankin (Stroke Patients Only)       Balance Overall balance assessment: Needs assistance Sitting-balance support: No upper extremity supported;Feet supported Sitting balance-Leahy Scale: Good     Standing balance support: Bilateral upper extremity supported;During functional activity Standing balance-Leahy Scale: Fair                               Pertinent Vitals/Pain Pain Assessment: 0-10 Pain Score: 2  Pain Location: RLE Pain Descriptors / Indicators: Sore Pain Intervention(s): Monitored during session    Home Living Family/patient expects to be discharged to:: Private residence Living Arrangements: Alone Available Help at Discharge: Friend(s);Available PRN/intermittently Type of Home: House Home Access: Stairs to enter Entrance Stairs-Rails: Psychiatric nurse of Steps: 4 Home Layout: One level Home Equipment: Walker - 2 wheels;Cane - single point;Shower seat      Prior Function Level of Independence: Independent with assistive device(s)         Comments: Pt used cane for ambulation. Still drives. Mainly eats out at restaurants or fast food.     Hand Dominance        Extremity/Trunk Assessment                Communication   Communication: No difficulties  Cognition Arousal/Alertness: Awake/alert Behavior During Therapy: WFL for tasks assessed/performed Overall Cognitive Status: Within Functional Limits  for tasks assessed                                        General Comments      Exercises     Assessment/Plan    PT Assessment Patient needs continued PT services   PT Problem List Decreased strength;Decreased mobility;Decreased activity tolerance;Decreased balance;Decreased knowledge of use of DME;Pain;Decreased safety awareness       PT Treatment Interventions DME instruction;Gait training;Stair training;Functional mobility training;Balance training;Therapeutic exercise;Therapeutic activities;Patient/family education    PT Goals (Current goals can be found in the Care Plan section)  Acute Rehab PT Goals Patient Stated Goal: home PT Goal Formulation: With patient Time For Goal Achievement: 11/26/16 Potential to Achieve Goals: Fair    Frequency Min 3X/week   Barriers to discharge Decreased caregiver support      Co-evaluation               AM-PAC PT "6 Clicks" Daily Activity  Outcome Measure Difficulty turning over in bed (including adjusting bedclothes, sheets and blankets)?: A Little Difficulty moving from lying on back to sitting on the side of the bed? : Total Difficulty sitting down on and standing up from a chair with arms (e.g., wheelchair, bedside commode, etc,.)?: A Little Help needed moving to and from a bed to chair (including a wheelchair)?: A Little Help needed walking in hospital room?: A Little Help needed climbing 3-5 steps with a railing? : A Lot 6 Click Score: 15    End of Session Equipment Utilized During Treatment: Gait belt Activity Tolerance: Patient limited by fatigue Patient left: in bed;with call bell/phone within reach Nurse Communication: Mobility status PT Visit Diagnosis: Unsteadiness on feet (R26.81);History of falling (Z91.81);Muscle weakness (generalized) (M62.81)    Time: 1425-1500 PT Time Calculation (min) (ACUTE ONLY): 35 min   Charges:   PT Evaluation $PT Eval Moderate Complexity: 1 Procedure PT Treatments $Therapeutic Activity: 8-22 mins   PT G Codes:        Lorrin Goodell, PT  Office # [phone] Pager [phone]   Lorriane Shire 11/12/2016, 3:18 PM

## 2016-11-12 NOTE — Consult Note (Signed)
Hospital Consult    Reason for Consult:  Extensive rle dvt Requesting Physician:  Karleen Hampshire MRN #:  [id number]  History of Present Illness: This is a 71 y.o. female with a few days of rle swelling found to have extensive rle dvt and subsequently pelvic mass. Her leg is feeling better but remains edematous and heavy. She does not have personal or family history of dvt. Denies abdominal pain at this time. No fevers or chills.   Past Medical History:  Diagnosis Date  . Depression   . Diabetes mellitus without complication ([place])   . Hypertension   . Stroke [facility])     Past Surgical History:  Procedure Laterality Date  . HERNIA REPAIR    . IR PARACENTESIS  11/11/2016  . UMBILICAL HERNIA REPAIR      Allergies  Allergen Reactions  . Penicillins Nausea Only    Has patient had a PCN reaction causing immediate rash, facial/tongue/throat swelling, SOB or lightheadedness with hypotension: NO Has patient had a PCN reaction causing severe rash involving mucus membranes or skin necrosis: NO Has patient had a PCN reaction that required hospitalization: NO Has patient had a PCN reaction occurring within the last 10 years: NO If all of the above answers are "NO", then may proceed with Cephalosporin use.     Prior to Admission medications   Medication Sig Start Date End Date Taking? Authorizing Provider  aspirin 325 MG tablet Take 325 mg by mouth daily.   Yes [provider]  COLLAGEN PO Take 6 tablets by mouth daily.   Yes [provider]  gabapentin (NEURONTIN) 300 MG capsule Take 1 capsule (300 mg total) by mouth at bedtime. 10/04/16  Yes Wardell Honour, MD  ibuprofen (ADVIL,MOTRIN) 800 MG tablet Take 1 tablet (800 mg total) by mouth every 8 (eight) hours as needed. Patient taking differently: Take 800 mg by mouth every 8 (eight) hours as needed for mild pain.  11/03/16  Yes Stallings, Zoe A, MD  KRILL OIL PO Take 1 tablet by mouth daily.   Yes [provider]    losartan-hydrochlorothiazide (HYZAAR) 50-12.5 MG tablet Take 1 tablet by mouth daily. 09/28/16  Yes Wardell Honour, MD  metFORMIN (GLUCOPHAGE) 500 MG tablet Take 1 tablet (500 mg total) by mouth daily with breakfast. 10/17/16  Yes Wardell Honour, MD  metoprolol succinate (TOPROL-XL) 25 MG 24 hr tablet Take 1 tablet (25 mg total) by mouth daily. 10/17/16  Yes Wardell Honour, MD  nystatin (MYCOSTATIN/NYSTOP) powder Apply topically 3 (three) times daily. TO RASH OF R GROIN 11/05/16   Wendie Agreste, MD    Social History   Social History  . Marital status: Single    Spouse name: N/A  . Number of children: N/A  . Years of education: N/A   Occupational History  . Not on file.   Social History Main Topics  . Smoking status: Never Smoker  . Smokeless tobacco: Never Used  . Alcohol use No  . Drug use: No  . Sexual activity: No   Other Topics Concern  . Not on file   Social History Narrative   Marital status: single; not dating      Children: none      Live: alone      Employment: retired in 1997; Corporate treasurer      Tobacco: never      Alcohol: drinks "a little" 3 bottles of liquor and 3 bottles of wine      Exercise:  none in 2018     Family History  Problem Relation Age of Onset  . Parkinson's disease Mother   . Dementia Father   . Cancer Sister 42       Breast cancer    REVIEW OF SYSTEMS (negative unless checked):   Cardiac:  []  Chest pain or chest pressure? []  Shortness of breath upon activity? []  Shortness of breath when lying flat? []  Irregular heart rhythm?  Vascular:  []  Pain in calf, thigh, or hip brought on by walking? []  Pain in feet at night that wakes you up from your sleep? [x]  Blood clot in your veins? [x]  Leg swelling?  Pulmonary:  []  Oxygen at home? []  Productive cough? []  Wheezing?  Neurologic:  []  Sudden weakness in arms or legs? []  Sudden numbness in arms or legs? []  Sudden onset of difficult speaking or slurred speech? []  Temporary  loss of vision in one eye? []  Problems with dizziness?  Gastrointestinal:  []  Blood in stool? []  Vomited blood?  Genitourinary:  []  Burning when urinating? []  Blood in urine?  Psychiatric:  []  Major depression  Hematologic:  []  Bleeding problems? []  Problems with blood clotting?  Dermatologic:  []  Rashes or ulcers?  Constitutional:  []  Fever or chills?  Ear/Nose/Throat:  []  Change in hearing? []  Nose bleeds? []  Sore throat?  Musculoskeletal:  []  Back pain? []  Joint pain? []  Muscle pain?   Physical Examination  Vitals:   11/11/16 2034 11/12/16 0438  BP: 114/68 (!) 106/57  Pulse: 63 77  Resp: 18 16  Temp: 98.1 F (36.7 C) 98.2 F (36.8 C)   Body mass index is 29.93 kg/m.  General:  WDWN in NAD Gait: Not observed HENT: WNL, normocephalic Pulmonary: normal non-labored breathing Cardiac:palpable pedal pulses bilaterally Abdomen:  Soft, she is nontender  Extremities: rle is larger than left, non-pitting, no evidence of varicosities or collaterals Musculoskeletal: no muscle wasting or atrophy  Neurologic: A&O X 3;; SENSATION: normal; MOTOR FUNCTION:  moving all extremities equally. Speech is fluent/normal Psychiatric:  Appropriate mood and affect  CBC    Component Value Date/Time   WBC 6.9 11/12/2016 0217   RBC 4.17 11/12/2016 0217   HGB 11.9 (L) 11/12/2016 0217   HGB 14.8 09/28/2016 1715   HCT 37.9 11/12/2016 0217   HCT 45.3 09/28/2016 1715   PLT 301 11/12/2016 0217   PLT 313 09/28/2016 1715   MCV 90.9 11/12/2016 0217   MCV 87.7 11/03/2016 1714   MCV 89 09/28/2016 1715   MCH 28.5 11/12/2016 0217   MCHC 31.4 11/12/2016 0217   RDW 13.7 11/12/2016 0217   RDW 13.6 09/28/2016 1715   LYMPHSABS 0.6 (L) 11/10/2016 1656   LYMPHSABS 0.6 (L) 09/28/2016 1715   MONOABS 0.6 11/10/2016 1656   EOSABS 0.1 11/10/2016 1656   EOSABS 0.1 09/28/2016 1715   BASOSABS 0.0 11/10/2016 1656   BASOSABS 0.0 09/28/2016 1715    BMET    Component Value Date/Time    NA 134 (L) 11/11/2016 0130   NA 139 09/28/2016 1715   K 4.6 11/11/2016 0130   CL 101 11/11/2016 0130   CO2 24 11/11/2016 0130   GLUCOSE 129 (H) 11/11/2016 0130   BUN 28 (H) 11/11/2016 0130   BUN 18 09/28/2016 1715   CREATININE 1.12 (H) 11/11/2016 0130   CREATININE 0.82 01/27/2016 1747   CALCIUM 8.8 (L) 11/11/2016 0130   GFRNONAA 48 (L) 11/11/2016 0130   GFRAA 55 (L) 11/11/2016 0130    COAGS: Lab Results  Component Value Date  INR 0.96 11/11/2016   INR 0.99 01/06/2011     Non-Invasive Vascular Imaging:      IMPRESSION: 1. No pulmonary embolus. 2. Extensive anterior peritoneal nodular implants, consistent with carcinomatosis. Perihepatic and perisplenic ascites is likely malignant. 3. Lobulated right pelvic mass arising from either the right adnexa or the uterine fundus. This may be the primary malignancy. 4. Gallbladder filled stones but no other evidence of acute cholecystitis. 5. Multiple subcentimeter retroperitoneal nodes. In this context, these could indicate lymphatic spread of disease.   ASSESSMENT/PLAN: This is a 71 y.o. female with pelvic mass likely malignant and significant rle dvt. Ok for heparin and can ambulate and wear compression when fully anticoagulated. Would not place ivc filter unless she cannot be anticoagulated for at some point. Should be on lovenox as outpatient given cancer as likely etiology of dvt. Available as needed.   Kole Hilyard C. Donzetta Matters, MD [facility] Office: [phone] Pager: [phone]

## 2016-11-12 NOTE — Progress Notes (Signed)
ANTICOAGULATION CONSULT NOTE - Follow Up Consult  Pharmacy Consult for Heparin  Indication: DVT  Patient Measurements: Height: 5' 1.5" (156.2 cm) Weight: 161 lb (73 kg) IBW/kg (Calculated) : 48.95  Heparin dosing wt: 64.7 kg  Vital Signs: Temp: 98.2 F (36.8 C) (06/23 0438) Temp Source: Oral (06/23 0438) BP: 106/57 (06/23 0438) Pulse Rate: 77 (06/23 0438)  Labs:  Recent Labs  11/10/16 1656  11/11/16 0130 11/11/16 1231 11/11/16 2111 11/12/16 0217  HGB 12.4  --  13.2  --   --  11.9*  HCT 37.4  --  40.4  --   --  37.9  PLT 345  --  298  --   --  301  LABPROT  --   --   --  12.8  --   --   INR  --   --   --  0.96  --   --   HEPARINUNFRC  --   < > 0.13* 0.20* 0.33 0.30  CREATININE 1.15*  --  1.12*  --   --   --   < > = values in this interval not displayed. Estimated Creatinine Clearance: 42 mL/min (A) (by C-G formula based on SCr of 1.12 mg/dL (H)).  Assessment: 71 y/o F with new-onset DVT. Not on anticoagulation PTA. CBC wnl.  Noted tumor marker labs which places her at increased risk for thrombosis.  Heparin level therapeutic but at low end of range (0.3) on a rate of 1300 units/hr.   Goal of Therapy:  Heparin level 0.3-0.7 units/ml Monitor platelets by anticoagulation protocol: Yes   Plan:  - Increase heparin infusion slightly to 1350 units/hr  - Check heparin level, CBC with am labs and daily - Monitor for s/s of bleeding  Rober Minion, PharmD., MS Clinical Pharmacist Pager:  [phone] Thank you for allowing pharmacy to be part of this patients care team. 11/12/2016, 7:25 AM

## 2016-11-12 NOTE — Progress Notes (Addendum)
PROGRESS NOTE    Annette Farmer  WYO:[id number] DOB: 12-May-1946 DOA: 11/10/2016 PCP: Wardell Honour, MD    Brief Narrative:Annette Farmer is a 71 y.o. female with medical history significant of HTN, DM type II, CVA, and depression;  who presents with complaints of right leg swelling over the last 1 week Doppler ultrasound of the bilateral lower extremities revealed a extensive clot of the right saphenofemoral junction, common femoral, profunda, femoral, popliteal, posterior tibial, and peroneal veins.   Meanwhile CT abd and chest shows right adnexal tumour with carcinomatosis.  Consulted gyn oncologist recommended tumour markers and an omental biopsy which is scheduled to be done by IR if the ascitic fluid cytology comes negative.  US paracentesis ordered for diagnostic purposes. Ascitic fluid sent for cytology.  Meanwhile in view of her extensive DVT of the right LE, vascular surgery consulted to see if she needs thrombectomy. No thrombolytics or IVC unless she cant be on anti coagulation, plan to change to lovenox on discharge.   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  No PE on CT angiogram.  Vascular surgery consulted to see if she needs thrombectomy, for now to continue with IV heparin, possibly transition to lovenox sq on discharge.  Heparin levels are therapeutic the last 2 values, will get PT eval as pt lives alone.     Right pelvis mass possibly from the right adnexa:  Omental biopsy and Gyn Onc Consultation requested.  Recommendations given.  IR paracentesis done on 6/22 for diagnostic purposes and fluid sent for cytology and if its negative, she will need an omental biopsy either on  Monday or as outpatient.     AKI:  Suspect from dehydration/ pre renal azotemia.  Get urine electrolytes.  Hydrate and repeat renal parameters pending.  Holding nephrotoxic drugs.    Hypertension; Stop HCTZ.  Controlled.   Diabetes mellitus:  CBG (last 3)   Recent Labs  11/11/16 1631 11/11/16 2053 11/12/16 0628  GLUCAP 95 155* 105*    Resume SSI.   Right hip soreness:  Possibly from the right pelvic mass, pain control.    DVT prophylaxis: heparin.  Code Status: full code.  Family Communication: none at bedside.   Disposition Plan: possibly home in 1 to 2 days.    Consultants:   IR  GYN ONCOLOGY Dr Delsa Sale.  Vascular surgery.    Procedures:   Paracentesis ordered for diagnostic purpose.  IR consult for omental biopsy possibly on Monday.    Antimicrobials: none.    Subjective: Some leg discomfort.  No sob.  No chest pain, Objective: Vitals:   11/11/16 1538 11/11/16 1600 11/11/16 2034 11/12/16 0438  BP: 123/90 130/77 114/68 (!) 106/57  Pulse:  60 63 77  Resp:  16 18 16   Temp:   98.1 F (36.7 C) 98.2 F (36.8 C)  TempSrc:   Oral Oral  SpO2:  100% 99% 97%  Weight:      Height:        Intake/Output Summary (Last 24 hours) at 11/12/16 1108 Last data filed at 11/12/16 7741  Gross per 24 hour  Intake           944.29  ml  Output              800 ml  Net           144.29 ml   Filed Weights   11/11/16 0058  Weight: 73 kg (161 lb)    Examination:  General exam: calm and comfortable.  Respiratory system: clear, no wheezing or rhonchi.  Cardiovascular system: s1s2, RRR,  Gastrointestinal system: abd is soft NT ND bs+ Central nervous system: alert and oriented to place and person. Non focal.  Extremities: bilateral pedal edema. No cyanosis.  Skin: No rashes, lesions or ulcers Psychiatry:  Flat affect.     Data Reviewed: I have personally reviewed following labs and imaging studies  CBC:  Recent Labs Lab 11/10/16 1656  11/11/16 0130 11/12/16 0217  WBC 8.1 7.5 6.9  NEUTROABS 6.8  --   --   HGB 12.4 13.2 11.9*  HCT 37.4 40.4 37.9  MCV 89.3 90.8 90.9  PLT 345 298 818   Basic Metabolic Panel:  Recent Labs Lab 11/10/16 1656 11/11/16 0130  NA 135 134*  K 4.1 4.6  CL 102 101  CO2 23 24  GLUCOSE 150* 129*  BUN 33* 28*  CREATININE 1.15* 1.12*  CALCIUM 8.8* 8.8*   GFR: Estimated Creatinine Clearance: 42 mL/min (A) (by C-G formula based on SCr of 1.12 mg/dL (H)). Liver Function Tests:  Recent Labs Lab 11/10/16 1656  AST 24  ALT 12*  ALKPHOS 123  BILITOT 0.8  PROT 6.5  ALBUMIN 3.0*    Recent Labs Lab 11/10/16 1656  LIPASE 24   No results for input(s): AMMONIA in the last 168 hours. Coagulation Profile:  Recent Labs Lab 11/11/16 1231  INR 0.96   Cardiac Enzymes: No results for input(s): CKTOTAL, CKMB, CKMBINDEX, TROPONINI in the last 168 hours. BNP (last 3 results) No results for input(s): PROBNP in the last 8760 hours. HbA1C: No results for input(s): HGBA1C in the last 72 hours. CBG:  Recent Labs Lab 11/11/16 0640 11/11/16 1148 11/11/16 1631 11/11/16 2053 11/12/16 0628  GLUCAP 160* 135* 95 155* 105*   Lipid Profile: No results for input(s): CHOL, HDL, LDLCALC, TRIG, CHOLHDL, LDLDIRECT in the last 72 hours. Thyroid Function Tests: No results for input(s): TSH, T4TOTAL, FREET4, T3FREE, THYROIDAB in the last 72 hours. Anemia Panel: No results for input(s): VITAMINB12, FOLATE, FERRITIN, TIBC, IRON, RETICCTPCT in the last 72 hours. Sepsis Labs: No results for input(s): PROCALCITON, LATICACIDVEN in the last 168 hours.  Recent Results (from the past 240 hour(s))  Culture, body fluid-bottle     Status: None (Preliminary result)   Collection Time: 11/11/16  3:51 PM  Result Value Ref Range Status   Specimen Description FLUID PERITONEAL  Final   Special Requests BOTTLES DRAWN AEROBIC AND ANAEROBIC 10CC  Final   Culture NO GROWTH < 24 HOURS  Final   Report Status  PENDING  Incomplete  Gram stain     Status: None   Collection Time: 11/11/16  3:51 PM  Result Value Ref Range Status   Specimen Description FLUID PERITONEAL  Final   Special Requests NONE  Final   Gram Stain   Final    MODERATE WBC PRESENT,BOTH PMN AND MONONUCLEAR NO ORGANISMS SEEN    Report Status 11/12/2016 FINAL  Final         Radiology Studies: Dg Chest 2 View  Result Date: 11/10/2016 CLINICAL DATA:  Chest pain and right lower extremity DVT EXAM: CHEST  2 VIEW COMPARISON:  Chest radiograph  08/30/2015 FINDINGS: Shallow lung inflation without focal airspace disease or pulmonary edema. No pleural effusion or pneumothorax. Unchanged cardiomediastinal contours. IMPRESSION: No active cardiopulmonary disease. Electronically Signed   By: Ulyses Jarred M.D.   On: 11/10/2016 18:25   Ct Angio Chest Pe W And/or Wo Contrast  Result Date: 11/10/2016 CLINICAL DATA:  Shortness of breath, abdominal pain, right lower extremity DVT EXAM: CT ANGIOGRAPHY CHEST CT ABDOMEN AND PELVIS WITH CONTRAST TECHNIQUE: Multidetector CT imaging of the chest was performed using the standard protocol during bolus administration of intravenous contrast. Multiplanar CT image reconstructions and MIPs were obtained to evaluate the vascular anatomy. Multidetector CT imaging of the abdomen and pelvis was performed using the standard protocol during bolus administration of intravenous contrast. CONTRAST:  100 mL Isovue 370 IV COMPARISON:  Chest radiograph 12/24/2003 and 11/10/2016 FINDINGS: CTA CHEST FINDINGS Cardiovascular: Contrast injection is sufficient to demonstrate satisfactory opacification of the pulmonary arteries to the segmental level. There is no pulmonary embolus. The main pulmonary artery is within normal limits for size. There is no CT evidence of acute right heart strain. There is calcific aortic atherosclerosis. There are multifocal coronary artery calcifications. There is a normal 3-vessel arch branching pattern.  Heart size is normal, without pericardial effusion. Mediastinum/Nodes: No mediastinal, hilar or axillary lymphadenopathy. The visualized thyroid and thoracic esophageal course are unremarkable. Lungs/Pleura: No pulmonary nodules or masses. No pleural effusion or pneumothorax. No focal airspace consolidation. No focal pleural abnormality. Musculoskeletal: No chest wall abnormality. No acute or significant osseous findings. Review of the MIP images confirms the above findings. CT ABDOMEN and PELVIS FINDINGS Hepatobiliary: Moderate volume perihepatic ascites. No focal liver lesion. The gallbladder is filled with stones. Pancreas: Normal pancreatic contours and enhancement. No peripancreatic fluid collection or pancreatic ductal dilatation. Spleen: Medium volume perisplenic ascites. There is peritoneal nodularity particularly in the left upper quadrant and lower midline abdomen. Adrenals/Urinary Tract: Normal adrenal glands. No hydronephrosis or solid renal mass. Stomach/Bowel: There is no hiatal hernia. The stomach and duodenum are normal. There is no dilated small bowel or enteric inflammation. There is no colonic abnormality. Nonvisualization of the appendix. Vascular/Lymphatic: There is atherosclerotic calcification of the non aneurysmal abdominal aorta. Aortocaval lymph nodes measure up to 8 mm. Reproductive: There is a multilobulated mass of the right pelvis. It is unclear whether it arises from the uterine fundus to the right adnexa. There is a left ovarian cyst measuring 4 cm. Musculoskeletal: No lytic or blastic osseous lesion. Normal visualized extrathoracic and extraperitoneal soft tissues. Other: No contributory non-categorized findings. IMPRESSION: 1. No pulmonary embolus. 2. Extensive anterior peritoneal nodular implants, consistent with carcinomatosis. Perihepatic and perisplenic ascites is likely malignant. 3. Lobulated right pelvic mass arising from either the right adnexa or the uterine fundus. This  may be the primary malignancy. 4. Gallbladder filled stones but no other evidence of acute cholecystitis. 5. Multiple subcentimeter retroperitoneal nodes. In this context, these could indicate lymphatic spread of disease. Electronically Signed   By: Ulyses Jarred M.D.   On: 11/10/2016 19:29   Ct Abdomen Pelvis W Contrast  Result Date: 11/10/2016 CLINICAL DATA:  Shortness of breath, abdominal pain, right lower extremity DVT EXAM: CT ANGIOGRAPHY CHEST CT ABDOMEN AND PELVIS WITH CONTRAST TECHNIQUE: Multidetector CT imaging of the chest was performed using the standard protocol during bolus administration of intravenous contrast. Multiplanar CT image reconstructions and MIPs were obtained to evaluate the vascular anatomy. Multidetector CT imaging of the abdomen and pelvis was performed using the standard protocol during bolus administration of  intravenous contrast. CONTRAST:  100 mL Isovue 370 IV COMPARISON:  Chest radiograph 12/24/2003 and 11/10/2016 FINDINGS: CTA CHEST FINDINGS Cardiovascular: Contrast injection is sufficient to demonstrate satisfactory opacification of the pulmonary arteries to the segmental level. There is no pulmonary embolus. The main pulmonary artery is within normal limits for size. There is no CT evidence of acute right heart strain. There is calcific aortic atherosclerosis. There are multifocal coronary artery calcifications. There is a normal 3-vessel arch branching pattern. Heart size is normal, without pericardial effusion. Mediastinum/Nodes: No mediastinal, hilar or axillary lymphadenopathy. The visualized thyroid and thoracic esophageal course are unremarkable. Lungs/Pleura: No pulmonary nodules or masses. No pleural effusion or pneumothorax. No focal airspace consolidation. No focal pleural abnormality. Musculoskeletal: No chest wall abnormality. No acute or significant osseous findings. Review of the MIP images confirms the above findings. CT ABDOMEN and PELVIS FINDINGS  Hepatobiliary: Moderate volume perihepatic ascites. No focal liver lesion. The gallbladder is filled with stones. Pancreas: Normal pancreatic contours and enhancement. No peripancreatic fluid collection or pancreatic ductal dilatation. Spleen: Medium volume perisplenic ascites. There is peritoneal nodularity particularly in the left upper quadrant and lower midline abdomen. Adrenals/Urinary Tract: Normal adrenal glands. No hydronephrosis or solid renal mass. Stomach/Bowel: There is no hiatal hernia. The stomach and duodenum are normal. There is no dilated small bowel or enteric inflammation. There is no colonic abnormality. Nonvisualization of the appendix. Vascular/Lymphatic: There is atherosclerotic calcification of the non aneurysmal abdominal aorta. Aortocaval lymph nodes measure up to 8 mm. Reproductive: There is a multilobulated mass of the right pelvis. It is unclear whether it arises from the uterine fundus to the right adnexa. There is a left ovarian cyst measuring 4 cm. Musculoskeletal: No lytic or blastic osseous lesion. Normal visualized extrathoracic and extraperitoneal soft tissues. Other: No contributory non-categorized findings. IMPRESSION: 1. No pulmonary embolus. 2. Extensive anterior peritoneal nodular implants, consistent with carcinomatosis. Perihepatic and perisplenic ascites is likely malignant. 3. Lobulated right pelvic mass arising from either the right adnexa or the uterine fundus. This may be the primary malignancy. 4. Gallbladder filled stones but no other evidence of acute cholecystitis. 5. Multiple subcentimeter retroperitoneal nodes. In this context, these could indicate lymphatic spread of disease. Electronically Signed   By: Ulyses Jarred M.D.   On: 11/10/2016 19:29   Ir Paracentesis  Result Date: 11/11/2016 INDICATION: Probable carcinomatosis ascites EXAM: ULTRASOUND-GUIDED PARACENTESIS COMPARISON:  None. MEDICATIONS: 10 cc 1% lidocaine. COMPLICATIONS: None immediate. TECHNIQUE:  Informed written consent was obtained from the patient after a discussion of the risks, benefits and alternatives to treatment. A timeout was performed prior to the initiation of the procedure. Initial ultrasound scanning demonstrates a small amount of ascites within the right upper abdominal quadrant. The right upper abdomen was prepped and draped in the usual sterile fashion. 1% lidocaine with epinephrine was used for local anesthesia. Under direct ultrasound guidance, a 19 gauge, 7-cm, Yueh catheter was introduced. An ultrasound image was saved for documentation purposed. The paracentesis was performed. The catheter was removed and a dressing was applied. The patient tolerated the procedure well without immediate post procedural complication. FINDINGS: A total of approximately 420 cc of dark yellow fluid was removed. Samples were sent to the laboratory as requested by the clinical team. IMPRESSION: Successful ultrasound-guided paracentesis yielding 420 cc of peritoneal fluid. Read by Lavonia Drafts [facility] Electronically Signed   By: Jacqulynn Cadet M.D.   On: 11/11/2016 15:52        Scheduled Meds: . aspirin  325 mg Oral  Daily  . gabapentin  300 mg Oral Daily  . insulin aspart  0-5 Units Subcutaneous QHS  . insulin aspart  0-9 Units Subcutaneous TID WC  . losartan  50 mg Oral Daily  . metoprolol succinate  25 mg Oral Daily  . nystatin   Topical TID   Continuous Infusions: . sodium chloride 75 mL/hr at 11/12/16 0151  . heparin 1,350 Units/hr (11/12/16 0922)     LOS: 2 days    Time spent: 35 minutes.     Hosie Poisson, MD [facility] Pager [phone]  If 7PM-7AM, please contact night-coverage [email or web address] Password [facility] 11/12/2016, 11:08 AM

## 2016-11-13 LAB — URINALYSIS, ROUTINE W REFLEX MICROSCOPIC
BILIRUBIN URINE: NEGATIVE
GLUCOSE, UA: NEGATIVE mg/dL
HGB URINE DIPSTICK: NEGATIVE
Ketones, ur: NEGATIVE mg/dL
Leukocytes, UA: NEGATIVE
Nitrite: NEGATIVE
PROTEIN: NEGATIVE mg/dL
SPECIFIC GRAVITY, URINE: 1.017 (ref 1.005–1.030)
pH: 6 (ref 5.0–8.0)

## 2016-11-13 LAB — URINALYSIS, MICROSCOPIC (REFLEX)
Bacteria, UA: NONE SEEN
WBC, UA: NONE SEEN WBC/hpf (ref 0–5)

## 2016-11-13 LAB — GLUCOSE, CAPILLARY
GLUCOSE-CAPILLARY: 110 mg/dL — AB (ref 65–99)
GLUCOSE-CAPILLARY: 161 mg/dL — AB (ref 65–99)
GLUCOSE-CAPILLARY: 100 mg/dL — AB (ref 65–99)
Glucose-Capillary: 100 mg/dL — ABNORMAL HIGH (ref 65–99)

## 2016-11-13 LAB — CBC
HCT: 37.6 % (ref 36.0–46.0)
HEMOGLOBIN: 11.9 g/dL — AB (ref 12.0–15.0)
MCH: 29 pg (ref 26.0–34.0)
MCHC: 31.6 g/dL (ref 30.0–36.0)
MCV: 91.5 fL (ref 78.0–100.0)
Platelets: 275 10*3/uL (ref 150–400)
RBC: 4.11 MIL/uL (ref 3.87–5.11)
RDW: 13.9 % (ref 11.5–15.5)
WBC: 7.7 10*3/uL (ref 4.0–10.5)

## 2016-11-13 LAB — HEPARIN LEVEL (UNFRACTIONATED): HEPARIN UNFRACTIONATED: 0.3 [IU]/mL (ref 0.30–0.70)

## 2016-11-13 MED ORDER — PANTOPRAZOLE SODIUM 40 MG IV SOLR
40.0000 mg | Freq: Once | INTRAVENOUS | Status: AC
  Administered 2016-11-13: 40 mg via INTRAVENOUS
  Filled 2016-11-13: qty 40

## 2016-11-13 MED ORDER — GI COCKTAIL ~~LOC~~
30.0000 mL | Freq: Once | ORAL | Status: AC
  Administered 2016-11-13: 30 mL via ORAL
  Filled 2016-11-13: qty 30

## 2016-11-13 NOTE — Progress Notes (Signed)
ANTICOAGULATION CONSULT NOTE - Follow Up Consult  Pharmacy Consult for Heparin  Indication: DVT  Patient Measurements: Height: 5' 1.5" (156.2 cm) Weight: 161 lb (73 kg) IBW/kg (Calculated) : 48.95  Heparin dosing wt: 64.7 kg  Vital Signs: Temp: 98.4 F (36.9 C) (06/24 0619) Temp Source: Oral (06/24 0619) BP: 124/67 (06/24 0619) Pulse Rate: 58 (06/24 0619)  Labs:  Recent Labs  11/10/16 1656  11/11/16 0130 11/11/16 1231 11/11/16 2111 11/12/16 0217 11/12/16 1423 11/13/16 0535  HGB 12.4  --  13.2  --   --  11.9*  --  11.9*  HCT 37.4  --  40.4  --   --  37.9  --  37.6  PLT 345  --  298  --   --  301  --  275  LABPROT  --   --   --  12.8  --   --   --   --   INR  --   --   --  0.96  --   --   --   --   HEPARINUNFRC  --   < > 0.13* 0.20* 0.33 0.30  --  0.30  CREATININE 1.15*  --  1.12*  --   --   --  0.89  --   < > = values in this interval not displayed. Estimated Creatinine Clearance: 52.9 mL/min (by C-G formula based on SCr of 0.89 mg/dL).  Assessment: 71 y/o F with new-onset DVT. Not on anticoagulation PTA. CBC wnl.  Noted elevated tumor marker labs which places her at increased risk for thrombosis.  Heparin level therapeutic but at low end of range (0.3) on a rate of 1350 units/hr.   Goal of Therapy:  Heparin level 0.3-0.7 units/ml Monitor platelets by anticoagulation protocol: Yes   Plan:  - Increase heparin infusion slightly to 1400 units/hr  - Check heparin level, CBC daily with am labs - Monitor for s/s of bleeding - Follow up plans for Lovenox at discharge  [facility], Pharm.D., BCPS Clinical Pharmacist Pager: [phone] Clinical phone for 11/13/2016 from 8:30-4:00 is [phone]. After 4pm, please call Main [facility] [phone]) for assistance. 11/13/2016 10:38 AM

## 2016-11-13 NOTE — Progress Notes (Signed)
PROGRESS NOTE    Annette Farmer  VFI:[id number] DOB: 1946-02-17 DOA: 11/10/2016 PCP: Wardell Honour, MD    Brief Narrative:Annette Farmer is a 71 y.o. female with medical history significant of HTN, DM type II, CVA, and depression;  who presents with complaints of right leg swelling over the last 1 week Doppler ultrasound of the bilateral lower extremities revealed a extensive clot of the right saphenofemoral junction, common femoral, profunda, femoral, popliteal, posterior tibial, and peroneal veins.   Meanwhile CT abd and chest shows right adnexal tumour with carcinomatosis.  Consulted gyn oncologist recommended tumour markers and an omental biopsy which is scheduled to be done by IR if the ascitic fluid cytology comes negative.  US paracentesis ordered for diagnostic purposes. Ascitic fluid sent for cytology.  Meanwhile in view of her extensive DVT of the right LE, vascular surgery consulted to see if she needs thrombectomy. No thrombolytics or IVC unless she cant be on anti coagulation, plan to change to lovenox on discharge.   Assessment & Plan:   Principal Problem:   DVT (deep venous thrombosis) (HCC) Active Problems:   Type 2 diabetes mellitus (HCC)   HYPERTENSION, BENIGN SYSTEMIC   AKI (acute kidney injury) ([place])   Pelvic mass   Carcinomatosis (HCC)    Extensive DVT ON THE RIGHT :  IV heparin, started probably coagulopathic from ovarian cancer.  No PE on CT angiogram.  Vascular surgery consulted to see if she needs thrombectomy, for now to continue with IV heparin, possibly transition to lovenox sq on discharge.  Heparin levels are therapeutic the last 2 values, will get PT eval as pt lives alone.  PT evaluated, but pt refusing any therapy at this time.     Right pelvis mass possibly from the right adnexa:  Gyn Onc Consultation requested.  Recommendations given.  IR paracentesis done on 6/22 for diagnostic purposes and fluid sent for cytology and if its negative, she  will need an omental biopsy either on Monday or as outpatient.  Cytology from ascitic fluid still pending.  CA 125 elevated at 967 and CA 19 at 71. CEA level is negative. Discussed the results with the patient. Offered to talk to the family.     AKI:  Suspect from dehydration/ pre renal azotemia.  Get urine electrolytes.  Hydrate and repeat renal parameters show much improvement, back to baseline.   Holding nephrotoxic drugs.    Hypertension; Stop HCTZ.  Controlled.   Diabetes mellitus:  CBG (last 3)   Recent Labs  11/12/16 1801 11/12/16 2143 11/13/16 0617  GLUCAP 141* 111* 110*    Resume SSI. No change in meds.    Right hip soreness:  Possibly from the right pelvic mass, pain control.    GERD: requesting reflux medications. protonix and maalox added.    DVT prophylaxis: heparin.  Code Status: full code.  Family Communication: none at bedside.   Disposition Plan: possibly home in 1 to 2 days.    Consultants:   IR  GYN ONCOLOGY Dr Delsa Sale.  Vascular surgery.    Procedures:   Paracentesis ordered for diagnostic purpose.  IR consult for omental biopsy possibly on Monday.    Antimicrobials: none.    Subjective: Reflux, burning sensation sub sternally.  Mild abd discomfort, some shart shooting pains. , Objective: Vitals:   11/12/16 0438 11/12/16 1306 11/12/16 2146 11/13/16 0619  BP: (!) 106/57 112/63 133/69 124/67  Pulse: 77 95 (!) 58 (!) 58  Resp: 16 16 16  16  Temp: 98.2 F (36.8 C) 98.7 F (37.1 C) 98.9 F (37.2 C) 98.4 F (36.9 C)  TempSrc: Oral Oral Oral Oral  SpO2: 97% 96% 99% 97%  Weight:      Height:        Intake/Output Summary (Last 24 hours) at 11/13/16 0956 Last data filed at 11/13/16 0932  Gross per 24 hour  Intake              240 ml  Output             1300 ml  Net            -1060 ml   Filed Weights   11/11/16 0058  Weight: 73 kg (161 lb)    Examination:  General exam: in mild distress from reflux.    Respiratory system: clear, no wheezing or rhonchi.  Cardiovascular system: s1s2, RRR,  Gastrointestinal system: abd is soft tender generalized, mild.  Central nervous system: alert and oriented to place and person. Non focal.  Extremities: bilateral pedal edema. No cyanosis.  Skin: No rashes, lesions or ulcers Psychiatry:  Flat affect.     Data Reviewed: I have personally reviewed following labs and imaging studies  CBC:  Recent Labs Lab 11/10/16 1656 11/11/16 0130 11/12/16 0217 11/13/16 0535  WBC 8.1 7.5 6.9 7.7  NEUTROABS 6.8  --   --   --   HGB 12.4 13.2 11.9* 11.9*  HCT 37.4 40.4 37.9 37.6  MCV 89.3 90.8 90.9 91.5  PLT 345 298 301 518   Basic Metabolic Panel:  Recent Labs Lab 11/10/16 1656 11/11/16 0130 11/12/16 1423  NA 135 134* 135  K 4.1 4.6 3.9  CL 102 101 106  CO2 23 24 22   GLUCOSE 150* 129* 175*  BUN 33* 28* 15  CREATININE 1.15* 1.12* 0.89  CALCIUM 8.8* 8.8* 8.0*   GFR: Estimated Creatinine Clearance: 52.9 mL/min (by C-G formula based on SCr of 0.89 mg/dL). Liver Function Tests:  Recent Labs Lab 11/10/16 1656  AST 24  ALT 12*  ALKPHOS 123  BILITOT 0.8  PROT 6.5  ALBUMIN 3.0*    Recent Labs Lab 11/10/16 1656  LIPASE 24   No results for input(s): AMMONIA in the last 168 hours. Coagulation Profile:  Recent Labs Lab 11/11/16 1231  INR 0.96   Cardiac Enzymes: No results for input(s): CKTOTAL, CKMB, CKMBINDEX, TROPONINI in the last 168 hours. BNP (last 3 results) No results for input(s): PROBNP in the last 8760 hours. HbA1C: No results for input(s): HGBA1C in the last 72 hours. CBG:  Recent Labs Lab 11/12/16 0628 11/12/16 1133 11/12/16 1801 11/12/16 2143 11/13/16 0617  GLUCAP 105* 141* 141* 111* 110*   Lipid Profile: No results for input(s): CHOL, HDL, LDLCALC, TRIG, CHOLHDL, LDLDIRECT in the last 72 hours. Thyroid Function Tests: No results for input(s): TSH, T4TOTAL, FREET4, T3FREE, THYROIDAB in the last 72  hours. Anemia Panel: No results for input(s): VITAMINB12, FOLATE, FERRITIN, TIBC, IRON, RETICCTPCT in the last 72 hours. Sepsis Labs: No results for input(s): PROCALCITON, LATICACIDVEN in the last 168 hours.  Recent Results (from the past 240 hour(s))  Culture, body fluid-bottle     Status: None (Preliminary result)   Collection Time: 11/11/16  3:51 PM  Result Value Ref Range Status   Specimen Description FLUID PERITONEAL  Final   Special Requests BOTTLES DRAWN AEROBIC AND ANAEROBIC 10CC  Final   Culture NO GROWTH < 24 HOURS  Final   Report Status PENDING  Incomplete  Gram  stain     Status: None   Collection Time: 11/11/16  3:51 PM  Result Value Ref Range Status   Specimen Description FLUID PERITONEAL  Final   Special Requests NONE  Final   Gram Stain   Final    MODERATE WBC PRESENT,BOTH PMN AND MONONUCLEAR NO ORGANISMS SEEN    Report Status 11/12/2016 FINAL  Final         Radiology Studies: Ir Paracentesis  Result Date: 11/11/2016 INDICATION: Probable carcinomatosis ascites EXAM: ULTRASOUND-GUIDED PARACENTESIS COMPARISON:  None. MEDICATIONS: 10 cc 1% lidocaine. COMPLICATIONS: None immediate. TECHNIQUE: Informed written consent was obtained from the patient after a discussion of the risks, benefits and alternatives to treatment. A timeout was performed prior to the initiation of the procedure. Initial ultrasound scanning demonstrates a small amount of ascites within the right upper abdominal quadrant. The right upper abdomen was prepped and draped in the usual sterile fashion. 1% lidocaine with epinephrine was used for local anesthesia. Under direct ultrasound guidance, a 19 gauge, 7-cm, Yueh catheter was introduced. An ultrasound image was saved for documentation purposed. The paracentesis was performed. The catheter was removed and a dressing was applied. The patient tolerated the procedure well without immediate post procedural complication. FINDINGS: A total of approximately 420  cc of dark yellow fluid was removed. Samples were sent to the laboratory as requested by the clinical team. IMPRESSION: Successful ultrasound-guided paracentesis yielding 420 cc of peritoneal fluid. Read by Lavonia Drafts [facility] Electronically Signed   By: Jacqulynn Cadet M.D.   On: 11/11/2016 15:52        Scheduled Meds: . aspirin  325 mg Oral Daily  . gabapentin  300 mg Oral Daily  . gi cocktail  30 mL Oral Once  . insulin aspart  0-5 Units Subcutaneous QHS  . insulin aspart  0-9 Units Subcutaneous TID WC  . losartan  50 mg Oral Daily  . metoprolol succinate  25 mg Oral Daily  . nystatin   Topical TID  . pantoprazole (PROTONIX) IV  40 mg Intravenous Once   Continuous Infusions: . sodium chloride 75 mL/hr at 11/13/16 0247  . heparin 1,350 Units/hr (11/13/16 0247)     LOS: 3 days    Time spent: 45 minutes.     Hosie Poisson, MD [facility] Pager [phone]  If 7PM-7AM, please contact night-coverage [email or web address] Password [facility] 11/13/2016, 9:56 AM

## 2016-11-14 ENCOUNTER — Inpatient Hospital Stay (HOSPITAL_COMMUNITY): Payer: Medicare PPO

## 2016-11-14 ENCOUNTER — Encounter (HOSPITAL_COMMUNITY): Payer: Self-pay | Admitting: General Surgery

## 2016-11-14 DIAGNOSIS — K56699 Other intestinal obstruction unspecified as to partial versus complete obstruction: Secondary | ICD-10-CM

## 2016-11-14 LAB — CBC
HEMATOCRIT: 36.2 % (ref 36.0–46.0)
HEMOGLOBIN: 11.7 g/dL — AB (ref 12.0–15.0)
MCH: 29.1 pg (ref 26.0–34.0)
MCHC: 32.3 g/dL (ref 30.0–36.0)
MCV: 90 fL (ref 78.0–100.0)
Platelets: 285 10*3/uL (ref 150–400)
RBC: 4.02 MIL/uL (ref 3.87–5.11)
RDW: 13.8 % (ref 11.5–15.5)
WBC: 7.5 10*3/uL (ref 4.0–10.5)

## 2016-11-14 LAB — BASIC METABOLIC PANEL
Anion gap: 6 (ref 5–15)
BUN: 10 mg/dL (ref 6–20)
CALCIUM: 8 mg/dL — AB (ref 8.9–10.3)
CO2: 22 mmol/L (ref 22–32)
CREATININE: 0.72 mg/dL (ref 0.44–1.00)
Chloride: 107 mmol/L (ref 101–111)
GFR calc non Af Amer: >60 mL/min (ref >60)
Glucose, Bld: 148 mg/dL — ABNORMAL HIGH (ref 65–99)
Potassium: 4.3 mmol/L (ref 3.5–5.1)
SODIUM: 135 mmol/L (ref 135–145)

## 2016-11-14 LAB — GLUCOSE, CAPILLARY
Glucose-Capillary: 100 mg/dL — ABNORMAL HIGH (ref 65–99)
Glucose-Capillary: 124 mg/dL — ABNORMAL HIGH (ref 65–99)
Glucose-Capillary: 117 mg/dL — ABNORMAL HIGH (ref 65–99)
Glucose-Capillary: 119 mg/dL — ABNORMAL HIGH (ref 65–99)

## 2016-11-14 LAB — HEPARIN LEVEL (UNFRACTIONATED)
HEPARIN UNFRACTIONATED: 0.24 [IU]/mL — AB (ref 0.30–0.70)
HEPARIN UNFRACTIONATED: 0.34 [IU]/mL (ref 0.30–0.70)
Heparin Unfractionated: 0.29 IU/mL — ABNORMAL LOW (ref 0.30–0.70)

## 2016-11-14 MED ORDER — PROMETHAZINE HCL 25 MG/ML IJ SOLN
12.5000 mg | Freq: Four times a day (QID) | INTRAMUSCULAR | Status: DC | PRN
  Administered 2016-11-19 – 2016-11-24 (×5): 12.5 mg via INTRAVENOUS
  Filled 2016-11-14 (×5): qty 1

## 2016-11-14 MED ORDER — GI COCKTAIL ~~LOC~~
30.0000 mL | Freq: Three times a day (TID) | ORAL | Status: DC | PRN
  Administered 2016-11-20: 30 mL via ORAL
  Filled 2016-11-14 (×3): qty 30

## 2016-11-14 MED ORDER — MORPHINE SULFATE (PF) 2 MG/ML IV SOLN
1.0000 mg | INTRAVENOUS | Status: DC | PRN
  Administered 2016-11-14 – 2016-11-29 (×33): 2 mg via INTRAVENOUS
  Filled 2016-11-14 (×33): qty 1

## 2016-11-14 MED ORDER — PANTOPRAZOLE SODIUM 40 MG PO TBEC
40.0000 mg | DELAYED_RELEASE_TABLET | Freq: Every day | ORAL | Status: DC
  Administered 2016-11-14 – 2016-11-29 (×9): 40 mg via ORAL
  Filled 2016-11-14 (×15): qty 1

## 2016-11-14 MED ORDER — HEPARIN BOLUS VIA INFUSION
1500.0000 [IU] | Freq: Once | INTRAVENOUS | Status: AC
  Administered 2016-11-14: 1500 [IU] via INTRAVENOUS
  Filled 2016-11-14: qty 1500

## 2016-11-14 NOTE — Consult Note (Signed)
           [facility] Primary Care Navigator  11/14/2016  Annette Farmer 11-28-1945 [id number]   Went to see patient at the bedside to identify possible discharge needs. Patient reports having right leg swelling that was noted more than a week after she had a fall at her friend's home that had led to this admission. Patient endorses Dr. Reginia Forts with [facility] at [facility] the primary care provider.   Patient shared using [facility] Order service and [place] [facility] [place] to obtain medications without difficulty.   Patient reports managing her own medications at home using "pill box" system filled monthly.   She reports being able to drive prior to admission and hopes that she can do the same after discharge. Patient mentioned that her neighbor Dola Argyle) or friends can provide transportationto herdoctors'appointments.   Patient lives alone and independent but her neighbor and friends will be able to assist with her needs at home when needed per patient. Patient declined the [facility] list of personal caregiver services offered to her for future use.  Anticipated discharge plan is undetermined for now pending further evaluation. MD note states that she is not stable to be discharged home.    Patient expressedunderstanding to call primary care provider's officewhen she returns back home,for a post discharge follow-up appointment within a week or sooner if needed.Patient letter (with PCP's contact number) was provided as her reminder.  Explained to patient about [facility] CM services available for healthmanagement and she denies any current needs or concerns at this time. She states that her primary care provider has been helping manage her DM. She is on Metformin with recent A1C of 5.9. Patient voiced understanding to get a referral to [facility] care managementservicesfrom primary care provider if deemed necessaryin the future.   [facility] care management  information provided for future needs that may arise.  For questions, please contact:  Dannielle Huh, BSN, RN- [facility] Primary Care Navigator  Telephone: [phone] [place]

## 2016-11-14 NOTE — Progress Notes (Signed)
PROGRESS NOTE    Annette Farmer  YPP:[id number] DOB: 1946-04-03 DOA: 11/10/2016 PCP: Wardell Honour, MD    Brief Narrative:Annette Farmer is a 71 y.o. female with medical history significant of HTN, DM type II, CVA, and depression;  who presents with complaints of right leg swelling over the last 1 week Doppler ultrasound of the bilateral lower extremities revealed a extensive clot of the right saphenofemoral junction, common femoral, profunda, femoral, popliteal, posterior tibial, and peroneal veins.   Meanwhile CT abd and chest shows right adnexal tumour with carcinomatosis.  Consulted gyn oncologist recommended tumour markers and an omental biopsy which is scheduled to be done by IR if the ascitic fluid cytology comes negative.  US paracentesis ordered for diagnostic purposes. Ascitic fluid sent for cytology.  Meanwhile in view of her extensive DVT of the right LE, vascular surgery consulted to see if she needs thrombectomy. No thrombolytics or IVC unless she cant be on anti coagulation, plan to change to lovenox on discharge.   On 6/25, pt developed gross hematuria, became nauseated , and vomited feculent looking stuff.  ABD film shows partial sbo possibly from the carcinomatosis. Surgery consulted for recommendations.  IR re consulted to see if she needs IVC filter in view of the gross hematuria.  Awaiting cytology report from ascitic fluid.   Assessment & Plan:   Principal Problem:   DVT (deep venous thrombosis) (HCC) Active Problems:   Type 2 diabetes mellitus (HCC)   HYPERTENSION, BENIGN SYSTEMIC   AKI (acute kidney injury) ([place])   Pelvic mass   Carcinomatosis (HCC)    Extensive DVT ON THE RIGHT :  IV heparin, started probably coagulopathic from ovarian cancer.  No PE on CT angiogram.  Vascular surgery consulted to see if she needs thrombectomy, for now to continue with IV heparin, possibly transition to lovenox sq on discharge.  Heparin levels are therapeutic the last  2 values, will get PT eval as pt lives alone.  PT evaluated, but pt refusing any therapy at this time.  Gross hematuria today, hemoglobin stable around 11. If it continues to fall, will need to stop the IV heparin and get IVC filter.     Right pelvis mass possibly from the right adnexa:  Gyn Onc Consultation requested.  Recommendations given.  IR paracentesis done on 6/22 for diagnostic purposes and fluid sent for cytology and if its negative, she will need an omental biopsy as outpatient.  Cytology from ascitic fluid still pending.  CA 125 elevated at 967 and CA 19 at 71. CEA level is negative. Discussed the results with the patient. Offered to talk to the family.      AKI:  Suspect from dehydration/ pre renal azotemia.  Get urine electrolytes.  Hydrate and repeat renal parameters show much improvement, back to baseline.   Holding nephrotoxic drugs.    Hypertension; Stop HCTZ.  Controlled.   Diabetes mellitus:  CBG (last 3)   Recent Labs  11/13/16 2131 11/14/16 0558 11/14/16 1107  GLUCAP 100* 100* 124*    Resume SSI. No change in meds.    Right hip soreness:  Possibly from the right pelvic mass, pain control.    GERD: requesting reflux medications. protonix and maalox added.   Partial SBO; secondary to malignancy. Clear liquidd diet and if she starts vomiting , will need NG tube to be placed.  Surgery consulted and recommendations given.  Discussed with the patient and friends at bedside.    In view of her grave  prognosis, and worsening status, and her lack of family, requested palliative care consult for goals of care discussion and possibly from symptom management.    DVT prophylaxis: heparin.  Code Status: full code.  Family Communication: friends from the church at bedside.   Disposition Plan: pending further evaluation. Not stable to be discharged home.    Consultants:   IR  GYN ONCOLOGY Dr Delsa Sale.  Vascular surgery.   Surgery     Procedures:   Paracentesis ordered for diagnostic purpose.  IR consult for omental biopsy possibly on Monday.    Antimicrobials: none.    Subjective: Nausea, vomited,  No fever or chills.  Right hip pain.  Leg pain.  No chest pain or sob.   Objective: Vitals:   11/13/16 1305 11/13/16 2100 11/14/16 0301 11/14/16 1336  BP: (!) 110/59 133/64 126/65 107/71  Pulse: 64 64 68 (!) 105  Resp: 16 18 16 17   Temp: 98.1 F (36.7 C) 98.3 F (36.8 C) 98.9 F (37.2 C) 98 F (36.7 C)  TempSrc: Oral Oral Oral Oral  SpO2: 95% 98% 98% 96%  Weight:      Height:        Intake/Output Summary (Last 24 hours) at 11/14/16 1549 Last data filed at 11/14/16 1300  Gross per 24 hour  Intake              100 ml  Output              900 ml  Net             -800 ml   Filed Weights   11/11/16 0058  Weight: 73 kg (161 lb)    Examination:  General exam: in mild distress from nausea  Respiratory system: diminished at bases.  no wheezing or rhonchi.  Cardiovascular system: s1s2, RRR,  Gastrointestinal system: abd is soft tender generalized, moderate. Distended.  Central nervous system: alert and oriented to place and person. Non focal.  Extremities: bilateral pedal edema. No cyanosis.  Skin: No rashes, lesions or ulcers Psychiatry:  Flat affect.     Data Reviewed: I have personally reviewed following labs and imaging studies  CBC:  Recent Labs Lab 11/10/16 1656 11/11/16 0130 11/12/16 0217 11/13/16 0535 11/14/16 0249  WBC 8.1 7.5 6.9 7.7 7.5  NEUTROABS 6.8  --   --   --   --   HGB 12.4 13.2 11.9* 11.9* 11.7*  HCT 37.4 40.4 37.9 37.6 36.2  MCV 89.3 90.8 90.9 91.5 90.0  PLT 345 298 301 275 389   Basic Metabolic Panel:  Recent Labs Lab 11/10/16 1656 11/11/16 0130 11/12/16 1423 11/14/16 1244  NA 135 134* 135 135  K 4.1 4.6 3.9 4.3  CL 102 101 106 107  CO2 23 24 22 22   GLUCOSE 150* 129* 175* 148*  BUN 33* 28* 15 10  CREATININE 1.15* 1.12* 0.89 0.72  CALCIUM 8.8*  8.8* 8.0* 8.0*   GFR: Estimated Creatinine Clearance: 58.8 mL/min (by C-G formula based on SCr of 0.72 mg/dL). Liver Function Tests:  Recent Labs Lab 11/10/16 1656  AST 24  ALT 12*  ALKPHOS 123  BILITOT 0.8  PROT 6.5  ALBUMIN 3.0*    Recent Labs Lab 11/10/16 1656  LIPASE 24   No results for input(s): AMMONIA in the last 168 hours. Coagulation Profile:  Recent Labs Lab 11/11/16 1231  INR 0.96   Cardiac Enzymes: No results for input(s): CKTOTAL, CKMB, CKMBINDEX, TROPONINI in the last 168 hours. BNP (last  3 results) No results for input(s): PROBNP in the last 8760 hours. HbA1C: No results for input(s): HGBA1C in the last 72 hours. CBG:  Recent Labs Lab 11/13/16 1128 11/13/16 1628 11/13/16 2131 11/14/16 0558 11/14/16 1107  GLUCAP 161* 100* 100* 100* 124*   Lipid Profile: No results for input(s): CHOL, HDL, LDLCALC, TRIG, CHOLHDL, LDLDIRECT in the last 72 hours. Thyroid Function Tests: No results for input(s): TSH, T4TOTAL, FREET4, T3FREE, THYROIDAB in the last 72 hours. Anemia Panel: No results for input(s): VITAMINB12, FOLATE, FERRITIN, TIBC, IRON, RETICCTPCT in the last 72 hours. Sepsis Labs: No results for input(s): PROCALCITON, LATICACIDVEN in the last 168 hours.  Recent Results (from the past 240 hour(s))  Culture, body fluid-bottle     Status: None (Preliminary result)   Collection Time: 11/11/16  3:51 PM  Result Value Ref Range Status   Specimen Description FLUID PERITONEAL  Final   Special Requests BOTTLES DRAWN AEROBIC AND ANAEROBIC 10CC  Final   Culture NO GROWTH 3 DAYS  Final   Report Status PENDING  Incomplete  Gram stain     Status: None   Collection Time: 11/11/16  3:51 PM  Result Value Ref Range Status   Specimen Description FLUID PERITONEAL  Final   Special Requests NONE  Final   Gram Stain   Final    MODERATE WBC PRESENT,BOTH PMN AND MONONUCLEAR NO ORGANISMS SEEN    Report Status 11/12/2016 FINAL  Final         Radiology  Studies: Dg Abd Portable 1v  Result Date: 11/14/2016 CLINICAL DATA:  Small bowel obstruction, intra-abdominal ascites an malignancy. EXAM: PORTABLE ABDOMEN - 1 VIEW COMPARISON:  Abdominal and pelvic CT scan of November 10, 2016 FINDINGS: There are loops of mildly distended gas-filled small bowel present. There is stool and gas within normal caliber colon and rectum. No free extraluminal gas collections are observed. IMPRESSION: The bowel gas pattern is consistent with small bowel ileus or partial mid to distal small bowel obstruction. Electronically Signed   By: David  Martinique M.D.   On: 11/14/2016 11:43        Scheduled Meds: . aspirin  325 mg Oral Daily  . gabapentin  300 mg Oral Daily  . insulin aspart  0-5 Units Subcutaneous QHS  . insulin aspart  0-9 Units Subcutaneous TID WC  . losartan  50 mg Oral Daily  . metoprolol succinate  25 mg Oral Daily  . nystatin   Topical TID  . pantoprazole  40 mg Oral Q0600   Continuous Infusions: . sodium chloride 75 mL/hr at 11/14/16 0314  . heparin 1,450 Units/hr (11/14/16 0510)     LOS: 4 days    Time spent: 45 minutes.     Hosie Poisson, MD [facility] Pager [phone]  If 7PM-7AM, please contact night-coverage [email or web address] Password [facility] 11/14/2016, 3:49 PM

## 2016-11-14 NOTE — Progress Notes (Signed)
ANTICOAGULATION CONSULT NOTE - Follow Up Consult  Pharmacy Consult for Heparin  Indication: DVT  Patient Measurements: Height: 5' 1.5" (156.2 cm) Weight: 161 lb (73 kg) IBW/kg (Calculated) : 48.95  HDWt: 64.7 kg  Vital Signs: Temp: 97.9 F (36.6 C) (06/25 2220) Temp Source: Oral (06/25 2220) BP: 93/52 (06/25 2220) Pulse Rate: 71 (06/25 2220)  Labs:  Recent Labs  11/12/16 0217 11/12/16 1423 11/13/16 0535 11/14/16 0249 11/14/16 1244 11/14/16 1417 11/14/16 2153  HGB 11.9*  --  11.9* 11.7*  --   --   --   HCT 37.9  --  37.6 36.2  --   --   --   PLT 301  --  275 285  --   --   --   HEPARINUNFRC 0.30  --  0.30 0.24*  --  0.29* 0.34  CREATININE  --  0.89  --   --  0.72  --   --     Estimated Creatinine Clearance: 58.8 mL/min (by C-G formula based on SCr of 0.72 mg/dL).  Assessment: Annette Farmer continuing on heparin for DVT. Plans to transition to Lovenox for outpatient therapy in setting of likely CA. CBC stable. Per RN, no IV line issues, but hematuria has continued over last couple days, getting "redder" per RN but states MD is aware and wants to continue heparin for now. RN to notify if worsens.  Heparin level therapeutic: 0.34 - RN reports continued dark urine  Goal of Therapy:  Heparin level 0.3-0.7 units/ml Monitor platelets by anticoagulation protocol: Yes   Plan:  Continue heparin gtt at 1500 units/hr Daily heparin level/CBC Monitor for bleeding  Georga Bora, PharmD Clinical Pharmacist 11/14/2016 10:31 PM

## 2016-11-14 NOTE — Consult Note (Signed)
Reason for Consult:possible malignant sbo Referring Physician: Dr Sharalyn Ink is an 71 y.o. female.  HPI: 74 yof admitted several days ago who has history of dm and stroke. She was admitted for right leg swelling and found to have significant rle dvt.  She is heparinized and seen by vascular surgery. She underwent evaluation with pe ct and was found to have no pe but did have a lot of anterior nodular implants likely omental with ascites. There is a right pelvics mass aldo and multiple subcm rp nodes also. She has lost 10 lbs over last few months and not had as much of an appetite. She had no obstructive symptoms until this am when she had emesis which was her last several meals. She is tolerating liquids.  She has some flatus and had a bm Saturday.  We were called today for concern for malignant sbo with xray done today that shows possible sbo.   She had recent paracentesis that cytology is pending.  She has been seen by gyn onc.    Past Medical History:  Diagnosis Date  . Depression   . Diabetes mellitus without complication ([place])   . Hypertension   . Stroke [facility])     Past Surgical History:  Procedure Laterality Date  . HERNIA REPAIR    . IR PARACENTESIS  11/11/2016  . UMBILICAL HERNIA REPAIR      Family History  Problem Relation Age of Onset  . Parkinson's disease Mother   . Dementia Father   . Cancer Sister 36       Breast cancer    Social History:  reports that she has never smoked. She has never used smokeless tobacco. She reports that she does not drink alcohol or use drugs.  Allergies:  Allergies  Allergen Reactions  . Penicillins Nausea Only    Has patient had a PCN reaction causing immediate rash, facial/tongue/throat swelling, SOB or lightheadedness with hypotension: NO Has patient had a PCN reaction causing severe rash involving mucus membranes or skin necrosis: NO Has patient had a PCN reaction that required hospitalization: NO Has patient had a PCN  reaction occurring within the last 10 years: NO If all of the above answers are "NO", then may proceed with Cephalosporin use.     Medications: I have reviewed the patient's current medications.  Results for orders placed or performed during the hospital encounter of 11/10/16 (from the past 48 hour(s))  Glucose, capillary     Status: Abnormal   Collection Time: 11/12/16  6:01 PM  Result Value Ref Range   Glucose-Capillary 141 (H) 65 - 99 mg/dL  Glucose, capillary     Status: Abnormal   Collection Time: 11/12/16  9:43 PM  Result Value Ref Range   Glucose-Capillary 111 (H) 65 - 99 mg/dL  Heparin level (unfractionated)     Status: None   Collection Time: 11/13/16  5:35 AM  Result Value Ref Range   Heparin Unfractionated 0.30 0.30 - 0.70 IU/mL    Comment:        IF HEPARIN RESULTS ARE BELOW EXPECTED VALUES, AND PATIENT DOSAGE HAS BEEN CONFIRMED, SUGGEST FOLLOW UP TESTING OF ANTITHROMBIN III LEVELS.   CBC     Status: Abnormal   Collection Time: 11/13/16  5:35 AM  Result Value Ref Range   WBC 7.7 4.0 - 10.5 K/uL   RBC 4.11 3.87 - 5.11 MIL/uL   Hemoglobin 11.9 (L) 12.0 - 15.0 g/dL   HCT 37.6 36.0 - 46.0 %  MCV 91.5 78.0 - 100.0 fL   MCH 29.0 26.0 - 34.0 pg   MCHC 31.6 30.0 - 36.0 g/dL   RDW 13.9 11.5 - 15.5 %   Platelets 275 150 - 400 K/uL  Glucose, capillary     Status: Abnormal   Collection Time: 11/13/16  6:17 AM  Result Value Ref Range   Glucose-Capillary 110 (H) 65 - 99 mg/dL  Urinalysis, Routine w reflex microscopic     Status: Abnormal   Collection Time: 11/13/16  9:33 AM  Result Value Ref Range   Color, Urine RED (A) YELLOW    Comment: BIOCHEMICALS MAY BE AFFECTED BY COLOR   APPearance TURBID (A) CLEAR   Specific Gravity, Urine 1.017 1.005 - 1.030   pH 6.0 5.0 - 8.0   Glucose, UA NEGATIVE NEGATIVE mg/dL   Hgb urine dipstick NEGATIVE NEGATIVE   Bilirubin Urine NEGATIVE NEGATIVE   Ketones, ur NEGATIVE NEGATIVE mg/dL   Protein, ur NEGATIVE NEGATIVE mg/dL    Nitrite NEGATIVE NEGATIVE   Leukocytes, UA NEGATIVE NEGATIVE  Urinalysis, Microscopic (reflex)     Status: Abnormal   Collection Time: 11/13/16  9:33 AM  Result Value Ref Range   RBC / HPF TOO NUMEROUS TO COUNT 0 - 5 RBC/hpf   WBC, UA NONE SEEN 0 - 5 WBC/hpf   Bacteria, UA NONE SEEN NONE SEEN   Squamous Epithelial / LPF 0-5 (A) NONE SEEN  Glucose, capillary     Status: Abnormal   Collection Time: 11/13/16 11:28 AM  Result Value Ref Range   Glucose-Capillary 161 (H) 65 - 99 mg/dL  Glucose, capillary     Status: Abnormal   Collection Time: 11/13/16  4:28 PM  Result Value Ref Range   Glucose-Capillary 100 (H) 65 - 99 mg/dL  Glucose, capillary     Status: Abnormal   Collection Time: 11/13/16  9:31 PM  Result Value Ref Range   Glucose-Capillary 100 (H) 65 - 99 mg/dL  Heparin level (unfractionated)     Status: Abnormal   Collection Time: 11/14/16  2:49 AM  Result Value Ref Range   Heparin Unfractionated 0.24 (L) 0.30 - 0.70 IU/mL    Comment:        IF HEPARIN RESULTS ARE BELOW EXPECTED VALUES, AND PATIENT DOSAGE HAS BEEN CONFIRMED, SUGGEST FOLLOW UP TESTING OF ANTITHROMBIN III LEVELS.   CBC     Status: Abnormal   Collection Time: 11/14/16  2:49 AM  Result Value Ref Range   WBC 7.5 4.0 - 10.5 K/uL   RBC 4.02 3.87 - 5.11 MIL/uL   Hemoglobin 11.7 (L) 12.0 - 15.0 g/dL   HCT 36.2 36.0 - 46.0 %   MCV 90.0 78.0 - 100.0 fL   MCH 29.1 26.0 - 34.0 pg   MCHC 32.3 30.0 - 36.0 g/dL   RDW 13.8 11.5 - 15.5 %   Platelets 285 150 - 400 K/uL  Glucose, capillary     Status: Abnormal   Collection Time: 11/14/16  5:58 AM  Result Value Ref Range   Glucose-Capillary 100 (H) 65 - 99 mg/dL  Glucose, capillary     Status: Abnormal   Collection Time: 11/14/16 11:07 AM  Result Value Ref Range   Glucose-Capillary 124 (H) 65 - 99 mg/dL   Comment 1 Notify RN   Basic metabolic panel     Status: Abnormal   Collection Time: 11/14/16 12:44 PM  Result Value Ref Range   Sodium 135 135 - 145 mmol/L    Potassium 4.3 3.5 -  5.1 mmol/L   Chloride 107 101 - 111 mmol/L   CO2 22 22 - 32 mmol/L   Glucose, Bld 148 (H) 65 - 99 mg/dL   BUN 10 6 - 20 mg/dL   Creatinine, Ser 0.72 0.44 - 1.00 mg/dL   Calcium 8.0 (L) 8.9 - 10.3 mg/dL   GFR calc non Af Amer >60 >60 mL/min   GFR calc Af Amer >60 >60 mL/min    Comment: (NOTE) The eGFR has been calculated using the CKD EPI equation. This calculation has not been validated in all clinical situations. eGFR's persistently <60 mL/min signify possible Chronic Kidney Disease.    Anion gap 6 5 - 15    Dg Abd Portable 1v  Result Date: 11/14/2016 CLINICAL DATA:  Small bowel obstruction, intra-abdominal ascites an malignancy. EXAM: PORTABLE ABDOMEN - 1 VIEW COMPARISON:  Abdominal and pelvic CT scan of November 10, 2016 FINDINGS: There are loops of mildly distended gas-filled small bowel present. There is stool and gas within normal caliber colon and rectum. No free extraluminal gas collections are observed. IMPRESSION: The bowel gas pattern is consistent with small bowel ileus or partial mid to distal small bowel obstruction. Electronically Signed   By: David  Martinique M.D.   On: 11/14/2016 11:43    Review of Systems  Cardiovascular: Positive for leg swelling (right).  Gastrointestinal: Positive for nausea and vomiting. Negative for abdominal pain.  All other systems reviewed and are negative.  Blood pressure 107/71, pulse (!) 105, temperature 98 F (36.7 C), temperature source Oral, resp. rate 17, height 5' 1.5" (1.562 m), weight 73 kg (161 lb), SpO2 96 %. Physical Exam  Vitals reviewed. Constitutional: She is oriented to person, place, and time. She appears well-developed.  HENT:  Head: Normocephalic and atraumatic.  Right Ear: External ear normal.  Left Ear: External ear normal.  Mouth/Throat: Oropharynx is clear and moist.  Eyes: EOM are normal. No scleral icterus.  Neck: Neck supple. Erythema: no [place] adenopathy.  Cardiovascular: Normal rate, regular  rhythm, normal heart sounds and intact distal pulses.   Respiratory: Effort normal and breath sounds normal. She has no wheezes. She has no rales.  GI: She exhibits distension (moderate). Bowel sounds are decreased. There is no tenderness. No hernia.  Healed infraumbilical scar   Musculoskeletal: She exhibits edema (rle).  Lymphadenopathy:    She has no cervical adenopathy.  Neurological: She is alert and oriented to person, place, and time.  Skin: Skin is warm and dry.    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  She is tolerating liquids now.  Will let her continue liquids only for now repeat xray in am.  If she has any more emesis than needs ng tube placed. May very well need ct with contrast also at some point.  I discussed plan with her.  Discussed role of surgery and malignant sbo also.    Amillya Chavira 11/14/2016, 2:24 PM

## 2016-11-14 NOTE — Consult Note (Signed)
Chief Complaint: RLE DVT, hematuria, peritoneal carcinomatosis   Referring Physician:Dr. Hosie Poisson  Supervising Physician: Sandi Mariscal  Patient Status: [facility] - In-pt  HPI: ANNALEAH Farmer is a 71 y.o. female with a history of DM, HTN, CVA who presented to the ED several days ago secondary to RLE pain and edema.  She was found to have an extensive RLE DVT.  She was also noted to have a pelvic mass with extensive carcinomatosis.  She was started on heparin for her DVT.  She began having nausea and vomiting apparently this morning, nonbloody.  She was noted however, that she began to have some hematuria within the last couple of days.  Oddly enough though, her hgb has been stable over the last two days.  She still has heparin in place, but IR have been asked to see her for possible IVC filter placement.  She also had a paracentesis on Friday and cytology is still pending; however, she will likely require an omental biopsy for diagnosis.  Past Medical History:  Past Medical History:  Diagnosis Date  . Depression   . Diabetes mellitus without complication ([place])   . Hypertension   . Stroke [facility])     Past Surgical History:  Past Surgical History:  Procedure Laterality Date  . HERNIA REPAIR    . IR PARACENTESIS  11/11/2016  . UMBILICAL HERNIA REPAIR      Family History:  Family History  Problem Relation Age of Onset  . Parkinson's disease Mother   . Dementia Father   . Cancer Sister 72       Breast cancer    Social History:  reports that she has never smoked. She has never used smokeless tobacco. She reports that she does not drink alcohol or use drugs.  Allergies:  Allergies  Allergen Reactions  . Penicillins Nausea Only    Has patient had a PCN reaction causing immediate rash, facial/tongue/throat swelling, SOB or lightheadedness with hypotension: NO Has patient had a PCN reaction causing severe rash involving mucus membranes or skin necrosis: NO Has patient had a PCN  reaction that required hospitalization: NO Has patient had a PCN reaction occurring within the last 10 years: NO If all of the above answers are "NO", then may proceed with Cephalosporin use.     Medications: Medications reviewed in epic.  Please HPI for pertinent positives, otherwise complete 10 system ROS negative, some intermittent nausea, minimal intermittent abdominal pain.  Mallampati Score: MD Evaluation Airway: WNL Heart: WNL Abdomen: WNL Chest/ Lungs: WNL ASA  Classification: 3 Mallampati/Airway Score: Two  Physical Exam: BP 107/71 (BP Location: Right Arm)   Pulse (!) 105   Temp 98 F (36.7 C) (Oral)   Resp 17   Ht 5' 1.5" (1.562 m)   Wt 161 lb (73 kg)   SpO2 96%   BMI 29.93 kg/m  Body mass index is 29.93 kg/m. General: pleasant, WD, WN white female who is laying in bed in NAD HEENT: head is normocephalic, atraumatic.  Sclera are noninjected.  PERRL.  Ears and nose without any masses or lesions.  Mouth is pink and moist Heart: regular, rate, and rhythm.  Normal s1,s2. No obvious murmurs, gallops, or rubs noted.  Palpable radial pulses bilaterally Lungs: CTAB, no wheezes, rhonchi, or rales noted.  Respiratory effort nonlabored Abd: soft, NT, ND, +BS, no masses, hernias, or organomegaly MS: all 4 extremities are symmetrical with no cyanosis, clubbing, or edema, except RLE still have some edema, but is nontender  right now after morphine Psych: A&Ox3 with a somewhat flat affect.   Labs: Results for orders placed or performed during the hospital encounter of 11/10/16 (from the past 48 hour(s))  Glucose, capillary     Status: Abnormal   Collection Time: 11/12/16  6:01 PM  Result Value Ref Range   Glucose-Capillary 141 (H) 65 - 99 mg/dL  Glucose, capillary     Status: Abnormal   Collection Time: 11/12/16  9:43 PM  Result Value Ref Range   Glucose-Capillary 111 (H) 65 - 99 mg/dL  Heparin level (unfractionated)     Status: None   Collection Time: 11/13/16  5:35 AM    Result Value Ref Range   Heparin Unfractionated 0.30 0.30 - 0.70 IU/mL    Comment:        IF HEPARIN RESULTS ARE BELOW EXPECTED VALUES, AND PATIENT DOSAGE HAS BEEN CONFIRMED, SUGGEST FOLLOW UP TESTING OF ANTITHROMBIN III LEVELS.   CBC     Status: Abnormal   Collection Time: 11/13/16  5:35 AM  Result Value Ref Range   WBC 7.7 4.0 - 10.5 K/uL   RBC 4.11 3.87 - 5.11 MIL/uL   Hemoglobin 11.9 (L) 12.0 - 15.0 g/dL   HCT 37.6 36.0 - 46.0 %   MCV 91.5 78.0 - 100.0 fL   MCH 29.0 26.0 - 34.0 pg   MCHC 31.6 30.0 - 36.0 g/dL   RDW 13.9 11.5 - 15.5 %   Platelets 275 150 - 400 K/uL  Glucose, capillary     Status: Abnormal   Collection Time: 11/13/16  6:17 AM  Result Value Ref Range   Glucose-Capillary 110 (H) 65 - 99 mg/dL  Urinalysis, Routine w reflex microscopic     Status: Abnormal   Collection Time: 11/13/16  9:33 AM  Result Value Ref Range   Color, Urine RED (A) YELLOW    Comment: BIOCHEMICALS MAY BE AFFECTED BY COLOR   APPearance TURBID (A) CLEAR   Specific Gravity, Urine 1.017 1.005 - 1.030   pH 6.0 5.0 - 8.0   Glucose, UA NEGATIVE NEGATIVE mg/dL   Hgb urine dipstick NEGATIVE NEGATIVE   Bilirubin Urine NEGATIVE NEGATIVE   Ketones, ur NEGATIVE NEGATIVE mg/dL   Protein, ur NEGATIVE NEGATIVE mg/dL   Nitrite NEGATIVE NEGATIVE   Leukocytes, UA NEGATIVE NEGATIVE  Urinalysis, Microscopic (reflex)     Status: Abnormal   Collection Time: 11/13/16  9:33 AM  Result Value Ref Range   RBC / HPF TOO NUMEROUS TO COUNT 0 - 5 RBC/hpf   WBC, UA NONE SEEN 0 - 5 WBC/hpf   Bacteria, UA NONE SEEN NONE SEEN   Squamous Epithelial / LPF 0-5 (A) NONE SEEN  Glucose, capillary     Status: Abnormal   Collection Time: 11/13/16 11:28 AM  Result Value Ref Range   Glucose-Capillary 161 (H) 65 - 99 mg/dL  Glucose, capillary     Status: Abnormal   Collection Time: 11/13/16  4:28 PM  Result Value Ref Range   Glucose-Capillary 100 (H) 65 - 99 mg/dL  Glucose, capillary     Status: Abnormal   Collection  Time: 11/13/16  9:31 PM  Result Value Ref Range   Glucose-Capillary 100 (H) 65 - 99 mg/dL  Heparin level (unfractionated)     Status: Abnormal   Collection Time: 11/14/16  2:49 AM  Result Value Ref Range   Heparin Unfractionated 0.24 (L) 0.30 - 0.70 IU/mL    Comment:        IF HEPARIN RESULTS ARE BELOW  EXPECTED VALUES, AND PATIENT DOSAGE HAS BEEN CONFIRMED, SUGGEST FOLLOW UP TESTING OF ANTITHROMBIN III LEVELS.   CBC     Status: Abnormal   Collection Time: 11/14/16  2:49 AM  Result Value Ref Range   WBC 7.5 4.0 - 10.5 K/uL   RBC 4.02 3.87 - 5.11 MIL/uL   Hemoglobin 11.7 (L) 12.0 - 15.0 g/dL   HCT 36.2 36.0 - 46.0 %   MCV 90.0 78.0 - 100.0 fL   MCH 29.1 26.0 - 34.0 pg   MCHC 32.3 30.0 - 36.0 g/dL   RDW 13.8 11.5 - 15.5 %   Platelets 285 150 - 400 K/uL  Glucose, capillary     Status: Abnormal   Collection Time: 11/14/16  5:58 AM  Result Value Ref Range   Glucose-Capillary 100 (H) 65 - 99 mg/dL  Glucose, capillary     Status: Abnormal   Collection Time: 11/14/16 11:07 AM  Result Value Ref Range   Glucose-Capillary 124 (H) 65 - 99 mg/dL   Comment 1 Notify RN   Basic metabolic panel     Status: Abnormal   Collection Time: 11/14/16 12:44 PM  Result Value Ref Range   Sodium 135 135 - 145 mmol/L   Potassium 4.3 3.5 - 5.1 mmol/L   Chloride 107 101 - 111 mmol/L   CO2 22 22 - 32 mmol/L   Glucose, Bld 148 (H) 65 - 99 mg/dL   BUN 10 6 - 20 mg/dL   Creatinine, Ser 0.72 0.44 - 1.00 mg/dL   Calcium 8.0 (L) 8.9 - 10.3 mg/dL   GFR calc non Af Amer >60 >60 mL/min   GFR calc Af Amer >60 >60 mL/min    Comment: (NOTE) The eGFR has been calculated using the CKD EPI equation. This calculation has not been validated in all clinical situations. eGFR's persistently <60 mL/min signify possible Chronic Kidney Disease.    Anion gap 6 5 - 15  Heparin level (unfractionated)     Status: Abnormal   Collection Time: 11/14/16  2:17 PM  Result Value Ref Range   Heparin Unfractionated 0.29 (L)  0.30 - 0.70 IU/mL    Comment:        IF HEPARIN RESULTS ARE BELOW EXPECTED VALUES, AND PATIENT DOSAGE HAS BEEN CONFIRMED, SUGGEST FOLLOW UP TESTING OF ANTITHROMBIN III LEVELS.     Imaging: Dg Abd Portable 1v  Result Date: 11/14/2016 CLINICAL DATA:  Small bowel obstruction, intra-abdominal ascites an malignancy. EXAM: PORTABLE ABDOMEN - 1 VIEW COMPARISON:  Abdominal and pelvic CT scan of November 10, 2016 FINDINGS: There are loops of mildly distended gas-filled small bowel present. There is stool and gas within normal caliber colon and rectum. No free extraluminal gas collections are observed. IMPRESSION: The bowel gas pattern is consistent with small bowel ileus or partial mid to distal small bowel obstruction. Electronically Signed   By: David  Martinique M.D.   On: 11/14/2016 11:43    Assessment/Plan 1. RLE DVT with hematuria on heparin gtt. 2. Pelvic mass with carcinomatosis  I saw the patient to discuss the possibility of an IVC filter placement as well as a possible omental biopsy.  The patient was unaware of either of these two things.  She is very hesitant to move forward at this time.  She would like some time to think about her situation.  She also feels as if she would like more opinions, but she has minimal family.  She is also concerned as she has seen commercials on TV about IVC  filters.  She is concerned about biopsies because "everyone I know who has had a biopsy has died."  She states that they have not died from the biopsy, but she now associated biopsies with death.  The patient would like time to think about her decisions.  She will be NPO p MN and someone with IR will check with her in the morning to see what she has decided.  Thank you for this interesting consult.  I greatly enjoyed meeting LILLIEANN PAVLICH and look forward to participating in their care.  A copy of this report was sent to the requesting provider on this date.  Electronically Signed: Henreitta Cea 11/14/2016,  3:57 PM   I spent a total of 40 Minutes    in face to face in clinical consultation, greater than 50% of which was counseling/coordinating care for hematuria, RLE DVT, peritoneal carcinomatosis with pelvic mass

## 2016-11-14 NOTE — Progress Notes (Signed)
Physical Therapy Treatment Patient Details Name: Annette Farmer MRN: [id number] DOB: 1946/03/21 Today's Date: 11/14/2016    History of Present Illness Pt is a 71 y.o. female with medical history significant of HTN, DM type II, CVA, and depression. She presented with complaints of right leg swelling over the last 1 week.  Ultrasound revealed extensive clot RLE. Abdominal CT revealed right adnexal tumour with carcinomatosis. Abdominal XRAY-bowel gas pattern is consistent with small bowel ileus or partial mid to distal small bowel obstruction.    PT Comments    Patient requires encouragement to participate in mobility. Declined using RW today and adamant about using SPC despite imbalance. Requires close Min guard assist for safety and reaching for rail in hallway for BUE support. Reports not walking much at home. Fatigues very quickly and self limits activity. Encouraged increasing activity level and importance of mobility while in hospital. Will plan for stair training next session as tolerated.  Follow Up Recommendations  Supervision - Intermittent;Home health PT (pt declining follow up PT services)     Equipment Recommendations  None recommended by PT    Recommendations for Other Services       Precautions / Restrictions Precautions Precautions: Fall Restrictions Weight Bearing Restrictions: No    Mobility  Bed Mobility   Bed Mobility: Rolling;Sidelying to Sit;Sit to Supine Rolling: Modified independent (Device/Increase time) Sidelying to sit: Supervision;HOB elevated   Sit to supine: HOB elevated;Supervision   General bed mobility comments: Use of rail and increased time to get to EOB. No dizziness. Able to get into bed without assist.   Transfers Overall transfer level: Needs assistance Equipment used: Straight cane Transfers: Sit to/from Stand Sit to Stand: Min guard         General transfer comment: Min guard for safety. Declined using  RW.  Ambulation/Gait Ambulation/Gait assistance: Min guard Ambulation Distance (Feet): 120 Feet Assistive device: Straight cane Gait Pattern/deviations: Drifts right/left;Step-through pattern;Decreased stride length;Staggering left;Staggering right Gait velocity: decreased   General Gait Details: Slow, unsteady gait using SPC. Fatigues quickly. Reaching for rail to have BUE support during gait. VSS. Close Min guard assist for safety.    Stairs            Wheelchair Mobility    Modified Rankin (Stroke Patients Only)       Balance Overall balance assessment: Needs assistance Sitting-balance support: Feet supported;No upper extremity supported Sitting balance-Leahy Scale: Good     Standing balance support: During functional activity Standing balance-Leahy Scale: Fair Standing balance comment: Requires UE support in standing using SPC                            Cognition Arousal/Alertness: Awake/alert Behavior During Therapy: WFL for tasks assessed/performed Overall Cognitive Status: Within Functional Limits for tasks assessed                                        Exercises      General Comments General comments (skin integrity, edema, etc.): Neighbor and church friend present in beginning of session.      Pertinent Vitals/Pain Pain Assessment: No/denies pain    Home Living                      Prior Function            PT Goals (current goals can  now be found in the care plan section) Progress towards PT goals: Not progressing toward goals - comment (self limiting due to fatigue.)    Frequency    Min 3X/week      PT Plan Current plan remains appropriate    Co-evaluation              AM-PAC PT "6 Clicks" Daily Activity  Outcome Measure  Difficulty turning over in bed (including adjusting bedclothes, sheets and blankets)?: None Difficulty moving from lying on back to sitting on the side of the bed? :  None Difficulty sitting down on and standing up from a chair with arms (e.g., wheelchair, bedside commode, etc,.)?: None Help needed moving to and from a bed to chair (including a wheelchair)?: None Help needed walking in hospital room?: A Little Help needed climbing 3-5 steps with a railing? : A Lot 6 Click Score: 21    End of Session Equipment Utilized During Treatment: Gait belt Activity Tolerance: Patient limited by fatigue Patient left: in bed;with call bell/phone within reach;with bed alarm set Nurse Communication: Mobility status PT Visit Diagnosis: Unsteadiness on feet (R26.81);History of falling (Z91.81);Muscle weakness (generalized) (M62.81)     Time: 8592-9244 PT Time Calculation (min) (ACUTE ONLY): 17 min  Charges:  $Therapeutic Exercise: 8-22 mins                    G Codes:       Annette Farmer, PT, DPT [phone]     Annette Farmer 11/14/2016, 1:37 PM

## 2016-11-14 NOTE — Care Management Important Message (Signed)
Important Message  Patient Details  Name: Annette Farmer MRN: [id number] Date of Birth: Apr 03, 1946   Medicare Important Message Given:  Yes    Nathen May 11/14/2016, 10:56 AM

## 2016-11-14 NOTE — Progress Notes (Signed)
ANTICOAGULATION CONSULT NOTE - Follow Up Consult  Pharmacy Consult for Heparin  Indication: DVT  Patient Measurements: Height: 5' 1.5" (156.2 cm) Weight: 161 lb (73 kg) IBW/kg (Calculated) : 48.95  Vital Signs: Temp: 98.9 F (37.2 C) (06/25 0301) Temp Source: Oral (06/25 0301) BP: 126/65 (06/25 0301) Pulse Rate: 68 (06/25 0301)  Labs:  Recent Labs  11/11/16 1231  11/12/16 0217 11/12/16 1423 11/13/16 0535 11/14/16 0249  HGB  --   < > 11.9*  --  11.9* 11.7*  HCT  --   --  37.9  --  37.6 36.2  PLT  --   --  301  --  275 285  LABPROT 12.8  --   --   --   --   --   INR 0.96  --   --   --   --   --   HEPARINUNFRC 0.20*  < > 0.30  --  0.30 0.24*  CREATININE  --   --   --  0.89  --   --   < > = values in this interval not displayed.  Estimated Creatinine Clearance: 52.9 mL/min (by C-G formula based on SCr of 0.89 mg/dL).  Assessment: Heparin for DVT, heparin level low this AM, plans to transition to Lovenox for outpatient therapy in setting of likely CA  Goal of Therapy:  Heparin level 0.3-0.7 units/ml Monitor platelets by anticoagulation protocol: Yes   Plan:  -Inc heparin to 1450 units/hr  -1300 HL  Narda Bonds 11/14/2016,4:17 AM

## 2016-11-14 NOTE — Progress Notes (Signed)
ANTICOAGULATION CONSULT NOTE - Follow Up Consult  Pharmacy Consult for Heparin  Indication: DVT  Patient Measurements: Height: 5' 1.5" (156.2 cm) Weight: 161 lb (73 kg) IBW/kg (Calculated) : 48.95  HDWt: 64.7 kg  Vital Signs: Temp: 98 F (36.7 C) (06/25 1336) Temp Source: Oral (06/25 1336) BP: 107/71 (06/25 1336) Pulse Rate: 105 (06/25 1336)  Labs:  Recent Labs  11/12/16 0217 11/12/16 1423 11/13/16 0535 11/14/16 0249 11/14/16 1244 11/14/16 1417  HGB 11.9*  --  11.9* 11.7*  --   --   HCT 37.9  --  37.6 36.2  --   --   PLT 301  --  275 285  --   --   HEPARINUNFRC 0.30  --  0.30 0.24*  --  0.29*  CREATININE  --  0.89  --   --  0.72  --     Estimated Creatinine Clearance: 58.8 mL/min (by C-G formula based on SCr of 0.72 mg/dL).  Assessment: 69 yof continuing on heparin for DVT. Heparin level slightly low at 0.29. Plans to transition to Lovenox for outpatient therapy in setting of likely CA. CBC stable. Per RN, no IV line issues, but hematuria has continued over last couple days, getting "redder" per RN but states MD is aware and wants to continue heparin for now. RN to notify if worsens.  Goal of Therapy:  Heparin level 0.3-0.7 units/ml Monitor platelets by anticoagulation protocol: Yes   Plan:  Increase heparin slightly to 1500 units/hr - will not push rate with hematuria 6h heparin level Daily heparin level/CBC Monitor for bleeding   Elicia Lamp, PharmD, BCPS Clinical Pharmacist Rx Phone # for today: [phone] After 3:30PM, please call Main Rx: [phone] 11/14/2016 3:06 PM

## 2016-11-15 ENCOUNTER — Inpatient Hospital Stay (HOSPITAL_COMMUNITY): Payer: Medicare PPO

## 2016-11-15 DIAGNOSIS — C786 Secondary malignant neoplasm of retroperitoneum and peritoneum: Secondary | ICD-10-CM

## 2016-11-15 DIAGNOSIS — C561 Malignant neoplasm of right ovary: Principal | ICD-10-CM

## 2016-11-15 DIAGNOSIS — Z515 Encounter for palliative care: Secondary | ICD-10-CM

## 2016-11-15 DIAGNOSIS — I82401 Acute embolism and thrombosis of unspecified deep veins of right lower extremity: Secondary | ICD-10-CM

## 2016-11-15 DIAGNOSIS — K56609 Unspecified intestinal obstruction, unspecified as to partial versus complete obstruction: Secondary | ICD-10-CM

## 2016-11-15 DIAGNOSIS — C799 Secondary malignant neoplasm of unspecified site: Secondary | ICD-10-CM

## 2016-11-15 DIAGNOSIS — I82411 Acute embolism and thrombosis of right femoral vein: Secondary | ICD-10-CM

## 2016-11-15 DIAGNOSIS — E43 Unspecified severe protein-calorie malnutrition: Secondary | ICD-10-CM

## 2016-11-15 DIAGNOSIS — Z7189 Other specified counseling: Secondary | ICD-10-CM

## 2016-11-15 DIAGNOSIS — K5669 Other partial intestinal obstruction: Secondary | ICD-10-CM

## 2016-11-15 LAB — CBC
HEMATOCRIT: 37.9 % (ref 36.0–46.0)
Hemoglobin: 12.1 g/dL (ref 12.0–15.0)
MCH: 28.7 pg (ref 26.0–34.0)
MCHC: 31.9 g/dL (ref 30.0–36.0)
MCV: 90 fL (ref 78.0–100.0)
PLATELETS: 309 10*3/uL (ref 150–400)
RBC: 4.21 MIL/uL (ref 3.87–5.11)
RDW: 14 % (ref 11.5–15.5)
WBC: 8.5 10*3/uL (ref 4.0–10.5)

## 2016-11-15 LAB — GLUCOSE, CAPILLARY
GLUCOSE-CAPILLARY: 87 mg/dL (ref 65–99)
GLUCOSE-CAPILLARY: 116 mg/dL — AB (ref 65–99)
Glucose-Capillary: 125 mg/dL — ABNORMAL HIGH (ref 65–99)
Glucose-Capillary: 119 mg/dL — ABNORMAL HIGH (ref 65–99)

## 2016-11-15 LAB — HEPARIN LEVEL (UNFRACTIONATED): Heparin Unfractionated: 0.36 IU/mL (ref 0.30–0.70)

## 2016-11-15 MED ORDER — GUAIFENESIN-DM 100-10 MG/5ML PO SYRP
5.0000 mL | ORAL_SOLUTION | ORAL | Status: DC | PRN
  Administered 2016-11-15: 5 mL via ORAL
  Filled 2016-11-15: qty 5

## 2016-11-15 NOTE — Care Management Note (Signed)
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-  Stay complicated by SBO                 Action/Plan: PTA pt lived at home has supportive church and friends-  per PT eval refusing [place] services- PC consulted for [place]- CM will follow for d/c plan  Expected Discharge Date:                  Expected Discharge Plan:  Home/Self Care  In-House Referral:     Discharge planning Services  CM Consult  Post Acute Care Choice:    Choice offered to:     DME Arranged:    DME Agency:     HH Arranged:    HH Agency:     Status of Service:  In process, will continue to follow  If discussed at Long Length of Stay Meetings, dates discussed:    Discharge Disposition:   Additional Comments:  Dawayne Patricia, RN 11/15/2016, 3:21 PM

## 2016-11-15 NOTE — Progress Notes (Signed)
ANTICOAGULATION CONSULT NOTE - Follow Up Consult  Pharmacy Consult for Heparin  Indication: DVT  Patient Measurements: Height: 5' 1.5" (156.2 cm) Weight: 161 lb (73 kg) IBW/kg (Calculated) : 48.95  HDWt: 64.7 kg  Vital Signs: Temp: 98.3 F (36.8 C) (06/26 0500) Temp Source: Oral (06/26 0500) BP: 117/64 (06/26 0500) Pulse Rate: 69 (06/26 0500)  Labs:  Recent Labs  11/12/16 1423  11/13/16 0535 11/14/16 0249 11/14/16 1244 11/14/16 1417 11/14/16 2153 11/15/16 0305  HGB  --   < > 11.9* 11.7*  --   --   --  12.1  HCT  --   --  37.6 36.2  --   --   --  37.9  PLT  --   --  275 285  --   --   --  309  HEPARINUNFRC  --   < > 0.30 0.24*  --  0.29* 0.34 0.36  CREATININE 0.89  --   --   --  0.72  --   --   --   < > = values in this interval not displayed.  Estimated Creatinine Clearance: 58.8 mL/min (by C-G formula based on SCr of 0.72 mg/dL).  Assessment: 18 yof with malignant SBO continuing on heparin for DVT. Plans to transition to Lovenox for outpatient therapy in setting of likely CA. CBC stable. Per RN, no IV line issues, but hematuria has continued over last couple days, getting "redder" per RN but states MD is aware and wants to continue heparin for now and monitor Hg trend. RN to notify if worsens. Hg stable 12.1, plt wnl.  Heparin level remains therapeutic at 0.36 this AM. Pt going for omental biopsy and will have IVC filter placed 6/26 with ongoing hematuria.   Goal of Therapy:  Heparin level 0.3-0.7 units/ml Monitor platelets by anticoagulation protocol: Yes   Plan:  Continue heparin gtt at 1500 units/hr Daily heparin level/CBC Monitor closely for s/sx bleeding for bleeding F/u anticoagulation plan post-IVC filter   Elicia Lamp, PharmD, BCPS Clinical Pharmacist Rx Phone # for today: [phone] After 3:30PM, please call Main Rx: #[id number] 11/15/2016 8:49 AM \

## 2016-11-15 NOTE — Progress Notes (Signed)
Patient ID: Annette Farmer, female   DOB: February 02, 1946, 71 y.o.   MRN: [id number]  [facility] Surgery Progress Note     Subjective: CC- SBO, cough Sitting up in bed. Denies any current abdominal pain, states that she just feels bloated. Tolerated clear liquids yesterday. Denies n/v. She is passing flatus. Last BM 6/23. States that she had a dry cough and heartburn last night. This improved with maalox and robitussin. Going for omental biopsy and IVC filter placement today.  Objective: Vital signs in last 24 hours: Temp:  [97.9 F (36.6 C)-98.3 F (36.8 C)] 98.3 F (36.8 C) (06/26 0500) Pulse Rate:  [69-105] 69 (06/26 0500) Resp:  [14-18] 14 (06/26 0500) BP: (93-117)/(52-71) 117/64 (06/26 0500) SpO2:  [96 %-99 %] 98 % (06/26 0500) Last BM Date: 11/14/16  Intake/Output from previous day: 06/25 0701 - 06/26 0700 In: 580 [P.O.:580] Out: 300 [Urine:300] Intake/Output this shift: No intake/output data recorded.  PE: Gen:  Alert, NAD, pleasant HEENT: EOM's intact, pupils equal  Card:  RRR, no M/G/R heard Pulm:  CTAB, no W/R/R, effort normal Abd: Soft, mild distension, few BS heard, nontender Ext:  No erythema or tenderness BUE/BLE, mild edema RLE Psych: A&Ox3   Lab Results:   Recent Labs  11/14/16 0249 11/15/16 0305  WBC 7.5 8.5  HGB 11.7* 12.1  HCT 36.2 37.9  PLT 285 309   BMET  Recent Labs  11/12/16 1423 11/14/16 1244  NA 135 135  K 3.9 4.3  CL 106 107  CO2 22 22  GLUCOSE 175* 148*  BUN 15 10  CREATININE 0.89 0.72  CALCIUM 8.0* 8.0*   PT/INR No results for input(s): LABPROT, INR in the last 72 hours. CMP     Component Value Date/Time   NA 135 11/14/2016 1244   NA 139 09/28/2016 1715   K 4.3 11/14/2016 1244   CL 107 11/14/2016 1244   CO2 22 11/14/2016 1244   GLUCOSE 148 (H) 11/14/2016 1244   BUN 10 11/14/2016 1244   BUN 18 09/28/2016 1715   CREATININE 0.72 11/14/2016 1244   CREATININE 0.82 01/27/2016 1747   CALCIUM 8.0 (L) 11/14/2016 1244    PROT 6.5 11/10/2016 1656   PROT 7.2 09/28/2016 1715   ALBUMIN 3.0 (L) 11/10/2016 1656   ALBUMIN 4.1 09/28/2016 1715   AST 24 11/10/2016 1656   ALT 12 (L) 11/10/2016 1656   ALKPHOS 123 11/10/2016 1656   BILITOT 0.8 11/10/2016 1656   BILITOT 0.6 09/28/2016 1715   GFRNONAA >60 11/14/2016 1244   GFRAA >60 11/14/2016 1244   Lipase     Component Value Date/Time   LIPASE 24 11/10/2016 1656       Studies/Results: Dg Abd Portable 1v  Result Date: 11/14/2016 CLINICAL DATA:  Small bowel obstruction, intra-abdominal ascites an malignancy. EXAM: PORTABLE ABDOMEN - 1 VIEW COMPARISON:  Abdominal and pelvic CT scan of November 10, 2016 FINDINGS: There are loops of mildly distended gas-filled small bowel present. There is stool and gas within normal caliber colon and rectum. No free extraluminal gas collections are observed. IMPRESSION: The bowel gas pattern is consistent with small bowel ileus or partial mid to distal small bowel obstruction. Electronically Signed   By: David  Martinique M.D.   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 She has had no subsequent vomiting, and she tolerated clear liquids yesterday. Abdominal xray today shows no evidence of SBO and very little gaseous distention of large or small bowel; also shows moderate stool burden in rectosigmoid colon.  Will wait on NG tube placement for now. Ok to start back on CLD after procedure and add daily miralax/colace.   LOS: 5 days    Jerrye Beavers , [facility] Surgery 11/15/2016, 7:47 AM Pager: [phone] Consults: [phone] Mon-Fri 7:00 am-4:30 pm Sat-Sun 7:00 am-11:30  am

## 2016-11-15 NOTE — Consult Note (Signed)
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 female is seen at the request of Dr. Everitt Amber regarding the patient who was newly diagnosed with primary GYN malignancy, for consideration for neoadjuvant chemotherapy This patient has poor social circumstances.  She is single without children.  She has history of diabetes, hypertension and stroke. She was seen in the primary care office around November 05, 2016 after presentation with right lower extremity edema and right hip pain after recent fall.  She has chronic right sided weakness since her history of stroke.  She claimed that her recent fall is accidental.  She had very mild reduced sensation in her right lower extremity but denies significant peripheral neuropathy from diabetes. Doppler ultrasound showed extensive bilateral lower extremity DVT.  CT angiogram was done for concern about possible pulmonary emboli.  There were no evidence of PE but CT abdomen show extensive nodular implants consistent with carcinomatosis along with pelvic mass.  She was also found to have ascites.  Prior to admission, the patient has been complaining of chronic lower abdominal swelling.  She also have sensation of early satiety.  She denies chronic nausea or new onset of constipation. She has strong family history of cancer.  Her mother had colon cancer in her 61s.  Her maternal aunt had breast cancer and one sister also had breast cancer, diagnosed in her 41s.  On 11/11/2016, she underwent ultrasound-guided paracentesis. 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 Overall findings are most consistent with GYN pathology. With extensive DVT, primary surgical debulking surgery is considered risky. Ultimately, decision  is made to consider neoadjuvant chemotherapy approach.   MEDICAL HISTORY:  Past Medical History:  Diagnosis Date  . Depression   . Diabetes mellitus without complication ([place])   . Hypertension   . Stroke [facility])     SURGICAL HISTORY: Past Surgical History:  Procedure Laterality Date  . HERNIA REPAIR    . IR PARACENTESIS  11/11/2016  . UMBILICAL HERNIA REPAIR      SOCIAL HISTORY: Social History   Social History  . Marital status: Single    Spouse name: N/A  . Number of children: N/A  . Years of education: N/A   Occupational History  . Not on file.   Social History Main Topics  . Smoking status: Never Smoker  . Smokeless tobacco: Never Used  . Alcohol use No  . Drug use: No  . Sexual activity: No   Other Topics Concern  . Not on file   Social History Narrative   Marital status: single; not dating      Children: none      Live: alone      Employment: retired in 1997; Corporate treasurer      Tobacco: never      Alcohol: drinks "a little" 3 bottles of liquor and 3 bottles of wine      Exercise: none in 2018    FAMILY HISTORY: Family History  Problem Relation Age of Onset  . Parkinson's disease Mother   . Dementia Father   . Cancer Sister 84       Breast cancer    ALLERGIES:  is allergic to penicillins.  MEDICATIONS:  Current Facility-Administered Medications  Medication Dose Route Frequency Provider Last Rate Last Dose  .  0.9 %  sodium chloride infusion   Intravenous Continuous Fuller Plan A, MD 75 mL/hr at 11/14/16 0314    . acetaminophen (TYLENOL) tablet 650 mg  650 mg Oral Q6H PRN Norval Morton, MD       Or  . acetaminophen (TYLENOL) suppository 650 mg  650 mg Rectal Q6H PRN Smith, Rondell A, MD      . albuterol (PROVENTIL) (2.5 MG/3ML) 0.083% nebulizer solution 2.5 mg  2.5 mg Nebulization Q6H PRN Smith, Rondell A, MD      . alum & mag hydroxide-simeth (MAALOX/MYLANTA) 200-200-20 MG/5ML suspension 30 mL  30 mL Oral Q4H PRN Fuller Plan A, MD    30 mL at 11/15/16 0016  . aspirin tablet 325 mg  325 mg Oral Daily Hosie Poisson, MD   325 mg at 11/15/16 1316  . gabapentin (NEURONTIN) capsule 300 mg  300 mg Oral Daily Hosie Poisson, MD   300 mg at 11/15/16 1316  . gi cocktail (Maalox,Lidocaine,Donnatal)  30 mL Oral TID PRN Hosie Poisson, MD      . guaiFENesin-dextromethorphan (ROBITUSSIN DM) 100-10 MG/5ML syrup 5 mL  5 mL Oral Q4H PRN Hosie Poisson, MD   5 mL at 11/15/16 0616  . heparin ADULT infusion 100 units/mL (25000 units/243mL sodium chloride 0.45%)  1,500 Units/hr Intravenous Continuous Romona Curls, RPH 15 mL/hr at 11/15/16 1318 1,500 Units/hr at 11/15/16 1318  . insulin aspart (novoLOG) injection 0-5 Units  0-5 Units Subcutaneous QHS Smith, Rondell A, MD      . insulin aspart (novoLOG) injection 0-9 Units  0-9 Units Subcutaneous TID WC Fuller Plan A, MD   1 Units at 11/11/16 1235  . lidocaine (XYLOCAINE) 1 % (with pres) injection    PRN Monia Sabal, PA-C   10 mL at 11/11/16 1541  . losartan (COZAAR) tablet 50 mg  50 mg Oral Daily Hosie Poisson, MD   50 mg at 11/15/16 1316  . metoprolol succinate (TOPROL-XL) 24 hr tablet 25 mg  25 mg Oral Daily Hosie Poisson, MD   25 mg at 11/15/16 1318  . morphine 2 MG/ML injection 1-2 mg  1-2 mg Intravenous Q4H PRN Hosie Poisson, MD   2 mg at 11/14/16 0935  . nystatin (MYCOSTATIN/NYSTOP) topical powder   Topical TID Fuller Plan A, MD      . ondansetron (ZOFRAN) tablet 4 mg  4 mg Oral Q6H PRN Fuller Plan A, MD       Or  . ondansetron (ZOFRAN) injection 4 mg  4 mg Intravenous Q6H PRN Fuller Plan A, MD   4 mg at 11/14/16 0748  . pantoprazole (PROTONIX) EC tablet 40 mg  40 mg Oral Q0600 Hosie Poisson, MD   40 mg at 11/15/16 0616  . promethazine (PHENERGAN) injection 12.5 mg  12.5 mg Intravenous Q6H PRN Hosie Poisson, MD      . traMADol Veatrice Bourbon) tablet 50 mg  50 mg Oral Q6H PRN Norval Morton, MD   50 mg at 11/15/16 1323    REVIEW OF SYSTEMS:   Constitutional: Denies fevers, chills  or abnormal night sweats Eyes: Denies blurriness of vision, double vision or watery eyes Ears, nose, mouth, throat, and face: Denies mucositis or sore throat Respiratory: Denies cough, dyspnea or wheezes Cardiovascular: Denies palpitation, chest discomfort  Gastrointestinal:  Denies nausea, heartburn or change in bowel habits Skin: Denies abnormal skin rashes Lymphatics: Denies new lymphadenopathy or easy bruising Neurological:Denies numbness, tingling or new weaknesses Behavioral/Psych: Mood is stable, no new changes  All other  systems were reviewed with the patient and are negative.  PHYSICAL EXAMINATION: ECOG PERFORMANCE STATUS: 2 - Symptomatic, <50% confined to bed  Vitals:   11/15/16 0500 11/15/16 1318  BP: 117/64 (!) 110/59  Pulse: 69 77  Resp: 14   Temp: 98.3 F (36.8 C) 97.8 F (36.6 C)   Filed Weights   11/11/16 0058  Weight: 161 lb (73 kg)    GENERAL:alert, no distress and comfortable.  She looks very frail and older than her stated age.   SKIN: skin color, texture, turgor are normal, no rashes or significant lesions EYES: normal, conjunctiva are pink and non-injected, sclera clear OROPHARYNX:no exudate, no erythema and lips, buccal mucosa, and tongue normal  NECK: supple, thyroid normal size, non-tender, without nodularity LYMPH:  no palpable lymphadenopathy in the cervical, axillary or inguinal LUNGS: clear to auscultation and percussion with normal breathing effort HEART: regular rate & rhythm and no murmurs with mild bilateral lower extremity edema ABDOMEN:abdomen soft, non-tender and normal bowel sounds, distended and tense with carcinomatosis and ascites Musculoskeletal:no cyanosis of digits and no clubbing  PSYCH: alert & oriented x 3 with fluent speech NEURO: She has focal right upper and lower extremity weakness.  LABORATORY DATA:  I have reviewed the data as listed Lab Results  Component Value Date   WBC 8.5 11/15/2016   HGB 12.1 11/15/2016   HCT  37.9 11/15/2016   MCV 90.0 11/15/2016   PLT 309 11/15/2016    Recent Labs  01/27/16 1747  09/28/16 1715 11/10/16 1656 11/11/16 0130 11/12/16 1423 11/14/16 1244  NA 141  --  139 135 134* 135 135  K 4.2  --  4.6 4.1 4.6 3.9 4.3  CL 107  --  99 102 101 106 107  CO2 21  --  23 23 24 22 22   GLUCOSE 127*  --  136* 150* 129* 175* 148*  BUN 18  --  18 33* 28* 15 10  CREATININE 0.82  < > 0.88 1.15* 1.12* 0.89 0.72  CALCIUM 9.0  --  9.5 8.8* 8.8* 8.0* 8.0*  GFRNONAA  --   < > 66 46* 48* >60 >60  GFRAA  --   < > 76 54* 55* >60 >60  PROT 6.3  --  7.2 6.5  --   --   --   ALBUMIN 3.8  --  4.1 3.0*  --   --   --   AST 11  --  19 24  --   --   --   ALT 9  --  11 12*  --   --   --   ALKPHOS 87  --  117 123  --   --   --   BILITOT 0.7  --  0.6 0.8  --   --   --   < > = values in this interval not displayed.  RADIOGRAPHIC STUDIES: I have personally reviewed the radiological images as listed and agreed with the findings in the report. Dg Chest 2 View  Result Date: 11/10/2016 CLINICAL DATA:  Chest pain and right lower extremity DVT EXAM: CHEST  2 VIEW COMPARISON:  Chest radiograph 08/30/2015 FINDINGS: Shallow lung inflation without focal airspace disease or pulmonary edema. No pleural effusion or pneumothorax. Unchanged cardiomediastinal contours. IMPRESSION: No active cardiopulmonary disease. Electronically Signed   By: Ulyses Jarred M.D.   On: 11/10/2016 18:25   Dg Tibia/fibula Right  Result Date: 11/05/2016 CLINICAL DATA:  Swelling and pain in the right lower extremity after  fall 1 week prior EXAM: RIGHT TIBIA AND FIBULA - 2 VIEW COMPARISON:  None. FINDINGS: Diffuse right lower extremity soft tissue swelling, most prominent lateral to the right knee. No fracture. No suspicious focal osseous lesion. No evidence of malalignment at the right knee or right ankle on these views. No radiopaque foreign body. IMPRESSION: Diffuse right lower extremity soft tissue swelling, most prominent lateral to the  right knee. No fracture. No radiopaque foreign body. Electronically Signed   By: Ilona Sorrel M.D.   On: 11/05/2016 17:38   Dg Abd 1 View  Result Date: 11/15/2016 CLINICAL DATA:  Abdominal distention, back pain, history of SP of EXAM: ABDOMEN - 1 VIEW COMPARISON:  Abdomen film of 11/14/2016 and CT abdomen pelvis of 11/10/2016 FINDINGS: Compared to the most recent KUB, there is less gaseous distention of large and small bowel. There is some gaseous distention of the stomach. No small bowel obstruction is evident. A moderate amount of feces is noted in the rectosigmoid colon. No opaque calculi are seen. The bones are unremarkable. IMPRESSION: 1. No present evidence of small-bowel obstruction. Very little gaseous distention of large or small bowel. 2. No opaque calculi . Electronically Signed   By: Ivar Drape M.D.   On: 11/15/2016 08:45   Ct Angio Chest Pe W And/or Wo Contrast  Result Date: 11/10/2016 CLINICAL DATA:  Shortness of breath, abdominal pain, right lower extremity DVT EXAM: CT ANGIOGRAPHY CHEST CT ABDOMEN AND PELVIS WITH CONTRAST TECHNIQUE: Multidetector CT imaging of the chest was performed using the standard protocol during bolus administration of intravenous contrast. Multiplanar CT image reconstructions and MIPs were obtained to evaluate the vascular anatomy. Multidetector CT imaging of the abdomen and pelvis was performed using the standard protocol during bolus administration of intravenous contrast. CONTRAST:  100 mL Isovue 370 IV COMPARISON:  Chest radiograph 12/24/2003 and 11/10/2016 FINDINGS: CTA CHEST FINDINGS Cardiovascular: Contrast injection is sufficient to demonstrate satisfactory opacification of the pulmonary arteries to the segmental level. There is no pulmonary embolus. The main pulmonary artery is within normal limits for size. There is no CT evidence of acute right heart strain. There is calcific aortic atherosclerosis. There are multifocal coronary artery calcifications. There  is a normal 3-vessel arch branching pattern. Heart size is normal, without pericardial effusion. Mediastinum/Nodes: No mediastinal, hilar or axillary lymphadenopathy. The visualized thyroid and thoracic esophageal course are unremarkable. Lungs/Pleura: No pulmonary nodules or masses. No pleural effusion or pneumothorax. No focal airspace consolidation. No focal pleural abnormality. Musculoskeletal: No chest wall abnormality. No acute or significant osseous findings. Review of the MIP images confirms the above findings. CT ABDOMEN and PELVIS FINDINGS Hepatobiliary: Moderate volume perihepatic ascites. No focal liver lesion. The gallbladder is filled with stones. Pancreas: Normal pancreatic contours and enhancement. No peripancreatic fluid collection or pancreatic ductal dilatation. Spleen: Medium volume perisplenic ascites. There is peritoneal nodularity particularly in the left upper quadrant and lower midline abdomen. Adrenals/Urinary Tract: Normal adrenal glands. No hydronephrosis or solid renal mass. Stomach/Bowel: There is no hiatal hernia. The stomach and duodenum are normal. There is no dilated small bowel or enteric inflammation. There is no colonic abnormality. Nonvisualization of the appendix. Vascular/Lymphatic: There is atherosclerotic calcification of the non aneurysmal abdominal aorta. Aortocaval lymph nodes measure up to 8 mm. Reproductive: There is a multilobulated mass of the right pelvis. It is unclear whether it arises from the uterine fundus to the right adnexa. There is a left ovarian cyst measuring 4 cm. Musculoskeletal: No lytic or blastic osseous lesion. Normal  visualized extrathoracic and extraperitoneal soft tissues. Other: No contributory non-categorized findings. IMPRESSION: 1. No pulmonary embolus. 2. Extensive anterior peritoneal nodular implants, consistent with carcinomatosis. Perihepatic and perisplenic ascites is likely malignant. 3. Lobulated right pelvic mass arising from either  the right adnexa or the uterine fundus. This may be the primary malignancy. 4. Gallbladder filled stones but no other evidence of acute cholecystitis. 5. Multiple subcentimeter retroperitoneal nodes. In this context, these could indicate lymphatic spread of disease. Electronically Signed   By: Ulyses Jarred M.D.   On: 11/10/2016 19:29   Ct Abdomen Pelvis W Contrast  Result Date: 11/10/2016 CLINICAL DATA:  Shortness of breath, abdominal pain, right lower extremity DVT EXAM: CT ANGIOGRAPHY CHEST CT ABDOMEN AND PELVIS WITH CONTRAST TECHNIQUE: Multidetector CT imaging of the chest was performed using the standard protocol during bolus administration of intravenous contrast. Multiplanar CT image reconstructions and MIPs were obtained to evaluate the vascular anatomy. Multidetector CT imaging of the abdomen and pelvis was performed using the standard protocol during bolus administration of intravenous contrast. CONTRAST:  100 mL Isovue 370 IV COMPARISON:  Chest radiograph 12/24/2003 and 11/10/2016 FINDINGS: CTA CHEST FINDINGS Cardiovascular: Contrast injection is sufficient to demonstrate satisfactory opacification of the pulmonary arteries to the segmental level. There is no pulmonary embolus. The main pulmonary artery is within normal limits for size. There is no CT evidence of acute right heart strain. There is calcific aortic atherosclerosis. There are multifocal coronary artery calcifications. There is a normal 3-vessel arch branching pattern. Heart size is normal, without pericardial effusion. Mediastinum/Nodes: No mediastinal, hilar or axillary lymphadenopathy. The visualized thyroid and thoracic esophageal course are unremarkable. Lungs/Pleura: No pulmonary nodules or masses. No pleural effusion or pneumothorax. No focal airspace consolidation. No focal pleural abnormality. Musculoskeletal: No chest wall abnormality. No acute or significant osseous findings. Review of the MIP images confirms the above  findings. CT ABDOMEN and PELVIS FINDINGS Hepatobiliary: Moderate volume perihepatic ascites. No focal liver lesion. The gallbladder is filled with stones. Pancreas: Normal pancreatic contours and enhancement. No peripancreatic fluid collection or pancreatic ductal dilatation. Spleen: Medium volume perisplenic ascites. There is peritoneal nodularity particularly in the left upper quadrant and lower midline abdomen. Adrenals/Urinary Tract: Normal adrenal glands. No hydronephrosis or solid renal mass. Stomach/Bowel: There is no hiatal hernia. The stomach and duodenum are normal. There is no dilated small bowel or enteric inflammation. There is no colonic abnormality. Nonvisualization of the appendix. Vascular/Lymphatic: There is atherosclerotic calcification of the non aneurysmal abdominal aorta. Aortocaval lymph nodes measure up to 8 mm. Reproductive: There is a multilobulated mass of the right pelvis. It is unclear whether it arises from the uterine fundus to the right adnexa. There is a left ovarian cyst measuring 4 cm. Musculoskeletal: No lytic or blastic osseous lesion. Normal visualized extrathoracic and extraperitoneal soft tissues. Other: No contributory non-categorized findings. IMPRESSION: 1. No pulmonary embolus. 2. Extensive anterior peritoneal nodular implants, consistent with carcinomatosis. Perihepatic and perisplenic ascites is likely malignant. 3. Lobulated right pelvic mass arising from either the right adnexa or the uterine fundus. This may be the primary malignancy. 4. Gallbladder filled stones but no other evidence of acute cholecystitis. 5. Multiple subcentimeter retroperitoneal nodes. In this context, these could indicate lymphatic spread of disease. Electronically Signed   By: Ulyses Jarred M.D.   On: 11/10/2016 19:29   Dg Abd Portable 1v  Result Date: 11/14/2016 CLINICAL DATA:  Small bowel obstruction, intra-abdominal ascites an malignancy. EXAM: PORTABLE ABDOMEN - 1 VIEW COMPARISON:   Abdominal and pelvic  CT scan of November 10, 2016 FINDINGS: There are loops of mildly distended gas-filled small bowel present. There is stool and gas within normal caliber colon and rectum. No free extraluminal gas collections are observed. IMPRESSION: The bowel gas pattern is consistent with small bowel ileus or partial mid to distal small bowel obstruction. Electronically Signed   By: David  Martinique M.D.   On: 11/14/2016 11:43   Dg Hip Unilat W Or W/o Pelvis 2-3 Views Right  Result Date: 11/03/2016 CLINICAL DATA:  Patient fell 6 days ago. Swollen right leg with hip pain. EXAM: DG HIP (WITH OR WITHOUT PELVIS) 2-3V RIGHT COMPARISON:  Abdomen films from 12/24/2003. FINDINGS: There is no evidence of hip fracture or dislocation. There is no evidence of arthropathy or other focal bone abnormality. IMPRESSION: Negative. Electronically Signed   By: Misty Stanley M.D.   On: 11/03/2016 18:00   Ir Paracentesis  Result Date: 11/11/2016 INDICATION: Probable carcinomatosis ascites EXAM: ULTRASOUND-GUIDED PARACENTESIS COMPARISON:  None. MEDICATIONS: 10 cc 1% lidocaine. COMPLICATIONS: None immediate. TECHNIQUE: Informed written consent was obtained from the patient after a discussion of the risks, benefits and alternatives to treatment. A timeout was performed prior to the initiation of the procedure. Initial ultrasound scanning demonstrates a small amount of ascites within the right upper abdominal quadrant. The right upper abdomen was prepped and draped in the usual sterile fashion. 1% lidocaine with epinephrine was used for local anesthesia. Under direct ultrasound guidance, a 19 gauge, 7-cm, Yueh catheter was introduced. An ultrasound image was saved for documentation purposed. The paracentesis was performed. The catheter was removed and a dressing was applied. The patient tolerated the procedure well without immediate post procedural complication. FINDINGS: A total of approximately 420 cc of dark yellow fluid was  removed. Samples were sent to the laboratory as requested by the clinical team. IMPRESSION: Successful ultrasound-guided paracentesis yielding 420 cc of peritoneal fluid. Read by Lavonia Drafts [facility] Electronically Signed   By: Jacqulynn Cadet M.D.   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  However, she acknowledged that without treatment she will unlikely get better. I am very concerned about her poor social situation and her poor understanding of disease process. She also have signs and symptoms of mild subacute chronic gastrointestinal obstructive symptoms which could also be related to her disease process I have discussed with primary service  She will need port placement as soon as possible I will discuss with the service to consider transferring her to [facility] to start first cycle of treatment and close monitoring for side effects and her blood sugar afterwards prior to discharge  Mild GI obstructive symptoms She is able to tolerate some clear liquid albeit with some nausea symptoms during exam Continue conservative approach She would benefit from inpatient chemotherapy as soon as possible  Extensive DVT Due to untreated cancer I do not feel it is necessary for IVC filter placement Consider transition to Lovenox/NOAC upon discharge  Strong family history of cancer I would refer her to see genetic counselor after discharge  Diabetes and history of stroke The patient would be at high risk of severe hyperglycemia after treatment I recommend first dose of chemo to be  given in the inpatient setting with close  monitoring of her blood sugar afterwards  Poor social circumstances  The patient is very adamant she would not go to skilled nursing facility She claims to have close friends at church who would be willing to provide transportation back and forth to the cancer center for treatment  Goals of care Her disease is highly treatable, potentially curable with aggressive management The patient has living will at home We discussed CODE STATUS and she desired to remain full code for now  Discharge planning She will get port placement soon I will discuss with primary service to consider transferring her to [facility] long hospital to begin cycle 1 of treatment tomorrow if possible This is not a situation where she can wait for outpatient treatment due to her GI symptoms  All questions were answered. The patient knows to call the clinic with any problems, questions or concerns.    Heath Lark, MD 11/15/2016 4:20 PM

## 2016-11-15 NOTE — Progress Notes (Signed)
PROGRESS NOTE    Annette Farmer  YWV:[id number] DOB: 1946/04/29 DOA: 11/10/2016 PCP: Annette Honour, MD    Brief Narrative:Annette Farmer is a 71 y.o. female with medical history significant of HTN, DM type II, CVA, and depression;  who presents with complaints of right leg swelling over the last 1 week Doppler ultrasound of the bilateral lower extremities revealed a extensive clot of the right saphenofemoral junction, common femoral, profunda, femoral, popliteal, posterior tibial, and peroneal veins.   Meanwhile CT abd and chest shows right adnexal tumour with carcinomatosis.  Consulted gyn oncologist recommended tumour markers and an omental biopsy which is scheduled to be done by IR if the ascitic fluid cytology comes negative.  US paracentesis ordered for diagnostic purposes. Ascitic fluid sent for cytology.  Meanwhile in view of her extensive DVT of the right LE, vascular surgery consulted to see if she needs thrombectomy. No thrombolytics or IVC unless she cant be on anti coagulation, plan to change to lovenox on discharge.   On 6/25, pt developed gross hematuria, became nauseated , and vomited feculent looking stuff.  ABD film shows partial sbo possibly from the carcinomatosis. Surgery consulted for recommendations. Conservative management as she is okay on clears and no vomiting since yesterday.  IR re consulted to see if she needs IVC filter in view of the gross hematuria, we will hold off the IVC filter for now, as her hemoglobin seems to have stabilized.   Dr Alvy Bimler will see the patient later today for neo adjuvant chemotherapy, and she will probably need to start right away inpatient.   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  No PE on CT angiogram.  Vascular surgery consulted to see if she needs thrombectomy, for now to continue with IV heparin, possibly transition to lovenox sq on discharge.  Gross hematuria the last two days hemoglobin stable between 11 to 12. Requested IR to see if she needs IVC filter, incase she continues to bleed.      Right pelvis mass possibly from the right adnexa:  Gyn Onc Consultation requested.  Recommendations given.  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  CA 125 elevated at 967 and CA 19 at 71. CEA level is negative. Discussed the results with the patient. Offered to talk to the family.      AKI:  Suspect from dehydration/ pre renal azotemia.  Get urine electrolytes.  Hydrate and repeat renal parameters show much improvement, back to baseline.   Holding nephrotoxic drugs.    Hypertension; Stop HCTZ.  Controlled.   Diabetes mellitus:  CBG (last 3)   Recent Labs  11/14/16 2216 11/15/16 0609 11/15/16 1123  GLUCAP 119* 125* 119*    Resume SSI. No change in meds.    Right hip soreness:  Possibly from the right pelvic mass, pain control.    GERD: requesting reflux medications. protonix and maalox added.   Partial SBO; secondary to malignancy. Clear liquidd diet and if she starts vomiting , will need NG tube to  be placed.  Surgery consulted and recommendations given. Conservative management for now. Repeat ABD x ray this am does not show any bowel obstruction. She is able to pass flatulence. No BM yet.    In view of her grave prognosis, and worsening status, and her lack of family support, requested palliative care consult for goals of care discussion and possibly  symptom management.    DVT prophylaxis: heparin.  Code Status: full  code.  Family Communication: none at bedside.  Disposition Plan: pending further evaluation. Not stable to be discharged home.    Consultants:   IR Dr Annamaria Boots  GYN ONCOLOGY Dr Delsa Sale.  Vascular surgery. Dr Donzetta Matters  Surgery   Oncology Dr Alvy Bimler   Procedures:    IR Paracentesis     Antimicrobials: none.    Subjective:   NO VOMITING, nauseated.  No fever or chills.  No chest pain or sob.  Able to pass flatulence, no BM yet.   Objective: Vitals:   11/14/16 1336 11/14/16 2220 11/15/16 0500 11/15/16 1318  BP: 107/71 (!) 93/52 117/64 (!) 110/59  Pulse: (!) 105 71 69 77  Resp: 17 18 14    Temp: 98 F (36.7 C) 97.9 F (36.6 C) 98.3 F (36.8 C) 97.8 F (36.6 C)  TempSrc: Oral Oral Oral   SpO2: 96% 99% 98% 98%  Weight:      Height:        Intake/Output Summary (Last 24 hours) at 11/15/16 1620 Last data filed at 11/15/16 1300  Gross per 24 hour  Intake              480 ml  Output                0 ml  Net              480 ml   Filed Weights   11/11/16 0058  Weight: 73 kg (161 lb)    Examination:  General exam: in mild distress from nausea  Respiratory system: diminished at bases, no wheezing or rhonchi Cardiovascular system: s1s2, RRR, pedal edema.  Gastrointestinal system: abd is soft tender generalized, moderate. Distended. Bowel sounds hypoactive.  Central nervous system: alert and oriented to place and person. Non focal.  Extremities: bilateral pedal edema. No cyanosis.  Skin: No rashes, lesions or ulcers Psychiatry:  Flat affect.     Data Reviewed: I have personally reviewed following labs and imaging studies  CBC:  Recent Labs Lab 11/10/16 1656 11/11/16 0130 11/12/16 0217 11/13/16 0535 11/14/16 0249 11/15/16 0305  WBC 8.1 7.5 6.9 7.7 7.5 8.5  NEUTROABS 6.8  --   --   --   --   --   HGB 12.4 13.2 11.9* 11.9* 11.7* 12.1  HCT 37.4 40.4 37.9 37.6 36.2 37.9  MCV 89.3 90.8 90.9 91.5 90.0 90.0  PLT 345 298 301 275 285 401   Basic  Metabolic Panel:  Recent Labs Lab 11/10/16 1656 11/11/16 0130 11/12/16 1423 11/14/16 1244  NA 135 134* 135 135  K 4.1 4.6 3.9 4.3  CL 102 101 106 107  CO2 23 24 22 22   GLUCOSE 150* 129* 175* 148*  BUN 33* 28* 15 10  CREATININE 1.15* 1.12* 0.89 0.72  CALCIUM 8.8* 8.8* 8.0* 8.0*   GFR: Estimated Creatinine Clearance: 58.8 mL/min (by C-G formula based on SCr of 0.72 mg/dL). Liver Function Tests:  Recent Labs Lab 11/10/16 1656  AST 24  ALT 12*  ALKPHOS 123  BILITOT 0.8  PROT 6.5  ALBUMIN 3.0*    Recent Labs Lab 11/10/16 1656  LIPASE 24   No results for input(s): AMMONIA in the last 168 hours. Coagulation Profile:  Recent Labs Lab 11/11/16 1231  INR 0.96   Cardiac Enzymes: No results for input(s): CKTOTAL, CKMB, CKMBINDEX, TROPONINI in the last 168 hours. BNP (last 3 results) No results for input(s): PROBNP in the last 8760 hours. HbA1C: No results for input(s): HGBA1C in the last 72 hours. CBG:  Recent Labs Lab 11/14/16 1107 11/14/16 1644 11/14/16 2216 11/15/16 0609 11/15/16 1123  GLUCAP 124* 117* 119* 125* 119*   Lipid Profile: No results for input(s): CHOL, HDL, LDLCALC, TRIG, CHOLHDL, LDLDIRECT in the last 72 hours. Thyroid Function Tests: No results for input(s): TSH, T4TOTAL, FREET4, T3FREE, THYROIDAB in the last 72 hours. Anemia Panel: No results for input(s): VITAMINB12, FOLATE, FERRITIN, TIBC, IRON, RETICCTPCT in the last 72 hours. Sepsis Labs: No results for input(s): PROCALCITON, LATICACIDVEN in the last 168 hours.  Recent Results (from the past 240 hour(s))  Culture, body fluid-bottle     Status: None (Preliminary result)   Collection Time: 11/11/16  3:51 PM  Result Value Ref Range Status   Specimen Description FLUID PERITONEAL  Final   Special Requests BOTTLES DRAWN AEROBIC AND ANAEROBIC 10CC  Final   Culture NO GROWTH 4 DAYS  Final   Report Status PENDING  Incomplete  Gram stain     Status: None   Collection Time: 11/11/16  3:51  PM  Result Value Ref Range Status   Specimen Description FLUID PERITONEAL  Final   Special Requests NONE  Final   Gram Stain   Final    MODERATE WBC PRESENT,BOTH PMN AND MONONUCLEAR NO ORGANISMS SEEN    Report Status 11/12/2016 FINAL  Final         Radiology Studies: Dg Abd 1 View  Result Date: 11/15/2016 CLINICAL DATA:  Abdominal distention, back pain, history of SP of EXAM: ABDOMEN - 1 VIEW COMPARISON:  Abdomen film of 11/14/2016 and CT abdomen pelvis of 11/10/2016 FINDINGS: Compared to the most recent KUB, there is less gaseous distention of large and small bowel. There is some gaseous distention of the stomach. No small bowel obstruction is evident. A moderate amount of feces is noted in the rectosigmoid colon. No opaque calculi are seen. The bones are unremarkable. IMPRESSION: 1. No present evidence of small-bowel obstruction. Very little gaseous distention of large or small bowel. 2. No opaque calculi . Electronically Signed   By: Ivar Drape M.D.   On: 11/15/2016 08:45   Dg Abd Portable 1v  Result Date: 11/14/2016 CLINICAL DATA:  Small bowel obstruction, intra-abdominal ascites an malignancy. EXAM: PORTABLE ABDOMEN - 1 VIEW COMPARISON:  Abdominal and pelvic CT scan of November 10, 2016 FINDINGS: There are loops of mildly distended gas-filled small bowel present. There is stool and gas within normal caliber colon and rectum. No free extraluminal gas collections are observed. IMPRESSION: The bowel gas pattern is consistent with small bowel ileus or partial mid to distal small bowel obstruction. Electronically Signed   By: David  Martinique M.D.   On: 11/14/2016 11:43        Scheduled Meds: . aspirin  325 mg Oral Daily  . gabapentin  300 mg Oral Daily  . insulin aspart  0-5 Units Subcutaneous QHS  . insulin aspart  0-9 Units Subcutaneous TID WC  . losartan  50 mg Oral Daily  . metoprolol succinate  25 mg Oral Daily  . nystatin  Topical TID  . pantoprazole  40 mg Oral Q0600    Continuous Infusions: . sodium chloride 75 mL/hr at 11/14/16 0314  . heparin 1,500 Units/hr (11/15/16 1318)     LOS: 5 days    Time spent: 45 minutes.     Annette Poisson, MD [facility] Pager [phone]  If 7PM-7AM, please contact night-coverage [email or web address] Password [email or web address] 11/15/2016, 4:20 PM

## 2016-11-15 NOTE — Consult Note (Addendum)
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  I discussed with the patient that survival is equivalent with either neoadjuvant chemotherapy and primary debulking, however neoadjuvant chemotherapy is associated with decreased perioperative morbidity.  - Recommend 3 cycles carboplatin and paclitaxel (may need adjusted dosing/schedule pending her tolerance). Dr Alvy Bimler has met with the patient and will explore options for inpatient chemotherapy in order to expedite therapy.  - Needs portacath for chemotherapy.  - after portacath placed, would consider transitioning to alternative long-term anticoagulant therapy  - GI symptoms - consistent with tumor ileus and not SBO. Surgery not recommended in this setting (in the acute phase). Ileus symptoms will likely resolve with chemotherapy.  HPI Annette Farmer is an 71 y.o. female.  HPI: Asked to consult on this patient with a newly diagnosed pelvic mass and concomitant RLE DVT.  The patient recently fell and presented to her primary care provider with RLE swelling and hip pain. Doppler of the RLE showed an extensive clot of the saphenofemoral, femoral, popliteal, posterior tibial and peroneal veins.  Further evaluation included a CT scan of the abdomen/pelvis that was remarkable for a right adnexal mass, peritoneal nodules, retroperitoneal adenopathy and moderate ascites.  The patient at one point became hypotensive; a CT angiogram was negative for a PE.  There was no obvious pleural effusions or pulmonary metastases. She underwent a diagnostic paracentesis  by IR on 11/11/16 which revealed adenocarcinoma favor gyn primary. She endorses anorexia, reflux symptoms, increasing abdominal girth and a 10 lb weight loss over the last few months. She denies any changes in her bladder habits.  There is a family history of breast and colon cancer.   Past Medical History:  Diagnosis Date  . Depression   . Diabetes mellitus without complication ([place])   . Hypertension   . Stroke [facility])     Past Surgical History:  Procedure Laterality Date  . HERNIA REPAIR    . IR PARACENTESIS  11/11/2016  . UMBILICAL HERNIA REPAIR      Family History  Problem Relation Age of Onset  . Parkinson's disease Mother   . Dementia Father   . Cancer Sister 62       Breast cancer    Social History:  reports that she has never smoked. She has never used smokeless tobacco. She reports that she does not drink alcohol or use drugs.  Allergies:  Allergies  Allergen Reactions  . Penicillins Nausea Only    Has patient had a PCN reaction causing immediate rash, facial/tongue/throat swelling, SOB or lightheadedness with hypotension: NO Has patient had a PCN reaction causing severe rash involving mucus membranes or skin necrosis: NO Has patient had a PCN reaction that required hospitalization: NO Has patient had a PCN reaction occurring within the last 10 years: NO If all of the above answers are "NO", then may proceed with Cephalosporin use.     Medications: I have reviewed the patient's current medications.  Results for orders placed or performed during the hospital encounter of 11/10/16 (from the past 48 hour(s))  Heparin level (unfractionated)     Status: Abnormal   Collection Time: 11/14/16  2:49 AM  Result Value Ref Range   Heparin Unfractionated 0.24 (  L) 0.30 - 0.70 IU/mL    Comment:        IF HEPARIN RESULTS ARE BELOW EXPECTED VALUES, AND PATIENT DOSAGE HAS BEEN CONFIRMED, SUGGEST FOLLOW UP TESTING OF ANTITHROMBIN III LEVELS.   CBC     Status: Abnormal    Collection Time: 11/14/16  2:49 AM  Result Value Ref Range   WBC 7.5 4.0 - 10.5 K/uL   RBC 4.02 3.87 - 5.11 MIL/uL   Hemoglobin 11.7 (L) 12.0 - 15.0 g/dL   HCT 36.2 36.0 - 46.0 %   MCV 90.0 78.0 - 100.0 fL   MCH 29.1 26.0 - 34.0 pg   MCHC 32.3 30.0 - 36.0 g/dL   RDW 13.8 11.5 - 15.5 %   Platelets 285 150 - 400 K/uL  Glucose, capillary     Status: Abnormal   Collection Time: 11/14/16  5:58 AM  Result Value Ref Range   Glucose-Capillary 100 (H) 65 - 99 mg/dL  Glucose, capillary     Status: Abnormal   Collection Time: 11/14/16 11:07 AM  Result Value Ref Range   Glucose-Capillary 124 (H) 65 - 99 mg/dL   Comment 1 Notify RN   Basic metabolic panel     Status: Abnormal   Collection Time: 11/14/16 12:44 PM  Result Value Ref Range   Sodium 135 135 - 145 mmol/L   Potassium 4.3 3.5 - 5.1 mmol/L   Chloride 107 101 - 111 mmol/L   CO2 22 22 - 32 mmol/L   Glucose, Bld 148 (H) 65 - 99 mg/dL   BUN 10 6 - 20 mg/dL   Creatinine, Ser 0.72 0.44 - 1.00 mg/dL   Calcium 8.0 (L) 8.9 - 10.3 mg/dL   GFR calc non Af Amer >60 >60 mL/min   GFR calc Af Amer >60 >60 mL/min    Comment: (NOTE) The eGFR has been calculated using the CKD EPI equation. This calculation has not been validated in all clinical situations. eGFR's persistently <60 mL/min signify possible Chronic Kidney Disease.    Anion gap 6 5 - 15  Heparin level (unfractionated)     Status: Abnormal   Collection Time: 11/14/16  2:17 PM  Result Value Ref Range   Heparin Unfractionated 0.29 (L) 0.30 - 0.70 IU/mL    Comment:        IF HEPARIN RESULTS ARE BELOW EXPECTED VALUES, AND PATIENT DOSAGE HAS BEEN CONFIRMED, SUGGEST FOLLOW UP TESTING OF ANTITHROMBIN III LEVELS.   Glucose, capillary     Status: Abnormal   Collection Time: 11/14/16  4:44 PM  Result Value Ref Range   Glucose-Capillary 117 (H) 65 - 99 mg/dL   Comment 1 Notify RN    Comment 2 Document in Chart   Heparin level (unfractionated)     Status: None   Collection Time:  11/14/16  9:53 PM  Result Value Ref Range   Heparin Unfractionated 0.34 0.30 - 0.70 IU/mL    Comment:        IF HEPARIN RESULTS ARE BELOW EXPECTED VALUES, AND PATIENT DOSAGE HAS BEEN CONFIRMED, SUGGEST FOLLOW UP TESTING OF ANTITHROMBIN III LEVELS.   Glucose, capillary     Status: Abnormal   Collection Time: 11/14/16 10:16 PM  Result Value Ref Range   Glucose-Capillary 119 (H) 65 - 99 mg/dL   Comment 1 Notify RN    Comment 2 Document in Chart   Heparin level (unfractionated)     Status: None   Collection Time: 11/15/16  3:05 AM  Result Value Ref Range  Heparin Unfractionated 0.36 0.30 - 0.70 IU/mL    Comment:        IF HEPARIN RESULTS ARE BELOW EXPECTED VALUES, AND PATIENT DOSAGE HAS BEEN CONFIRMED, SUGGEST FOLLOW UP TESTING OF ANTITHROMBIN III LEVELS.   CBC     Status: None   Collection Time: 11/15/16  3:05 AM  Result Value Ref Range   WBC 8.5 4.0 - 10.5 K/uL   RBC 4.21 3.87 - 5.11 MIL/uL   Hemoglobin 12.1 12.0 - 15.0 g/dL   HCT 37.9 36.0 - 46.0 %   MCV 90.0 78.0 - 100.0 fL   MCH 28.7 26.0 - 34.0 pg   MCHC 31.9 30.0 - 36.0 g/dL   RDW 14.0 11.5 - 15.5 %   Platelets 309 150 - 400 K/uL  Glucose, capillary     Status: Abnormal   Collection Time: 11/15/16  6:09 AM  Result Value Ref Range   Glucose-Capillary 125 (H) 65 - 99 mg/dL  Glucose, capillary     Status: Abnormal   Collection Time: 11/15/16 11:23 AM  Result Value Ref Range   Glucose-Capillary 119 (H) 65 - 99 mg/dL   Comment 1 Notify RN   Glucose, capillary     Status: None   Collection Time: 11/15/16  4:45 PM  Result Value Ref Range   Glucose-Capillary 87 65 - 99 mg/dL   Comment 1 Notify RN    Comment 2 Document in Chart   Glucose, capillary     Status: Abnormal   Collection Time: 11/15/16  9:31 PM  Result Value Ref Range   Glucose-Capillary 116 (H) 65 - 99 mg/dL   Comment 1 Notify RN    Comment 2 Document in Chart     Dg Abd 1 View  Result Date: 11/15/2016 CLINICAL DATA:  Abdominal distention, back  pain, history of SP of EXAM: ABDOMEN - 1 VIEW COMPARISON:  Abdomen film of 11/14/2016 and CT abdomen pelvis of 11/10/2016 FINDINGS: Compared to the most recent KUB, there is less gaseous distention of large and small bowel. There is some gaseous distention of the stomach. No small bowel obstruction is evident. A moderate amount of feces is noted in the rectosigmoid colon. No opaque calculi are seen. The bones are unremarkable. IMPRESSION: 1. No present evidence of small-bowel obstruction. Very little gaseous distention of large or small bowel. 2. No opaque calculi . Electronically Signed   By: Ivar Drape M.D.   On: 11/15/2016 08:45   Dg Abd Portable 1v  Result Date: 11/14/2016 CLINICAL DATA:  Small bowel obstruction, intra-abdominal ascites an malignancy. EXAM: PORTABLE ABDOMEN - 1 VIEW COMPARISON:  Abdominal and pelvic CT scan of November 10, 2016 FINDINGS: There are loops of mildly distended gas-filled small bowel present. There is stool and gas within normal caliber colon and rectum. No free extraluminal gas collections are observed. IMPRESSION: The bowel gas pattern is consistent with small bowel ileus or partial mid to distal small bowel obstruction. Electronically Signed   By: David  Martinique M.D.   On: 11/14/2016 11:43    Review of Systems  Constitutional: Positive for weight loss.  Gastrointestinal: Positive for heartburn. Negative for abdominal pain, constipation, diarrhea, nausea and vomiting.  Genitourinary: Negative for dysuria, frequency, hematuria and urgency.   Blood pressure (!) 128/53, pulse 60, temperature 97.7 F (36.5 C), temperature source Oral, resp. rate 18, height 5' 1.5" (1.562 m), weight 161 lb (73 kg), SpO2 100 %. Physical Exam  Constitutional: She is oriented to person, place, and time. She appears well-developed.  Appears older than stated age  GI: Soft. She exhibits distension and mass. There is no tenderness.  Genitourinary:  Genitourinary Comments: Deferred   Musculoskeletal: She exhibits edema (R>>>L).  Neurological: She is alert and oriented to person, place, and time.  Skin: Skin is warm. There is pallor.  Psychiatric: She has a normal mood and affect.     Donaciano Eva 11/15/2016, 9:40 PM   Cell [phone]

## 2016-11-15 NOTE — Progress Notes (Signed)
Patient ID: Annette Farmer, female   DOB: 04-23-1946, 71 y.o.   MRN: [id number]   I have seen and again discussed both IVC filter placement procedure  And Omental mass biopsy procedure  She has consented for both Consents in chart.  Risks and Benefits discussed with the patient including, but not limited to bleeding, infection, contrast induced renal failure, filter fracture or migration which can lead to emergency surgery or even death, strut penetration with damage or irritation to adjacent structures and caval thrombosis. All of the patient's questions were answered, patient is agreeable to proceed. Consent signed and in chart.  Risks and Benefits discussed with the patient including, but not limited to bleeding, infection, damage to adjacent structures or low yield requiring additional tests. All of the patient's questions were answered, patient is agreeable to proceed. Consent signed and in chart.

## 2016-11-15 NOTE — Consult Note (Signed)
Consultation Note Date: 11/15/2016   Patient Name: Annette Farmer  DOB: 02/07/1946  MRN: [id number]  Age / Sex: 71 y.o., female  PCP: Wardell Honour, MD Referring Physician: Hosie Poisson, MD  Reason for Consultation: Establishing goals of care  HPI/Patient Profile: 71 y.o. female  with past medical history of CVA, DM, depression and hypertension who was admitted on 11/10/2016 with right lower extremity swelling.  She had been seen outpatient by [facility] who found an extensive DVT on doppler u/s.  They advised her to go to the ER.  Initial work up included a CT abdomen pelvis that showed a right adnexal mass and carcinomatosis.  Paracentesis was performed.  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  Tumor markers revealed a CA-125 of 967.  Her stay has been complicated by a partial SBO that is thought to be caused by tumor.  Oncology consult is pending.  Clinical Assessment and Goals of Care:  I have reviewed medical records including EPIC notes, labs and imaging, received report from the attending physician, assessed the patient at bedside.  Afterward I spoke on the phone with her friend, Harless Nakayama to gain further insight.  I introduced Palliative Medicine as specialized medical care for people living with serious illness. It focuses on providing relief from the symptoms and stress of a serious illness. The goal is to improve quality of life for both the patient and the family.  We discussed a brief life review of the patient.  She is a self described "Hippie" who enjoys jewel encrusted clothing and bright colors.  She belongs to the [facility] and seems to get support from her girlfriends and the church.  She has lived alone for 43 years and can not imagine living any other way.  Ivianna has a brother and  Father living in [place] does not interact with them much.  They  know she is in the hospital but [facility]  I we discussed her diagnosis of cancer.  She told me she did not agree with having a biopsy and wasn't comfortable with the idea.  She seemed opposed to aggressive medical interventions.  We talked about pursuing aggressive medical care vs comfort care and I suggested that no matter which path she decides to take - she is going to need people to support her and care for her thru this illness.  Natalie covered her face.  She did not want to think about having to depend on other to help her.    We discussed HCPOA she could not think of who she had named HCPOA previously.  I asked her to consider who she would want that to be.   She seemed a bit overwhelmed so I told her I would visit again tomorrow.  I spoke with Harless Nakayama.  She described Anylah as a Ship broker.  She stated that she supported Dauna thru the stroke and it was very difficult.  Estill Bamberg  is unable to be in Daney's home due to the conditions.  Estill Bamberg tells me that Auriah will likely NOT want medical intervention for the cancer and she will not want to go to acute rehab at a SNF.  She has been to SNF previously after her CVA and did not have a good experience. Per Zadie Cleverly is not close to her father or brother.  Estill Bamberg provided my with Jacqualyn's brother's phone number (unfortunately it was the wrong number).  Estill Bamberg and I agreed to meet with Gay Filler tomorrow (6/27) in the late afternoon after Oncology visits to make further decisions.  Primary Decision Maker:  PATIENT    SUMMARY OF RECOMMENDATIONS   PMT will attempt a GOC tomorrow afternoon with Gay Filler and Estill Bamberg as well as another good friend Philis Fendt.   Code Status/Advance Care Planning:  Full code    Symptom Management:   Per primary team.  Currently denies pain and shortness of breath   Psycho-social/Spiritual:   Desire for further Chaplaincy support: Yes  requested  Prognosis:   Unable to determine - will wait for Oncology input/recs  Discharge Planning: To Be Determined      Primary Diagnoses: Present on Admission: . DVT (deep venous thrombosis) ([place]) . HYPERTENSION, BENIGN SYSTEMIC . AKI (acute kidney injury) ([place]) . Pelvic mass . 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 The following aspects are pertinent.  Past Medical History:  Diagnosis Date  . Depression   . Diabetes mellitus without complication ([place])   . Hypertension   . Stroke [facility])    Social History   Social History  . Marital status: Single    Spouse name: N/A  . Number of children: N/A  . Years of education: N/A   Social History Main Topics  . Smoking status: Never Smoker  . Smokeless tobacco: Never Used  . Alcohol use No  . Drug use: No  . Sexual activity: No   Other Topics Concern  . None   Social History Narrative   Marital status: single; not dating      Children: none      Live: alone      Employment: retired in 1997; Corporate treasurer      Tobacco: never      Alcohol: drinks "a little" 3 bottles of liquor and 3 bottles of wine      Exercise: none in 2018   Family History  Problem Relation Age of Onset  . Parkinson's disease Mother   . Dementia Father   . Cancer Sister 33       Breast cancer   Scheduled Meds: . aspirin  325 mg Oral Daily  . gabapentin  300 mg Oral Daily  . insulin aspart  0-5 Units Subcutaneous QHS  . insulin aspart  0-9 Units Subcutaneous TID WC  . losartan  50 mg Oral Daily  . metoprolol succinate  25 mg Oral Daily  . nystatin   Topical TID  . pantoprazole  40 mg Oral Q0600   Continuous Infusions: . sodium chloride 75 mL/hr at 11/14/16 0314  . heparin 1,500 Units/hr (11/15/16 1318)   PRN Meds:.acetaminophen **OR** acetaminophen, albuterol, alum & mag hydroxide-simeth, gi cocktail, guaiFENesin-dextromethorphan, lidocaine,  morphine injection, ondansetron **OR** ondansetron (ZOFRAN) IV, promethazine, traMADol Allergies  Allergen Reactions  . Penicillins Nausea Only    Has patient had a PCN reaction causing immediate rash, facial/tongue/throat swelling, SOB or lightheadedness with hypotension: NO Has patient  had a PCN reaction causing severe rash involving mucus membranes or skin necrosis: NO Has patient had a PCN reaction that required hospitalization: NO Has patient had a PCN reaction occurring within the last 10 years: NO If all of the above answers are "NO", then may proceed with Cephalosporin use.    Review of Systems patient reports that she normally sleeps from 3:00 am - 3:00 pm so sleep has been very difficult in the hospital.  She denies further vomiting or nausea.  No pain or dyspnea  Physical Exam  Well developed pale appearing female (appears chronically ill) CV rrr resp no distress Abdomen distended and soft Patient A&O able to follow commands, cooperative   Vital Signs: BP (!) 110/59   Pulse 77   Temp 97.8 F (36.6 C)   Resp 14   Ht 5' 1.5" (1.562 m)   Wt 73 kg (161 lb)   SpO2 98%   BMI 29.93 kg/m  Pain Assessment: 0-10   Pain Score: 3    SpO2: SpO2: 98 % O2 Device:SpO2: 98 % O2 Flow Rate: .   IO: Intake/output summary:  Intake/Output Summary (Last 24 hours) at 11/15/16 1340 Last data filed at 11/15/16 1300  Gross per 24 hour  Intake              480 ml  Output                0 ml  Net              480 ml    LBM: Last BM Date: 11/14/16 Baseline Weight: Weight: 73 kg (161 lb) Most recent weight: Weight: 73 kg (161 lb)     Palliative Assessment/Data:   Flowsheet Rows     Most Recent Value  Intake Tab  Referral Department  Hospitalist  Unit at Time of Referral  Oncology Unit  Palliative Care Primary Diagnosis  Cancer  Date Notified  11/14/16  Palliative Care Type  New Palliative care  Reason for referral  Clarify Goals of Care  Date of Admission  11/10/16  Date  first seen by Palliative Care  11/15/16  # of days Palliative referral response time  1 Day(s)  # of days IP prior to Palliative referral  4  Clinical Assessment  Palliative Performance Scale Score  50%  Psychosocial & Spiritual Assessment  Palliative Care Outcomes  Patient/Family meeting held?  Yes  Who was at the meeting?  patient  Palliative Care Outcomes  Clarified goals of care      Time In: 9:30 Time Out: 10:40 Time Total: 70 min. Greater than 50%  of this time was spent counseling and coordinating care related to the above assessment and plan.  Signed by: Imogene Burn, PA-C Palliative Medicine Pager: [phone]  Please contact Palliative Medicine Team phone at [phone] for questions and concerns.  For individual provider: See Shea Evans

## 2016-11-16 ENCOUNTER — Other Ambulatory Visit: Payer: Self-pay | Admitting: Hematology and Oncology

## 2016-11-16 DIAGNOSIS — I82401 Acute embolism and thrombosis of unspecified deep veins of right lower extremity: Secondary | ICD-10-CM

## 2016-11-16 DIAGNOSIS — C561 Malignant neoplasm of right ovary: Secondary | ICD-10-CM

## 2016-11-16 DIAGNOSIS — K567 Ileus, unspecified: Secondary | ICD-10-CM | POA: Clinically undetermined

## 2016-11-16 DIAGNOSIS — K560 Paralytic ileus: Secondary | ICD-10-CM

## 2016-11-16 DIAGNOSIS — Z95828 Presence of other vascular implants and grafts: Secondary | ICD-10-CM

## 2016-11-16 DIAGNOSIS — R112 Nausea with vomiting, unspecified: Secondary | ICD-10-CM

## 2016-11-16 DIAGNOSIS — Z66 Do not resuscitate: Secondary | ICD-10-CM

## 2016-11-16 LAB — GLUCOSE, CAPILLARY
GLUCOSE-CAPILLARY: 111 mg/dL — AB (ref 65–99)
GLUCOSE-CAPILLARY: 115 mg/dL — AB (ref 65–99)
GLUCOSE-CAPILLARY: 121 mg/dL — AB (ref 65–99)
GLUCOSE-CAPILLARY: 127 mg/dL — AB (ref 65–99)

## 2016-11-16 LAB — COMPREHENSIVE METABOLIC PANEL
ALK PHOS: 119 U/L (ref 38–126)
ALT: 13 U/L — ABNORMAL LOW (ref 14–54)
ANION GAP: 7 (ref 5–15)
AST: 24 U/L (ref 15–41)
Albumin: 2 g/dL — ABNORMAL LOW (ref 3.5–5.0)
BUN: 13 mg/dL (ref 6–20)
CO2: 19 mmol/L — ABNORMAL LOW (ref 22–32)
Calcium: 8 mg/dL — ABNORMAL LOW (ref 8.9–10.3)
Chloride: 108 mmol/L (ref 101–111)
Creatinine, Ser: 0.81 mg/dL (ref 0.44–1.00)
GLUCOSE: 125 mg/dL — AB (ref 65–99)
Potassium: 4.4 mmol/L (ref 3.5–5.1)
Sodium: 134 mmol/L — ABNORMAL LOW (ref 135–145)
TOTAL PROTEIN: 4.9 g/dL — AB (ref 6.5–8.1)
Total Bilirubin: 0.4 mg/dL (ref 0.3–1.2)

## 2016-11-16 LAB — CBC
HCT: 39.5 % (ref 36.0–46.0)
Hemoglobin: 12.8 g/dL (ref 12.0–15.0)
MCH: 30 pg (ref 26.0–34.0)
MCHC: 32.4 g/dL (ref 30.0–36.0)
MCV: 92.5 fL (ref 78.0–100.0)
PLATELETS: 266 10*3/uL (ref 150–400)
RBC: 4.27 MIL/uL (ref 3.87–5.11)
RDW: 14.6 % (ref 11.5–15.5)
WBC: 7.7 10*3/uL (ref 4.0–10.5)

## 2016-11-16 LAB — CULTURE, BODY FLUID-BOTTLE

## 2016-11-16 LAB — HEPARIN LEVEL (UNFRACTIONATED): Heparin Unfractionated: 0.43 IU/mL (ref 0.30–0.70)

## 2016-11-16 LAB — CULTURE, BODY FLUID W GRAM STAIN -BOTTLE: Culture: NO GROWTH

## 2016-11-16 LAB — MAGNESIUM: Magnesium: 2 mg/dL (ref 1.7–2.4)

## 2016-11-16 NOTE — Progress Notes (Signed)
   Subjective/Chief Complaint: Feels ok, having flatus, minimal pain, no n/v   Objective: Vital signs in last 24 hours: Temp:  [97.7 F (36.5 C)-97.9 F (36.6 C)] 97.9 F (36.6 C) (06/27 0527) Pulse Rate:  [60-78] 78 (06/27 0527) Resp:  [18] 18 (06/27 0527) BP: (102-128)/(53-61) 102/61 (06/27 0527) SpO2:  [97 %-100 %] 97 % (06/27 0527) Last BM Date: 11/14/16  Intake/Output from previous day: 06/26 0701 - 06/27 0700 In: 480 [P.O.:480] Out: 75 [Urine:75] Intake/Output this shift: No intake/output data recorded.  GI: mild distended, nontender, some bs  Lab Results:   Recent Labs  11/15/16 0305 11/16/16 0334  WBC 8.5 7.7  HGB 12.1 12.8  HCT 37.9 39.5  PLT 309 266   BMET  Recent Labs  11/14/16 1244  NA 135  K 4.3  CL 107  CO2 22  GLUCOSE 148*  BUN 10  CREATININE 0.72  CALCIUM 8.0*   PT/INR No results for input(s): LABPROT, INR in the last 72 hours. ABG No results for input(s): PHART, HCO3 in the last 72 hours.  Invalid input(s): PCO2, PO2  Studies/Results: Dg Abd 1 View  Result Date: 11/15/2016 CLINICAL DATA:  Abdominal distention, back pain, history of SP of EXAM: ABDOMEN - 1 VIEW COMPARISON:  Abdomen film of 11/14/2016 and CT abdomen pelvis of 11/10/2016 FINDINGS: Compared to the most recent KUB, there is less gaseous distention of large and small bowel. There is some gaseous distention of the stomach. No small bowel obstruction is evident. A moderate amount of feces is noted in the rectosigmoid colon. No opaque calculi are seen. The bones are unremarkable. IMPRESSION: 1. No present evidence of small-bowel obstruction. Very little gaseous distention of large or small bowel. 2. No opaque calculi . Electronically Signed   By: Ivar Drape M.D.   On: 11/15/2016 08:45   Dg Abd Portable 1v  Result Date: 11/14/2016 CLINICAL DATA:  Small bowel obstruction, intra-abdominal ascites an malignancy. EXAM: PORTABLE ABDOMEN - 1 VIEW COMPARISON:  Abdominal and pelvic  CT scan of November 10, 2016 FINDINGS: There are loops of mildly distended gas-filled small bowel present. There is stool and gas within normal caliber colon and rectum. No free extraluminal gas collections are observed. IMPRESSION: The bowel gas pattern is consistent with small bowel ileus or partial mid to distal small bowel obstruction. Electronically Signed   By: David  Martinique M.D.   On: 11/14/2016 11:43    Anti-infectives: Anti-infectives    None      Assessment/Plan: Ovarian cancer  No role for surgery at this point, agree with chemotherapy Can advance diet as tolerated Will sign off  [facility] 11/16/2016

## 2016-11-16 NOTE — Progress Notes (Addendum)
ANTICOAGULATION CONSULT NOTE - Follow Up Consult  Pharmacy Consult for Heparin  Indication: DVT  Patient Measurements: Height: 5' 1.5" (156.2 cm) Weight: 161 lb (73 kg) IBW/kg (Calculated) : 48.95  HDWt: 64.7 kg  Vital Signs: Temp: 97.9 F (36.6 C) (06/27 0527) Temp Source: Oral (06/27 0527) BP: 102/61 (06/27 0527) Pulse Rate: 78 (06/27 0527)  Labs:  Recent Labs  11/14/16 0249 11/14/16 1244  11/14/16 2153 11/15/16 0305 11/16/16 0334  HGB 11.7*  --   --   --  12.1 12.8  HCT 36.2  --   --   --  37.9 39.5  PLT 285  --   --   --  309 266  HEPARINUNFRC 0.24*  --   < > 0.34 0.36 0.43  CREATININE  --  0.72  --   --   --   --   < > = values in this interval not displayed.  Estimated Creatinine Clearance: 58.8 mL/min (by C-G formula based on SCr of 0.72 mg/dL).  Assessment: 52 yof with malignant SBO continuing on heparin for DVT. Plans to transition to Lovenox for outpatient therapy in setting of likely CA. CBC stable. Hematuria has continued - MD is aware and wants to continue heparin for now and monitor Hg trend. RN to notify if worsens. CBC stable.  Heparin level remains therapeutic at 0.43. IVC filter originally planned for 6/26 but holding off for now with Hg stabilized.   Goal of Therapy:  Heparin level 0.3-0.7 units/ml Monitor platelets by anticoagulation protocol: Yes   Plan:  Continue heparin gtt at 1500 units/hr Daily heparin level/CBC Monitor closely for s/sx bleeding F/u long-term anticoagulation plan   Elicia Lamp, PharmD, BCPS Clinical Pharmacist Rx Phone # for today: [phone] After 3:30PM, please call Main Rx: #[id number] 11/16/2016 8:41 AM \

## 2016-11-16 NOTE — Progress Notes (Addendum)
PROGRESS NOTE    Annette Farmer  ERD:[id number] DOB: [birth year] DOA: 11/10/2016 PCP: Wardell Honour, MD    Brief Narrative: Patient is a 71 year old female who presented to PCPs office 11/05/2016 with right lower extremity edema right hip pain after a fall. Patient noted to have chronic right-sided weakness secondary to a prior stroke. Doppler ultrasounds done concerning for extensive bilateral lower extremity DVT. CT angiogram was done which was negative for PE but CT abdomen did show extensive nodular implants consistent with carcinomatosis along with pelvic mass. Patient also noted to have ascites. On 11/11/2016 patient underwent ultrasound-guided paracentesis with cytology positive for adenocarcinoma. Concern for primary GYN pathology. Patient also noted to have extensive DVT felt likely secondary to untreated cancer. Vascular surgery and IR consulted. General surgery was also consulted due to concerns for small bowel obstruction versus ileus. Patient had a bout of hematuria have a hemoglobin seems stable. Patient seen by GYN oncology and hematology oncology were recommending neoadjuvant chemotherapy as soon as possible versus debulking. Patient to be transferred to [facility] for Port-A-Cath placement and to start neoadjuvant chemotherapy as soon as possible.  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 Patient underwent paracentesis and cytology consistent with adenocarcinoma likely from probable right ovarian cancer. Patient started on anticoagulation with IV heparin. Patient seen in consultation by GYN oncology were recommending neoadjuvant chemotherapy given need for continued anticoagulation from recent DVT and felt that survival was equivalent with either neoadjuvant chemotherapy and primary debulking. Hematology oncology was further consulted and patient seen in consultation by Dr. Alvy Bimler who recommended adjuvant chemotherapy to be started as soon as possible while inpatient. Patient will need a Port-A-Cath placed and interventional radiology has been consulted for Port-A-Cath to be placed tomorrow morning. Patient will be transferred to [facility] in order to start first cycle of treatment and close monitoring for side effects and CBGs.  #2 mild GI obstructive symptoms/ileus Felt likely secondary to recently diagnosed stage IIIc ovarian cancer with peritoneal carcinomatosis. Patient was seen in consultation by general surgery and conservative approach was taken. Patient with clinical improvement with flatus and states had a small bowel movement today. Per general surgery no role for surgery at this time and in agreement with adjuvant chemotherapy. Per general surgery advance diet as tolerated. Patient currently tolerating a full diet will advance to a soft diet and monitor closely.  #3 right lower extremity extensive DVT Likely secondary to stage IIIc untreated right ovarian cancer. Initially it was felt patient may need IVC filter placed due to concerns for hematuria have a hemoglobin has remained stable and patient currently on IV heparin pending Port-A-Cath placement. Vascular surgery was consulted  and it was felt patient did not need a thrombectomy at  this time. Once Port-A-Cath has been placed and no further interventions needed could transition to Lovenox/NOAC per hematology/oncology recommendations.  #4 well-controlled diabetes mellitus Hemoglobin A1c 5.9 on 09/28/2016. CBGs are ranging from 111-116. Continue sliding scale insulin. Monitor closely once chemotherapy has been started.  #5 acute kidney injury Felt to be secondary to a prerenal azotemia secondary to volume depletion and dehydration. Renal function improved and currently resolved.  #6 gastroesophageal reflux disease PPI.  #7 hypertension Stable. Continue Toprol-XL. Blood pressure seems borderline will discontinue Cozaar.  #8 prognosis Patient with stage IIIc right ovarian cancer with a poor social situation. Patient has been seen by hematology oncology who feel patient's disease is highly treatable with potential curable with aggressive management. Palliative care meeting pending with family for goals of care.    DVT prophylaxis: Heparin Code Status: Full Family Communication: No family at bedside. Updated patient. Disposition Plan: Transfer to [facility] for Port-A-Cath placement and immediate start for chemotherapy.   Consultants:   Palliative care Dr. Smitty Pluck 11/15/2016  Oncology: Dr. Alvy Bimler 11/15/2016  Interventional radiology: Dr. Pascal Lux 11/14/2016  Gen. surgery: Dr. Donne Hazel 11/14/2016   GYN oncology: Dr. Denman George 11/15/2016/Dr. Delsa Sale 11/11/2016  Vascular surgery Dr. Donzetta Matters 11/12/2016    Procedures:   Lower extremity Dopplers 11/10/2016  CT angiogram chest 11/10/2016  CT abdomen and pelvis 11/10/2016  Paracentesis for intervention radiology 11/11/2016--per Dr. Geroge Baseman successful ultrasound-guided paracentesis yielding 420 mL of peritoneal fluid.---   chest x-ray 11/10/2016  Abdominal films 11/14/2016, 11/15/2016    Antimicrobials:   None   Subjective: Patient  laying in bed denies any chest pain. No shortness of breath. Passing flatus. Patient states had a small bowel movement this morning. Patient asking for regular diet/food.  Objective: Vitals:   11/15/16 0500 11/15/16 1318 11/15/16 2124 11/16/16 0527  BP: 117/64 (!) 110/59 (!) 128/53 102/61  Pulse: 69 77 60 78  Resp: 14  18 18   Temp: 98.3 F (36.8 C) 97.8 F (36.6 C) 97.7 F (36.5 C) 97.9 F (36.6 C)  TempSrc: Oral  Oral Oral  SpO2: 98% 98% 100% 97%  Weight:      Height:        Intake/Output Summary (Last 24 hours) at 11/16/16 1111 Last data filed at 11/15/16 2259  Gross per 24 hour  Intake              480 ml  Output               75 ml  Net              405 ml   Filed Weights   11/11/16 0058  Weight: 73 kg (161 lb)    Examination:  General exam: Appears calm and comfortable  Respiratory system: Clear to auscultation Anterior lung fields. Respiratory effort normal. Cardiovascular system: S1 & S2 heard, RRR. No JVD, murmurs, rubs, gallops or clicks. No pedal edema. Gastrointestinal system: Abdomen is nondistended, soft and nontender. No organomegaly or masses felt. Normal bowel sounds heard. Central nervous system: Alert and oriented. No focal neurological deficits. Extremities: Symmetric 5 x 5 power. Skin: No rashes, lesions or ulcers Psychiatry: Judgement and insight appear normal. Mood & affect appropriate.     Data Reviewed: I have personally reviewed following labs and imaging studies  CBC:  Recent Labs Lab 11/10/16 1656  11/12/16 0217 11/13/16 0535 11/14/16 0249 11/15/16 0305 11/16/16 0334  WBC 8.1  < > 6.9 7.7 7.5 8.5 7.7  NEUTROABS 6.8  --   --   --   --   --   --  HGB 12.4  < > 11.9* 11.9* 11.7* 12.1 12.8  HCT 37.4  < > 37.9 37.6 36.2 37.9 39.5  MCV 89.3  < > 90.9 91.5 90.0 90.0 92.5  PLT 345  < > 301 275 285 309 266  < > = values in this interval not displayed. Basic Metabolic Panel:  Recent Labs Lab 11/10/16 1656 11/11/16 0130  11/12/16 1423 11/14/16 1244  NA 135 134* 135 135  K 4.1 4.6 3.9 4.3  CL 102 101 106 107  CO2 23 24 22 22   GLUCOSE 150* 129* 175* 148*  BUN 33* 28* 15 10  CREATININE 1.15* 1.12* 0.89 0.72  CALCIUM 8.8* 8.8* 8.0* 8.0*   GFR: Estimated Creatinine Clearance: 58.8 mL/min (by C-G formula based on SCr of 0.72 mg/dL). Liver Function Tests:  Recent Labs Lab 11/10/16 1656  AST 24  ALT 12*  ALKPHOS 123  BILITOT 0.8  PROT 6.5  ALBUMIN 3.0*    Recent Labs Lab 11/10/16 1656  LIPASE 24   No results for input(s): AMMONIA in the last 168 hours. Coagulation Profile:  Recent Labs Lab 11/11/16 1231  INR 0.96   Cardiac Enzymes: No results for input(s): CKTOTAL, CKMB, CKMBINDEX, TROPONINI in the last 168 hours. BNP (last 3 results) No results for input(s): PROBNP in the last 8760 hours. HbA1C: No results for input(s): HGBA1C in the last 72 hours. CBG:  Recent Labs Lab 11/15/16 0609 11/15/16 1123 11/15/16 1645 11/15/16 2131 11/16/16 0633  GLUCAP 125* 119* 87 116* 111*   Lipid Profile: No results for input(s): CHOL, HDL, LDLCALC, TRIG, CHOLHDL, LDLDIRECT in the last 72 hours. Thyroid Function Tests: No results for input(s): TSH, T4TOTAL, FREET4, T3FREE, THYROIDAB in the last 72 hours. Anemia Panel: No results for input(s): VITAMINB12, FOLATE, FERRITIN, TIBC, IRON, RETICCTPCT in the last 72 hours. Sepsis Labs: No results for input(s): PROCALCITON, LATICACIDVEN in the last 168 hours.  Recent Results (from the past 240 hour(s))  Culture, body fluid-bottle     Status: None (Preliminary result)   Collection Time: 11/11/16  3:51 PM  Result Value Ref Range Status   Specimen Description FLUID PERITONEAL  Final   Special Requests BOTTLES DRAWN AEROBIC AND ANAEROBIC 10CC  Final   Culture NO GROWTH 4 DAYS  Final   Report Status PENDING  Incomplete  Gram stain     Status: None   Collection Time: 11/11/16  3:51 PM  Result Value Ref Range Status   Specimen Description FLUID  PERITONEAL  Final   Special Requests NONE  Final   Gram Stain   Final    MODERATE WBC PRESENT,BOTH PMN AND MONONUCLEAR NO ORGANISMS SEEN    Report Status 11/12/2016 FINAL  Final         Radiology Studies: Dg Abd 1 View  Result Date: 11/15/2016 CLINICAL DATA:  Abdominal distention, back pain, history of SP of EXAM: ABDOMEN - 1 VIEW COMPARISON:  Abdomen film of 11/14/2016 and CT abdomen pelvis of 11/10/2016 FINDINGS: Compared to the most recent KUB, there is less gaseous distention of large and small bowel. There is some gaseous distention of the stomach. No small bowel obstruction is evident. A moderate amount of feces is noted in the rectosigmoid colon. No opaque calculi are seen. The bones are unremarkable. IMPRESSION: 1. No present evidence of small-bowel obstruction. Very little gaseous distention of large or small bowel. 2. No opaque calculi . Electronically Signed   By: Ivar Drape M.D.   On: 11/15/2016 08:45   Dg Abd Portable  1v  Result Date: 11/14/2016 CLINICAL DATA:  Small bowel obstruction, intra-abdominal ascites an malignancy. EXAM: PORTABLE ABDOMEN - 1 VIEW COMPARISON:  Abdominal and pelvic CT scan of November 10, 2016 FINDINGS: There are loops of mildly distended gas-filled small bowel present. There is stool and gas within normal caliber colon and rectum. No free extraluminal gas collections are observed. IMPRESSION: The bowel gas pattern is consistent with small bowel ileus or partial mid to distal small bowel obstruction. Electronically Signed   By: David  Martinique M.D.   On: 11/14/2016 11:43        Scheduled Meds: . aspirin  325 mg Oral Daily  . gabapentin  300 mg Oral Daily  . insulin aspart  0-5 Units Subcutaneous QHS  . insulin aspart  0-9 Units Subcutaneous TID WC  . losartan  50 mg Oral Daily  . metoprolol succinate  25 mg Oral Daily  . nystatin   Topical TID  . pantoprazole  40 mg Oral Q0600   Continuous Infusions: . sodium chloride 75 mL/hr at 11/16/16 0832  .  heparin 1,500 Units/hr (11/15/16 1318)     LOS: 6 days    Time spent: 51 minutes    Dwaine Pringle, MD [facility] Pager [phone]  If 7PM-7AM, please contact night-coverage [email or web address] Password [email or web address] 11/16/2016, 11:11 AM

## 2016-11-16 NOTE — Progress Notes (Signed)
IR following for possible IVC filter placement due to extensive DVT.  Patient experienced gross hematuria with drop in hemoglobin while on heparin drip.  This has stabilized.  She has resumed heparin and is currently tolerating. HgB 12.8. Per MD notes, IVC filter not appropriate at this time. Current plan is to transition to Lovenox/NOAC at discharge.  Note patient is planning for Port-Cath placement at [facility]. Will see prior to transfer if able.  Brynda Greathouse, MMS RDN PA-C 12:52 PM

## 2016-11-16 NOTE — Progress Notes (Signed)
START ON PATHWAY REGIMEN - Ovarian     A cycle is every 21 days:     Paclitaxel      Carboplatin   **Always confirm dose/schedule in your pharmacy ordering system**    Patient Characteristics: Newly Diagnosed, Neoadjuvant Therapy AJCC T Category: TX AJCC N Category: NX AJCC M Category: M0 Therapeutic Status: Newly Diagnosed, Neoadjuvant Therapy AJCC 8 Stage Grouping: IIIC Intent of Therapy: Curative Intent, Discussed with Patient

## 2016-11-16 NOTE — Progress Notes (Signed)
Daily Progress Note   Patient Name: Annette Farmer       Date: 11/16/2016 DOB: 12-31-1945  Age: 71 y.o. MRN#: [id number] Attending Physician: Eugenie Filler, MD Primary Care Physician: Wardell Honour, MD Admit Date: 11/10/2016  Reason for Consultation/Follow-up: Establishing goals of care  Subjective: Extensive conversation with patient, brother Annette Farmer), brother in law (Annette Farmer), and friends from church [place], [place], and two others).  We reviewed HCPOA and Living Will.  Bhavika had created these previously.  Copies of the two documents were made and placed on the shadow chart to be scanned into EPIC.  [name] named Annette Farmer (brother in law) as her HCPOA and durable POA.  Her living will states she would not want resuscitation.  We went on to discuss a current code status for this hospitalization.  [name] stated that if she became so sick that her her stopped or she stopped breathing she would not want to be resuscitated.  She is a DNR.  We discussed at length the recommendation to go to SNF.  [name] had a bad experience at [facility] previously.  This is primarily because of Ermel's sleeping hours.  She sleeps on and off from 2:00 am to 2:00 pm each day.  She states that her body does not do well when this sleep pattern is disrupted.   She does not want to go to SNF.    We discussed at [place], [place], [place], outpatient palliative care.  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  There is only a small path thru the rooms of the house with piles of stuff on either side.  They felt strongly that her home would not be conducive to being able to receive therapy.  Further more until [name] is stronger they were very uncomfortable letting her stay alone.  They strongly encouraged her to  go to SNF at least for the short term until she is stronger.  After 60 minutes I left [name] with her friends and family to continue the discussion.  Assessment: 71 yo female with history of stroke.  Very private person.  Reportedly a hoarder.  With newly diagnosed DVT and GYN adenocarcinoma stage 3.  She has decided to move forward with port placement and chemotherapy.  She is ill equipped to care for herself at  home.    She may have poor insight to her current situation and the help she will need.    Length of Stay: 6  Current Medications: Scheduled Meds:  . aspirin  325 mg Oral Daily  . gabapentin  300 mg Oral Daily  . insulin aspart  0-5 Units Subcutaneous QHS  . insulin aspart  0-9 Units Subcutaneous TID WC  . losartan  50 mg Oral Daily  . metoprolol succinate  25 mg Oral Daily  . nystatin   Topical TID  . pantoprazole  40 mg Oral Q0600    Continuous Infusions: . sodium chloride 75 mL/hr at 11/16/16 1402  . heparin 1,500 Units/hr (11/15/16 1318)    PRN Meds: acetaminophen **OR** acetaminophen, albuterol, alum & mag hydroxide-simeth, gi cocktail, guaiFENesin-dextromethorphan, lidocaine, morphine injection, ondansetron **OR** ondansetron (ZOFRAN) IV, promethazine, traMADol  Physical Exam        Well developed overweight pale complexion with gray hair (appears older than stated age) A&O with clear speech. Coherent.   Vital Signs: BP 104/71 (BP Location: Left Arm)   Pulse 91   Temp 97.6 F (36.4 C) (Oral)   Resp 18   Ht 5' 1.5" (1.562 m)   Wt 73 kg (161 lb)   SpO2 98%   BMI 29.93 kg/m  SpO2: SpO2: 98 % O2 Device: O2 Device: Not Delivered O2 Flow Rate:    Intake/output summary:   Intake/Output Summary (Last 24 hours) at 11/16/16 1813 Last data filed at 11/16/16 1700  Gross per 24 hour  Intake              480 ml  Output              475 ml  Net                5 ml   LBM: Last BM Date: 11/10/16 Baseline Weight: Weight: 73 kg (161 lb) Most recent weight:  Weight: 73 kg (161 lb)       Palliative Assessment/Data:    Flowsheet Rows     Most Recent Value  Intake Tab  Referral Department  Hospitalist  Unit at Time of Referral  Oncology Unit  Palliative Care Primary Diagnosis  Cancer  Date Notified  11/14/16  Palliative Care Type  New Palliative care  Reason for referral  Clarify Goals of Care  Date of Admission  11/10/16  Date first seen by Palliative Care  11/15/16  # of days Palliative referral response time  1 Day(s)  # of days IP prior to Palliative referral  4  Clinical Assessment  Palliative Performance Scale Score  50%  Psychosocial & Spiritual Assessment  Palliative Care Outcomes  Patient/Family meeting held?  Yes  Who was at the meeting?  patient, brother in law, brother, and friends  Palliative Care Outcomes  Clarified goals of care, Changed CPR status  Patient/Family wishes: Interventions discontinued/not started   Mechanical Ventilation      Patient Active Problem List   Diagnosis Date Noted  . Ovarian cancer on right [facility]): Stage 3C 11/16/2016  . Ileus ([place]) 11/16/2016  . Acute thromboembolism of deep veins of right lower extremity (HCC)   . Port-a-cath in place   . Metastatic cancer ([place])   . Palliative care encounter   . Goals of care, counseling/discussion   . Bowel obstruction ([place])   . Severe protein-calorie malnutrition ([place])   . AKI (acute kidney injury) ([place]) 11/11/2016  . Pelvic mass 11/11/2016  . Carcinomatosis ([place]) 11/11/2016  .  DVT (deep venous thrombosis) ([place]) 11/10/2016  . Refusal of statin medication by patient 09/28/2016  . 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 Type 2 diabetes mellitus ([place]) 07/20/2006  . HYPERCHOLESTEROLEMIA 07/20/2006  . OBESITY, NOS 07/20/2006  . HYPERTENSION, BENIGN SYSTEMIC 07/20/2006  . HEMORRHOIDS, NOS 07/20/2006  . CONSTIPATION 07/20/2006  . INCONTINENCE, STRESS, FEMALE 07/20/2006  . MENOPAUSAL SYNDROME  07/20/2006    Palliative Care Plan    Recommendations/Plan: Please write in discharge summary "Palliative Care outpatient follow up" whether she discharges to home or SNF PMT will continue to follow supportively here in the hospital. Patient's HCPOA is her brother in law Mikki Santee Annette Farmer)  Goals of Care and Additional Recommendations:  Limitations on Scope of Treatment: Full Scope Treatment  Code Status:  DNR  Prognosis:   Unable to determine   Discharge Planning:  [place] for rehab with Palliative care service follow-up.   Patient would prefer to go home, but I am hopeful she will come around before discharge.  Care plan was discussed with patient and family   Thank you for allowing the Palliative Medicine Team to assist in the care of this patient.  Total time spent:  60 min.     Greater than 50%  of this time was spent counseling and coordinating care related to the above assessment and plan.  Imogene Burn, PA-C Palliative Medicine  Please contact Palliative MedicineTeam phone at [phone] for questions and concerns between 7 am - 7 pm.   Please see AMION for individual provider pager numbers.

## 2016-11-16 NOTE — Progress Notes (Signed)
Annette Farmer   DOB:1946/04/09   JA#:[id number]    Subjective: She feels okay.  She had one episode of nausea/vomiting recently.  She had small bowel movement today. The patient denies any recent signs or symptoms of bleeding such as spontaneous epistaxis, hematuria or hematochezia. She is seen today, surrounded by multiple close friends from her church.  Annette Farmer, Annette Farmer and Annette Farmer are present  Objective:  Vitals:   11/16/16 0527 11/16/16 1330  BP: 102/61 104/71  Pulse: 78 91  Resp: 18 18  Temp: 97.9 F (36.6 C) 97.6 F (36.4 C)     Intake/Output Summary (Last 24 hours) at 11/16/16 1535 Last data filed at 11/16/16 1300  Gross per 24 hour  Intake              720 ml  Output              275 ml  Net              445 ml    GENERAL:alert, no distress and comfortable SKIN: skin color, texture, turgor are normal, no rashes or significant lesions EYES: normal, Conjunctiva are pink and non-injected, sclera clear Musculoskeletal:no cyanosis of digits and no clubbing  NEURO: alert & oriented x 3 with fluent speech, no focal motor/sensory deficits   Labs:  Lab Results  Component Value Date   WBC 7.7 11/16/2016   HGB 12.8 11/16/2016   HCT 39.5 11/16/2016   MCV 92.5 11/16/2016   PLT 266 11/16/2016   NEUTROABS 6.8 11/10/2016    Lab Results  Component Value Date   NA 134 (L) 11/16/2016   K 4.4 11/16/2016   CL 108 11/16/2016   CO2 19 (L) 11/16/2016    Studies:  Dg Abd 1 View  Result Date: 11/15/2016 CLINICAL DATA:  Abdominal distention, back pain, history of SP of EXAM: ABDOMEN - 1 VIEW COMPARISON:  Abdomen film of 11/14/2016 and CT abdomen pelvis of 11/10/2016 FINDINGS: Compared to the most recent KUB, there is less gaseous distention of large and small bowel. There is some gaseous distention of the stomach. No small bowel obstruction is evident. A moderate amount of feces is noted in the rectosigmoid colon. No opaque calculi are seen. The bones are unremarkable. IMPRESSION:  1. No present evidence of small-bowel obstruction. Very little gaseous distention of large or small bowel. 2. No opaque calculi . Electronically Signed   By: Annette Farmer M.D.   On: 11/15/2016 08:45    Assessment & Plan:  Probable right ovarian cancer with peritoneal carcinomatosis with ascites I agree with neoadjuvant chemotherapy approach She appears very frail and is unlikely able to withstand primary surgical resection especially with recent diagnosis of extensive DVT, making primary debulking surgery somewhat risky  She will need port placement as soon as possible I have discussed with primary service to arrange transferr to [facility] to start first cycle of treatment and close monitoring for side effects and her blood sugar afterwards prior to discharge  Mild GI obstructive symptoms/ileus She is able to tolerate some clear liquid albeit with some nausea symptoms during exam Continue conservative approach She would benefit from inpatient chemotherapy as soon as possible  Extensive DVT Due to untreated cancer I do not feel it is necessary for IVC filter placement Consider transition to Lovenox/NOAC upon discharge  Strong family history of cancer I would refer her to see genetic counselor after discharge  Diabetes and history of stroke The patient would be at high  risk of severe hyperglycemia after treatment I recommend first dose of chemo to be given in the inpatient setting with close monitoring of her blood sugar afterwards  Poor social circumstances  The patient is very adamant she would not go to skilled nursing facility She claims to have close friends at church who would be willing to provide transportation back and forth to the cancer center for treatment Her church friends has expressed concern about her safety We will continue to work with palliative care team and case management for discharge planning  Goals of care Her disease is highly treatable,  potentially curable with aggressive management The patient has living will at home We discussed [place] and she desired to remain full code for now  Discharge planning She will get port placement soon I have discussed with primary service to arrange transferr to [facility] long hospital to begin cycle 1 of treatment tomorrow if possible This is not a situation where she can wait for outpatient treatment due to her GI symptoms  Annette Lark, MD 11/16/2016  3:35 PM

## 2016-11-16 NOTE — Progress Notes (Signed)
Physical Therapy Treatment Patient Details Name: Annette Farmer MRN: [id number] DOB: 1946-01-05 Today's Date: 11/16/2016    History of Present Illness Pt is a 71 y.o. female with medical history significant of HTN, DM type II, CVA, and depression. She presented with complaints of right leg swelling over the last 1 week.  Ultrasound revealed extensive clot RLE. Abdominal CT revealed right adnexal tumour with carcinomatosis. Plan for aracentesis on 6/29; cytology is still pending; however, she will likely require an omental biopsy for diagnosis. Palliative care mtg 6/27.    PT Comments    Patient progressing slowly towards PT goals. Needs lots of encouragement to mobilize. Continues to demonstrate weakness in BLEs and impaired balance. Pt very unsteady and refuses to use RW for support. High fall risk. Plans to have meeting with palliative care today with family and friends. Will follow up. Most likely will need SNF but pt refusing this as well as HHPT.    Follow Up Recommendations  Supervision - Intermittent;Home health PT     Equipment Recommendations  None recommended by PT    Recommendations for Other Services       Precautions / Restrictions Precautions Precautions: Fall Restrictions Weight Bearing Restrictions: No    Mobility  Bed Mobility Overal bed mobility: Needs Assistance Bed Mobility: Rolling;Sidelying to Sit;Sit to Supine Rolling: Modified independent (Device/Increase time) Sidelying to sit: Supervision;HOB elevated   Sit to supine: HOB elevated;Supervision   General bed mobility comments: Use of rail and increased time to get to EOB. No dizziness. Able to get into bed without assist.   Transfers Overall transfer level: Needs assistance Equipment used: Straight cane Transfers: Sit to/from Stand Sit to Stand: Min guard Stand pivot transfers: Min guard       General transfer comment: Min guard for safety. Declined using RW. Stood from [employer], from toilet x1  using grab bar.  Ambulation/Gait Ambulation/Gait assistance: Min assist Ambulation Distance (Feet): 120 Feet (+ 20') Assistive device: Straight cane Gait Pattern/deviations: Drifts right/left;Step-through pattern;Decreased stride length;Staggering left;Staggering right Gait velocity: decreased Gait velocity interpretation: Below normal speed for age/gender General Gait Details: Slow, not steady gait using SPC. 2 LOB within room and bil knee instability noted. 2/4 DOE.    Stairs            Wheelchair Mobility    Modified Rankin (Stroke Patients Only)       Balance Overall balance assessment: Needs assistance Sitting-balance support: Feet supported;No upper extremity supported Sitting balance-Leahy Scale: Fair Sitting balance - Comments: Able to donn shoes (slip ons) sitting EOB, cannot donn/doff socks today.   Standing balance support: During functional activity Standing balance-Leahy Scale: Fair Standing balance comment: Requires UE support in standing using SPC. Able to perform pericare with min guard assist,.                             Cognition Arousal/Alertness: Awake/alert Behavior During Therapy: WFL for tasks assessed/performed Overall Cognitive Status: Impaired/Different from baseline Area of Impairment: Safety/judgement                         Safety/Judgement: Decreased awareness of safety;Decreased awareness of deficits            Exercises      General Comments        Pertinent Vitals/Pain Pain Assessment: Faces Faces Pain Scale: Hurts little more Pain Location: pelvis Pain Descriptors / Indicators: Sore Pain Intervention(s):  Monitored during session;Premedicated before session;Repositioned;Limited activity within patient's tolerance    Home Living                      Prior Function            PT Goals (current goals can now be found in the care plan section) Progress towards PT goals: Progressing  toward goals    Frequency    Min 3X/week      PT Plan Current plan remains appropriate    Co-evaluation              AM-PAC PT "6 Clicks" Daily Activity  Outcome Measure  Difficulty turning over in bed (including adjusting bedclothes, sheets and blankets)?: None Difficulty moving from lying on back to sitting on the side of the bed? : None Difficulty sitting down on and standing up from a chair with arms (e.g., wheelchair, bedside commode, etc,.)?: None Help needed moving to and from a bed to chair (including a wheelchair)?: A Little Help needed walking in hospital room?: A Little Help needed climbing 3-5 steps with a railing? : A Lot 6 Click Score: 20    End of Session Equipment Utilized During Treatment: Gait belt Activity Tolerance: Patient limited by fatigue Patient left: in bed;with call bell/phone within reach;with bed alarm set Nurse Communication: Mobility status PT Visit Diagnosis: Unsteadiness on feet (R26.81);History of falling (Z91.81);Muscle weakness (generalized) (M62.81)     Time: 0071-2197 PT Time Calculation (min) (ACUTE ONLY): 21 min  Charges:  $Therapeutic Activity: 8-22 mins                    G Codes:       Wray Kearns, PT, DPT [phone]     Marguarite Arbour A Luana Tatro 11/16/2016, 9:49 AM

## 2016-11-17 LAB — BASIC METABOLIC PANEL
ANION GAP: 8 (ref 5–15)
BUN: 17 mg/dL (ref 6–20)
CHLORIDE: 105 mmol/L (ref 101–111)
CO2: 21 mmol/L — ABNORMAL LOW (ref 22–32)
Calcium: 8.1 mg/dL — ABNORMAL LOW (ref 8.9–10.3)
Creatinine, Ser: 0.84 mg/dL (ref 0.44–1.00)
GFR calc Af Amer: >60 mL/min (ref >60)
Glucose, Bld: 116 mg/dL — ABNORMAL HIGH (ref 65–99)
POTASSIUM: 4.1 mmol/L (ref 3.5–5.1)
SODIUM: 134 mmol/L — AB (ref 135–145)

## 2016-11-17 LAB — CBC
HEMATOCRIT: 36 % (ref 36.0–46.0)
HEMOGLOBIN: 12.2 g/dL (ref 12.0–15.0)
MCH: 30 pg (ref 26.0–34.0)
MCHC: 33.9 g/dL (ref 30.0–36.0)
MCV: 88.7 fL (ref 78.0–100.0)
Platelets: 337 10*3/uL (ref 150–400)
RBC: 4.06 MIL/uL (ref 3.87–5.11)
RDW: 14.3 % (ref 11.5–15.5)
WBC: 7.3 10*3/uL (ref 4.0–10.5)

## 2016-11-17 LAB — GLUCOSE, CAPILLARY
GLUCOSE-CAPILLARY: 103 mg/dL — AB (ref 65–99)
GLUCOSE-CAPILLARY: 175 mg/dL — AB (ref 65–99)
GLUCOSE-CAPILLARY: 198 mg/dL — AB (ref 65–99)
GLUCOSE-CAPILLARY: 154 mg/dL — AB (ref 65–99)

## 2016-11-17 LAB — HEPARIN LEVEL (UNFRACTIONATED): Heparin Unfractionated: 0.38 IU/mL (ref 0.30–0.70)

## 2016-11-17 MED ORDER — SODIUM CHLORIDE 0.9 % IV SOLN: Freq: Once | INTRAVENOUS | Status: DC | PRN

## 2016-11-17 MED ORDER — FAMOTIDINE IN NACL 20-0.9 MG/50ML-% IV SOLN
20.0000 mg | Freq: Once | INTRAVENOUS | Status: DC | PRN
  Filled 2016-11-17: qty 50

## 2016-11-17 MED ORDER — SODIUM CHLORIDE 0.9 % IV SOLN
Freq: Once | INTRAVENOUS | Status: AC
  Administered 2016-11-17: 16:00:00 via INTRAVENOUS

## 2016-11-17 MED ORDER — PACLITAXEL CHEMO INJECTION 300 MG/50ML
175.0000 mg/m2 | Freq: Once | INTRAVENOUS | Status: AC
  Administered 2016-11-17: 336 mg via INTRAVENOUS
  Filled 2016-11-17: qty 56

## 2016-11-17 MED ORDER — ASPIRIN 81 MG PO CHEW
81.0000 mg | CHEWABLE_TABLET | Freq: Every day | ORAL | Status: DC
  Administered 2016-11-17 – 2016-11-28 (×10): 81 mg via ORAL
  Filled 2016-11-17 (×15): qty 1

## 2016-11-17 MED ORDER — SODIUM CHLORIDE 0.9 % IV SOLN
20.0000 mg | Freq: Once | INTRAVENOUS | Status: DC
  Filled 2016-11-17: qty 2

## 2016-11-17 MED ORDER — SODIUM CHLORIDE 0.9% FLUSH: 3.0000 mL | INTRAVENOUS | Status: DC | PRN

## 2016-11-17 MED ORDER — HEPARIN SOD (PORK) LOCK FLUSH 100 UNIT/ML IV SOLN: 500.0000 [IU] | Freq: Once | INTRAVENOUS | Status: DC | PRN

## 2016-11-17 MED ORDER — DEXTROSE 5 % IV SOLN
175.0000 mg/m2 | Freq: Once | INTRAVENOUS | Status: DC
  Filled 2016-11-17: qty 52

## 2016-11-17 MED ORDER — ALBUTEROL SULFATE (2.5 MG/3ML) 0.083% IN NEBU: 2.5000 mg | INHALATION_SOLUTION | Freq: Once | RESPIRATORY_TRACT | Status: DC | PRN

## 2016-11-17 MED ORDER — SODIUM CHLORIDE 0.9 % IV SOLN
501.6000 mg | Freq: Once | INTRAVENOUS | Status: DC
  Filled 2016-11-17 (×2): qty 50

## 2016-11-17 MED ORDER — SODIUM CHLORIDE 0.9 % IV SOLN
560.0000 mg | Freq: Once | INTRAVENOUS | Status: AC
  Administered 2016-11-17: 560 mg via INTRAVENOUS
  Filled 2016-11-17: qty 56

## 2016-11-17 MED ORDER — EPINEPHRINE PF 1 MG/ML IJ SOLN
0.5000 mg | Freq: Once | INTRAMUSCULAR | Status: DC | PRN
  Filled 2016-11-17: qty 1

## 2016-11-17 MED ORDER — SODIUM CHLORIDE 0.9% FLUSH: 10.0000 mL | INTRAVENOUS | Status: DC | PRN

## 2016-11-17 MED ORDER — EPINEPHRINE PF 1 MG/10ML IJ SOSY: 0.2500 mg | PREFILLED_SYRINGE | Freq: Once | INTRAMUSCULAR | Status: DC | PRN

## 2016-11-17 MED ORDER — DIPHENHYDRAMINE HCL 50 MG/ML IJ SOLN
50.0000 mg | Freq: Once | INTRAMUSCULAR | Status: AC
  Administered 2016-11-17: 50 mg via INTRAVENOUS
  Filled 2016-11-17: qty 1

## 2016-11-17 MED ORDER — DIPHENHYDRAMINE HCL 50 MG/ML IJ SOLN: 25.0000 mg | Freq: Once | INTRAMUSCULAR | Status: DC | PRN

## 2016-11-17 MED ORDER — DIPHENHYDRAMINE HCL 50 MG/ML IJ SOLN: 50.0000 mg | Freq: Once | INTRAMUSCULAR | Status: DC | PRN

## 2016-11-17 MED ORDER — FAMOTIDINE IN NACL 20-0.9 MG/50ML-% IV SOLN
20.0000 mg | Freq: Once | INTRAVENOUS | Status: AC
  Administered 2016-11-17: 20 mg via INTRAVENOUS
  Filled 2016-11-17: qty 50

## 2016-11-17 MED ORDER — COLD PACK MISC ONCOLOGY
1.0000 | Freq: Once | Status: AC | PRN
  Filled 2016-11-17: qty 1

## 2016-11-17 MED ORDER — HEPARIN SOD (PORK) LOCK FLUSH 100 UNIT/ML IV SOLN: 250.0000 [IU] | Freq: Once | INTRAVENOUS | Status: DC | PRN

## 2016-11-17 MED ORDER — ENOXAPARIN SODIUM 80 MG/0.8ML ~~LOC~~ SOLN
80.0000 mg | Freq: Two times a day (BID) | SUBCUTANEOUS | Status: DC
  Administered 2016-11-17: 12:00:00 80 mg via SUBCUTANEOUS
  Filled 2016-11-17: qty 0.8

## 2016-11-17 MED ORDER — DEXAMETHASONE SODIUM PHOSPHATE 10 MG/ML IJ SOLN
20.0000 mg | Freq: Once | INTRAMUSCULAR | Status: AC
  Administered 2016-11-17: 20 mg via INTRAVENOUS
  Filled 2016-11-17: qty 2

## 2016-11-17 MED ORDER — ALTEPLASE 2 MG IJ SOLR
2.0000 mg | Freq: Once | INTRAMUSCULAR | Status: DC | PRN
  Filled 2016-11-17: qty 2

## 2016-11-17 MED ORDER — ENOXAPARIN SODIUM 100 MG/ML ~~LOC~~ SOLN
85.0000 mg | Freq: Two times a day (BID) | SUBCUTANEOUS | Status: AC
  Administered 2016-11-17 – 2016-11-23 (×13): 85 mg via SUBCUTANEOUS
  Filled 2016-11-17 (×14): qty 1

## 2016-11-17 MED ORDER — METHYLPREDNISOLONE SODIUM SUCC 125 MG IJ SOLR: 125.0000 mg | Freq: Once | INTRAMUSCULAR | Status: DC | PRN

## 2016-11-17 MED ORDER — PALONOSETRON HCL INJECTION 0.25 MG/5ML
0.2500 mg | Freq: Once | INTRAVENOUS | Status: AC
Start: 2016-11-17 — End: 2016-11-17
  Administered 2016-11-17: 0.25 mg via INTRAVENOUS
  Filled 2016-11-17: qty 5

## 2016-11-17 NOTE — Progress Notes (Signed)
Tramadol 50 mg was removed from the pyxis.  However, patient was transferred to [facility] before RN can administer. RN handed the tramadol to pharmacist.

## 2016-11-17 NOTE — Progress Notes (Signed)
ANTICOAGULATION CONSULT NOTE - Follow Up Consult  Pharmacy Consult for Heparin transition to Lovenox Indication: DVT  Patient Measurements: Height: 5' (152.4 cm) Weight: 188 lb 9.6 oz (85.5 kg) IBW/kg (Calculated) : 45.5   Vital Signs: Temp: 97.9 F (36.6 C) (06/28 0459) Temp Source: Oral (06/28 0459) BP: 117/68 (06/28 0459) Pulse Rate: 64 (06/28 0459)  Labs:  Recent Labs  11/14/16 1244  11/15/16 0305 11/16/16 0334 11/16/16 0950 11/17/16 0355 11/17/16 0818  HGB  --   < > 12.1 12.8  --  12.2  --   HCT  --   --  37.9 39.5  --  36.0  --   PLT  --   --  309 266  --  337  --   HEPARINUNFRC  --   < > 0.36 0.43  --   --  0.38  CREATININE 0.72  --   --   --  0.81 0.84  --   < > = values in this interval not displayed.  Estimated Creatinine Clearance: 58.8 mL/min (by C-G formula based on SCr of 0.84 mg/dL).  Assessment: 7 yof with probable ovarian cancer, malignant SBO on IV heparin for extensive DVTs. Plans to transition to Lovenox today as no longer planning for Port-a-Cath placement for first cycle of chemotherapy. Recent hematuria noted on 6/25, MD was aware and plan to continue heparin and monitor Hg trend which has remained stable.   Today, 11/17/2016  Heparin level this AM 0.38 on heparin 1500 units/hr  Hgb 12.2, stable, Plts wnl  No bleeding reported  SCr 0.84, CrCl ~58 ml/min  Goal of Therapy:  Heparin level 0.6-1.0 units/ml 4 hours post dose Monitor platelets by anticoagulation protocol: Yes   Plan:   Stop IV heparin  After one hour, begin Lovenox 1mg /kg SQ q12h  Follow CBC at least q72h  Monitor renal function and adjust interval as needed  Daily heparin level/CBC  Monitor closely for s/sx bleeding   Peggyann Juba, PharmD, BCPS Pager: [phone] 11/17/2016 10:10 AM \

## 2016-11-17 NOTE — Progress Notes (Signed)
Dosages and dilutions for Taxol and Carbo verified with Reyne Dumas, RN.

## 2016-11-17 NOTE — Progress Notes (Signed)
Annette Farmer   DOB:06-09-1945   HD#:[id number]    Subjective: The patient was transferred to [facility] last night at 11 PM.  She denies further nausea or vomiting after I saw her.  She had good discussion with friends from church and palliative care yesterday. She bruises easily.The patient denies any recent signs or symptoms of bleeding such as spontaneous epistaxis, hematuria or hematochezia.   Objective:  Vitals:   11/16/16 2316 11/17/16 0459  BP: (!) 118/95 117/68  Pulse: 68 64  Resp:  18  Temp: 98.2 F (36.8 C) 97.9 F (36.6 C)     Intake/Output Summary (Last 24 hours) at 11/17/16 0741 Last data filed at 11/17/16 0459  Gross per 24 hour  Intake              480 ml  Output              400 ml  Net               80 ml    GENERAL:alert, no distress and comfortable SKIN: Noted skin bruising EYES: normal, Conjunctiva are pink and non-injected, sclera clear OROPHARYNX:no exudate, no erythema and lips, buccal mucosa, and tongue normal  NECK: supple, thyroid normal size, non-tender, without nodularity LYMPH:  no palpable lymphadenopathy in the cervical, axillary or inguinal LUNGS: clear to auscultation and percussion with normal breathing effort HEART: regular rate & rhythm and no murmurs and no lower extremity edema ABDOMEN:abdomen soft, distended with ascites.   Musculoskeletal:no cyanosis of digits and no clubbing  NEURO: alert & oriented x 3 with fluent speech, no focal motor/sensory deficits   Labs:  Lab Results  Component Value Date   WBC 7.3 11/17/2016   HGB 12.2 11/17/2016   HCT 36.0 11/17/2016   MCV 88.7 11/17/2016   PLT 337 11/17/2016   NEUTROABS 6.8 11/10/2016    Lab Results  Component Value Date   NA 134 (L) 11/17/2016   K 4.1 11/17/2016   CL 105 11/17/2016   CO2 21 (L) 11/17/2016    Studies:  Dg Abd 1 View  Result Date: 11/15/2016 CLINICAL DATA:  Abdominal distention, back pain, history of SP of EXAM: ABDOMEN - 1 VIEW COMPARISON:   Abdomen film of 11/14/2016 and CT abdomen pelvis of 11/10/2016 FINDINGS: Compared to the most recent KUB, there is less gaseous distention of large and small bowel. There is some gaseous distention of the stomach. No small bowel obstruction is evident. A moderate amount of feces is noted in the rectosigmoid colon. No opaque calculi are seen. The bones are unremarkable. IMPRESSION: 1. No present evidence of small-bowel obstruction. Very little gaseous distention of large or small bowel. 2. No opaque calculi . Electronically Signed   By: Ivar Drape M.D.   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 I recommend we proceed with combination chemotherapy with carboplatin and Taxol with carboplatin being at AUC of 6. We discussed the role of chemotherapy. The intent is of curative intent.  We discussed some of the risks, benefits, side-effects of carboplatin & Taxol  Some of the short term side-effects  included, though not limited to, including weight loss, life threatening infections, risk of allergic reactions, need for transfusions of blood products, nausea, vomiting, change in bowel habits, loss of hair, admission to hospital for various reasons, and risks of death.   Long term side-effects are also discussed including risks of infertility, permanent damage to nerve function, hearing loss, chronic fatigue, kidney damage with possibility needing hemodialysis, and rare secondary malignancy including bone marrow disorders.  The patient is aware that the response rates  discussed earlier is not guaranteed.  After a long discussion, patient made an informed decision to proceed with the prescribed plan of care.  I will order premedication with IV dexamethasone 20 mg this morning We will proceed with treatment after port placement  Mild GI obstructive symptoms/ileus She is able to tolerate some clear liquid albeit with some nausea symptoms during exam This is likely due to carcinomatosis Continue conservative approach She would benefit from inpatient chemotherapy as soon as possible Continue to advance her diet as tolerated  Extensive DVT Due to untreated cancer I do not feel it is necessary for IVC filter placement Consider transition to Lovenox after port placement  Strong family history of cancer I would refer her to see genetic counselor after discharge  Diabetes and history of stroke The patient would be at high risk of severe hyperglycemia after treatment I recommend first dose of chemo to be given in the inpatient setting with close monitoring of her blood sugar afterwards I will reduce her baseline aspirin therapy from 325 mg daily to 81 mg daily  Poor social circumstances  The patient is very adamant she would not go to skilled nursing facility She claims to have close friends at church who would be willing to provide transportation back and forth to the cancer center for treatment Please see documentation by palliative care team dated 11/16/2016. Church friends have significant concerns about safety being at home We will continue to work with palliative care team and case management for discharge planning  Goals of care Her disease is highly treatable, potentially curable with aggressive management The patient has living will at home [place] is changed to DNR per discussion yesterday with palliative care service  Discharge planning She will get port placement soon This is not a situation where she can wait for outpatient  treatment due to her GI symptoms Hopefully, she can be discharged several days after chemotherapy is given. I will follow  Heath Lark, MD 11/17/2016  7:41 AM

## 2016-11-17 NOTE — Progress Notes (Signed)
PROGRESS NOTE    Annette Farmer  DXA:[id number] DOB: 10/19/1945 DOA: 11/10/2016 PCP: Wardell Honour, MD    Brief Narrative: Patient is a 71 year old female who presented to PCPs office 11/05/2016 with right lower extremity edema right hip pain after a fall. Patient noted to have chronic right-sided weakness secondary to a prior stroke. Doppler ultrasounds done concerning for extensive bilateral lower extremity DVT. CT angiogram was done which was negative for PE but CT abdomen did show extensive nodular implants consistent with carcinomatosis along with pelvic mass. Patient also noted to have ascites. On 11/11/2016 patient underwent ultrasound-guided paracentesis with cytology positive for adenocarcinoma. Concern for primary GYN pathology. Patient also noted to have extensive DVT felt likely secondary to untreated cancer. Vascular surgery and IR consulted. General surgery was also consulted due to concerns for small bowel obstruction versus ileus. Patient had a bout of hematuria have a hemoglobin seems stable. Patient seen by GYN oncology and hematology oncology were recommending neoadjuvant chemotherapy as soon as possible versus debulking. Patient was transferred to [facility] for Port-A-Cath placement and to start neoadjuvant chemotherapy as soon as possible. Port placement is delayed, so chemotherapy will be given through peripheral IV 6/28.  Assessment & Plan:   Principal Problem:   Ovarian cancer on right [facility]): Stage 3C Active Problems:   Type 2 diabetes mellitus (HCC)   HYPERTENSION, BENIGN SYSTEMIC   DVT (deep venous thrombosis) (HCC)   AKI (acute kidney injury) ([place])   Pelvic mass   Carcinomatosis ([place])   Metastatic cancer (HCC)   Palliative care encounter   Goals of care, counseling/discussion   Bowel obstruction (HCC)   Severe protein-calorie malnutrition ([place])   Ileus (HCC)   Acute thromboembolism of deep veins of right lower extremity (HCC)   Port-a-cath in  place   DNR (do not resuscitate)  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 Patient underwent paracentesis and cytology consistent with adenocarcinoma likely from probable right ovarian cancer.  - Continue anticoagulation, heparin > lovenox  - No surgical debulking planned at this time - Give neoadjuvant chemotherapy today through peripheral IV, oncology to schedule outpatient port placement prior to next cycle.  - Treatment has curative intent. - Palliative care team has met with patient, feels she has low insight into condition  Mild ileus: Felt likely secondary to recently diagnosed stage IIIc ovarian cancer with peritoneal carcinomatosis.  - Observation recommended by general surgery. Improving overall.  - Advance diet as tolerated, regular. - Oncology feels symptoms will improve with treatment.   Extensive RLE DVT: Likely secondary to stage IIIc untreated right ovarian cancer. Initially it was felt patient may need IVC filter placed due to concerns for hematuria, though hgb stable. Vascular surgery was consulted and it was felt patient did not need a thrombectomy at this time.  - Lovenox and possibly NOAC per hematology/oncology recommendations.  Well-controlled diabetes mellitus: Hemoglobin A1c 5.9 on 09/28/2016.  - Monitor closely with decadron given with chemo today - Continue SSI  Acute kidney injury: Felt to be secondary to a prerenal azotemia secondary to volume depletion and dehydration. Renal function improved and currently resolved.  GERD: Chronic, worse with carcinomatosis - Continue acid suppression  Essential HTN: Stable.  - Continue Toprol-XL. Blood  pressure  seems borderline will discontinue Cozaar.  DVT prophylaxis: Lovenox Code Status: Full Family Communication: No family at bedside.  Disposition Plan: Administer inpatient chemotherapy and monitor functional status. Would need SNF for safe discharge.   Consultants:   Palliative care Dr. Rowe Pavy 11/15/2016  Oncology: Dr. Alvy Bimler 11/15/2016  Interventional radiology: Dr. Pascal Lux 11/14/2016  Gen. surgery: Dr. Donne Hazel 11/14/2016   GYN oncology: Dr. Denman George 11/15/2016/Dr. Delsa Sale 11/11/2016  Vascular surgery Dr. Donzetta Matters 11/12/2016   Procedures:   Lower extremity Dopplers 11/10/2016  CT angiogram chest 11/10/2016  CT abdomen and pelvis 11/10/2016  Paracentesis for intervention radiology 11/11/2016--per Dr. Geroge Baseman successful ultrasound-guided paracentesis yielding 420 mL of peritoneal fluid.---   chest x-ray 11/10/2016  Abdominal films 11/14/2016, 11/15/2016  Antimicrobials:   None  Subjective: Hungry for breakfast, moving bowels, no chest pain or dyspnea. Very weak.   Objective: BP (!) 120/91 (BP Location: Right Arm)   Pulse (!) 104   Temp 98 F (36.7 C) (Oral)   Resp 16   Ht 5' 1.5" (1.562 m)   Wt 86 kg (189 lb 9.6 oz)   SpO2 96%   BMI 35.24 kg/m   General exam: Calm 71yo F in no distress Respiratory system: Nonlabored and clear Cardiovascular system: RRR. No JVD, murmurs, or edema. Gastrointestinal system: Soft, distended but not tympanitic. No tenderness.  Central nervous system: Alert and oriented. No focal neurological deficits. Extremities: No deformities. Skin: No rashes, lesions or ulcers Psychiatry: Judgement and insight appear fair. Mood & affect appropriate.  CBC:  Recent Labs Lab 11/10/16 1656  11/13/16 0535 11/14/16 0249 11/15/16 0305 11/16/16 0334 11/17/16 0355  WBC 8.1  < > 7.7 7.5 8.5 7.7 7.3  NEUTROABS 6.8  --   --   --   --   --   --   HGB 12.4  < > 11.9* 11.7* 12.1 12.8 12.2  HCT 37.4  < > 37.6 36.2 37.9 39.5 36.0  MCV  89.3  < > 91.5 90.0 90.0 92.5 88.7  PLT 345  < > 275 285 309 266 337  < > = values in this interval not displayed. Basic Metabolic Panel:  Recent Labs Lab 11/11/16 0130 11/12/16 1423 11/14/16 1244 11/16/16 0950 11/17/16 0355  NA 134* 135 135 134* 134*  K 4.6 3.9 4.3 4.4 4.1  CL 101 106 107 108 105  CO2 _0 19* 21*  GLUCOSE 129* 175* 148* 125* 116*  BUN 28* _1 CREATININE 1.12* 0.89 0.72 0.81 0.84  CALCIUM 8.8* 8.0* 8.0* 8.0* 8.1*  MG  --   --   --  2.0  --    GFR: Estimated Creatinine Clearance: 58.8 mL/min (by C-G formula based on SCr of 0.84 mg/dL). Liver Function Tests:  Recent Labs Lab 11/10/16 1656 11/16/16 0950  AST 24 24  ALT 12* 13*  ALKPHOS 123 119  BILITOT 0.8 0.4  PROT 6.5 4.9*  ALBUMIN 3.0* 2.0*    Recent Labs Lab 11/10/16 1656  LIPASE 24   Recent Results (from the past 240 hour(s))  Culture, body fluid-bottle     Status: None   Collection Time: 11/11/16  3:51 PM  Result Value Ref Range Status   Specimen Description FLUID PERITONEAL  Final   Special Requests BOTTLES DRAWN AEROBIC AND ANAEROBIC 10CC  Final   Culture NO GROWTH 5 DAYS  Final   Report Status 11/16/2016 FINAL  Final  Gram stain     Status: None   Collection Time: 11/11/16  3:51  PM  Result Value Ref Range Status   Specimen Description FLUID PERITONEAL  Final   Special Requests NONE  Final   Gram Stain   Final    MODERATE WBC PRESENT,BOTH PMN AND MONONUCLEAR NO ORGANISMS SEEN    Report Status 11/12/2016 FINAL  Final    Radiology Studies: No results found.  Scheduled Meds: . aspirin  81 mg Oral Daily  . CARBOplatin  500 mg Intravenous Once  . diphenhydrAMINE  50 mg Intravenous Once  . enoxaparin (LOVENOX) injection  80 mg Subcutaneous Q12H  . gabapentin  300 mg Oral Daily  . insulin aspart  0-5 Units Subcutaneous QHS  . insulin aspart  0-9 Units Subcutaneous TID WC  . losartan  50 mg Oral Daily  . metoprolol succinate  25 mg Oral Daily  . nystatin   Topical  TID  . PACLitaxel  175 mg/m2 (Treatment Plan Recorded) Intravenous Once  . palonosetron  0.25 mg Intravenous Once  . pantoprazole  40 mg Oral Q0600   Continuous Infusions: . sodium chloride 75 mL/hr at 11/16/16 1402  . sodium chloride    . sodium chloride    . dexamethasone (DECADRON) IVPB [facility]    . famotidine    . famotidine      LOS: 7 days   Time spent: 25 minutes  Vance Gather, MD [facility] Pager [phone]   If 7PM-7AM, please contact night-coverage [email or web address] Password [facility] 11/17/2016, 11:48 AM

## 2016-11-17 NOTE — NC FL2 (Signed)
[place] LEVEL OF CARE SCREENING TOOL     IDENTIFICATION  Patient Name: Annette Farmer Birthdate: 03-Sep-1945 Sex: female Admission Date (Current Location): 11/10/2016  [facility] and [place] Number:  Herbalist and Address:  [facility],  [address] [place], [place]      Provider Number: [id number]  Attending Physician Name and Address:  Patrecia Pour, MD  Relative Name and Phone Number:       Current Level of Care: Hospital Recommended Level of Care: [place] Prior Approval Number:    Date Approved/Denied:   PASRR Number: [id number]  Discharge Plan: SNF    Current Diagnoses: Patient Active Problem List   Diagnosis Date Noted  . Ovarian cancer on right [facility]): Stage 3C 11/16/2016  . Ileus ([place]) 11/16/2016  . Acute thromboembolism of deep veins of right lower extremity (HCC)   . Port-a-cath in place   . DNR (do not resuscitate)   . Metastatic cancer ([place])   . Palliative care encounter   . Goals of care, counseling/discussion   . Bowel obstruction ([place])   . Severe protein-calorie malnutrition ([place])   . AKI (acute kidney injury) ([place]) 11/11/2016  . Pelvic mass 11/11/2016  . 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 DVT (deep venous thrombosis) ([place]) 11/10/2016  . Refusal of statin medication by patient 09/28/2016  . Hemiparesis affecting right side as late effect of cerebrovascular accident ([place]) 04/23/2012  . Type 2 diabetes mellitus ([place]) 07/20/2006  . HYPERCHOLESTEROLEMIA 07/20/2006  . OBESITY, NOS 07/20/2006  . HYPERTENSION, BENIGN SYSTEMIC 07/20/2006  . HEMORRHOIDS, NOS 07/20/2006  . CONSTIPATION 07/20/2006  . INCONTINENCE, STRESS, FEMALE 07/20/2006  . MENOPAUSAL SYNDROME 07/20/2006    Orientation RESPIRATION BLADDER Height & Weight     Self, Time, Situation, Place  Normal External catheter Weight: 189 lb 9.6 oz (86 kg) Height:  5' 1.5" (156.2 cm)  BEHAVIORAL SYMPTOMS/MOOD NEUROLOGICAL BOWEL NUTRITION  STATUS      Continent Diet  AMBULATORY STATUS COMMUNICATION OF NEEDS Skin   Limited Assist Verbally Normal                       Personal Care Assistance Level of Assistance  Bathing, Feeding, Dressing Bathing Assistance: Limited assistance Feeding assistance: Independent Dressing Assistance: Limited assistance     Functional Limitations Info  Sight, Hearing, Speech Sight Info: Adequate Hearing Info: Adequate Speech Info: Adequate    SPECIAL CARE FACTORS FREQUENCY  PT (By licensed PT), OT (By licensed OT)     PT Frequency: 5x OT Frequency: 5x            Contractures Contractures Info: Not present    Additional Factors Info  Allergies, Code Status Code Status Info: DNR Allergies Info: penicillins           Current Medications (11/17/2016):  This is the current hospital active medication list Current Facility-Administered Medications  Medication Dose Route Frequency Provider Last Rate Last Dose  . 0.9 %  sodium chloride infusion   Intravenous Continuous Fuller Plan A, MD 75 mL/hr at 11/17/16 1539    . 0.9 %  sodium chloride infusion   Intravenous Once PRN Alvy Bimler, Ni, MD      . acetaminophen (TYLENOL) tablet 650 mg  650 mg Oral Q6H PRN Fuller Plan A, MD       Or  . acetaminophen (TYLENOL) suppository 650 mg  650 mg Rectal Q6H PRN Norval Morton, MD      .  albuterol (PROVENTIL) (2.5 MG/3ML) 0.083% nebulizer solution 2.5 mg  2.5 mg Nebulization Q6H PRN Smith, Rondell A, MD      . albuterol (PROVENTIL) (2.5 MG/3ML) 0.083% nebulizer solution 2.5 mg  2.5 mg Nebulization Once PRN Gorsuch, Ni, MD      . alteplase (CATHFLO ACTIVASE) injection 2 mg  2 mg Intracatheter Once PRN Alvy Bimler, Ni, MD      . alum & mag hydroxide-simeth (MAALOX/MYLANTA) 200-200-20 MG/5ML suspension 30 mL  30 mL Oral Q4H PRN Fuller Plan A, MD   30 mL at 11/15/16 0016  . aspirin chewable tablet 81 mg  81 mg Oral Daily Heath Lark, MD   81 mg at 11/17/16 0937  . CARBOplatin (PARAPLATIN)  560 mg in sodium chloride 0.9 % 250 mL chemo infusion  560 mg Intravenous Once Alvy Bimler, Ni, MD      . Cold Pack 1 packet  1 packet Topical Once PRN Alvy Bimler, Ni, MD      . diphenhydrAMINE (BENADRYL) injection 25 mg  25 mg Intravenous Once PRN Alvy Bimler, Ni, MD      . diphenhydrAMINE (BENADRYL) injection 50 mg  50 mg Intravenous Once PRN Alvy Bimler, Ni, MD      . enoxaparin (LOVENOX) injection 85 mg  85 mg Subcutaneous Q12H Emiliano Dyer, RPH      . EPINEPHrine (ADRENALIN) 0.5 mg  0.5 mg Subcutaneous Once PRN Alvy Bimler, Ni, MD      . EPINEPHrine (ADRENALIN) 0.5 mg  0.5 mg Subcutaneous Once PRN Alvy Bimler, Ni, MD      . EPINEPHrine (ADRENALIN) 1 MG/10ML injection 0.25 mg  0.25 mg Intravenous Once PRN Alvy Bimler, Ni, MD      . EPINEPHrine (ADRENALIN) 1 MG/10ML injection 0.25 mg  0.25 mg Intravenous Once PRN Alvy Bimler, Ni, MD      . famotidine (PEPCID) IVPB 20 mg premix  20 mg Intravenous Once PRN Alvy Bimler, Ni, MD      . gabapentin (NEURONTIN) capsule 300 mg  300 mg Oral Daily Hosie Poisson, MD   300 mg at 11/17/16 1454  . gi cocktail (Maalox,Lidocaine,Donnatal)  30 mL Oral TID PRN Hosie Poisson, MD      . guaiFENesin-dextromethorphan (ROBITUSSIN DM) 100-10 MG/5ML syrup 5 mL  5 mL Oral Q4H PRN Hosie Poisson, MD   5 mL at 11/15/16 0616  . heparin lock flush 100 unit/mL  500 Units Intracatheter Once PRN Alvy Bimler, Ni, MD      . heparin lock flush 100 unit/mL  250 Units Intracatheter Once PRN Alvy Bimler, Ni, MD      . insulin aspart (novoLOG) injection 0-5 Units  0-5 Units Subcutaneous QHS Smith, Rondell A, MD      . insulin aspart (novoLOG) injection 0-9 Units  0-9 Units Subcutaneous TID WC Fuller Plan A, MD   2 Units at 11/17/16 1243  . lidocaine (XYLOCAINE) 1 % (with pres) injection    PRN Monia Sabal, PA-C   10 mL at 11/11/16 1541  . losartan (COZAAR) tablet 50 mg  50 mg Oral Daily Hosie Poisson, MD   50 mg at 11/17/16 1454  . methylPREDNISolone sodium succinate (SOLU-MEDROL) 125 mg/2 mL injection 125 mg  125  mg Intravenous Once PRN Alvy Bimler, Ni, MD      . metoprolol succinate (TOPROL-XL) 24 hr tablet 25 mg  25 mg Oral Daily Hosie Poisson, MD   25 mg at 11/17/16 1454  . morphine 2 MG/ML injection 1-2 mg  1-2 mg Intravenous Q4H PRN Hosie Poisson, MD   2 mg at 11/17/16  1536  . nystatin (MYCOSTATIN/NYSTOP) topical powder   Topical TID Fuller Plan A, MD      . ondansetron (ZOFRAN) tablet 4 mg  4 mg Oral Q6H PRN Fuller Plan A, MD       Or  . ondansetron (ZOFRAN) injection 4 mg  4 mg Intravenous Q6H PRN Fuller Plan A, MD   4 mg at 11/17/16 0937  . PACLitaxel (TAXOL) 336 mg in dextrose 5 % 500 mL chemo infusion (> 80mg /m2)  175 mg/m2 (Order-Specific) Intravenous Once Heath Lark, MD 185 mL/hr at 11/17/16 1547 336 mg at 11/17/16 1547  . pantoprazole (PROTONIX) EC tablet 40 mg  40 mg Oral Q0600 Hosie Poisson, MD   40 mg at 11/16/16 0600  . promethazine (PHENERGAN) injection 12.5 mg  12.5 mg Intravenous Q6H PRN Hosie Poisson, MD      . sodium chloride flush (NS) 0.9 % injection 10 mL  10 mL Intracatheter PRN Gorsuch, Ni, MD      . sodium chloride flush (NS) 0.9 % injection 3 mL  3 mL Intravenous PRN Alvy Bimler, Ni, MD      . traMADol (ULTRAM) tablet 50 mg  50 mg Oral Q6H PRN Fuller Plan A, MD   50 mg at 11/16/16 0831     Discharge Medications: Please see discharge summary for a list of discharge medications.  Relevant Imaging Results:  Relevant Lab Results:   Additional Information SS# [id number]  Nila Nephew, LCSW

## 2016-11-17 NOTE — Progress Notes (Signed)
No charge note.    Per close friend in [place] meeting.  Annette Farmer has consented to go to SNF (when medically appropriate) for acute rehab.    Her 1st choice of SNF is [facility].   She would prefer not to return to [facility] where she went after her CVA.  Imogene Burn, [place] [facility] Pager: [phone]

## 2016-11-17 NOTE — Progress Notes (Signed)
I spoke with the interventional radiologist. Due to scheduling issue, the patient is unlikely able to get port placement today. I recommend we proceed with chemotherapy using her peripheral IV access today and will arrange for port placement in the outpatient prior to second cycle of chemo I have spoken with the nursing staff and relayed this message. I also recommend switching her anticoagulation therapy to Lovenox if possible but would defer to primary service to consider whether this is appropriate.

## 2016-11-18 ENCOUNTER — Inpatient Hospital Stay (HOSPITAL_COMMUNITY): Payer: Medicare PPO

## 2016-11-18 ENCOUNTER — Other Ambulatory Visit: Payer: Self-pay | Admitting: Hematology and Oncology

## 2016-11-18 DIAGNOSIS — Z86718 Personal history of other venous thrombosis and embolism: Secondary | ICD-10-CM

## 2016-11-18 DIAGNOSIS — R188 Other ascites: Secondary | ICD-10-CM

## 2016-11-18 LAB — GLUCOSE, CAPILLARY
GLUCOSE-CAPILLARY: 130 mg/dL — AB (ref 65–99)
Glucose-Capillary: 148 mg/dL — ABNORMAL HIGH (ref 65–99)
Glucose-Capillary: 112 mg/dL — ABNORMAL HIGH (ref 65–99)
Glucose-Capillary: 135 mg/dL — ABNORMAL HIGH (ref 65–99)

## 2016-11-18 LAB — URINALYSIS, ROUTINE W REFLEX MICROSCOPIC
BILIRUBIN URINE: NEGATIVE
GLUCOSE, UA: NEGATIVE mg/dL
KETONES UR: NEGATIVE mg/dL
NITRITE: NEGATIVE
PH: 6 (ref 5.0–8.0)
Protein, ur: NEGATIVE mg/dL
Specific Gravity, Urine: 1.006 (ref 1.005–1.030)

## 2016-11-18 NOTE — Progress Notes (Addendum)
PROGRESS NOTE    Annette Farmer  JKD:[id number] DOB: 02-Nov-1945 DOA: 11/10/2016 PCP: Wardell Honour, MD    Brief Narrative: Patient is a 71 year old female who presented to PCPs office 11/05/2016 with right lower extremity edema right hip pain after a fall. Patient noted to have chronic right-sided weakness secondary to a prior stroke. Doppler ultrasounds done concerning for extensive bilateral lower extremity DVT. CT angiogram was done which was negative for PE but CT abdomen did show extensive nodular implants consistent with carcinomatosis along with pelvic mass. Patient also noted to have ascites. On 11/11/2016 patient underwent ultrasound-guided paracentesis with cytology positive for adenocarcinoma. Concern for primary GYN pathology. Patient also noted to have extensive DVT felt likely secondary to untreated cancer. Vascular surgery and IR consulted. General surgery was also consulted due to concerns for small bowel obstruction versus ileus. Patient had a bout of hematuria have a hemoglobin seems stable. Patient seen by GYN oncology and hematology oncology were recommending neoadjuvant chemotherapy as soon as possible versus debulking. Patient was transferred to [facility] for Port-A-Cath placement and to start neoadjuvant chemotherapy as soon as possible. Port placement is delayed, so chemotherapy will be given through peripheral IV 6/28.  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[place])   Metastatic cancer (HCC)   Palliative care encounter   Goals of care, counseling/discussion   Bowel obstruction (HCC)   Severe protein-calorie malnutrition ([place])   Ileus (HCC)   Acute thromboembolism of deep veins of right lower extremity (HCC)   Port-a-cath in  place   DNR (do not resuscitate)  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 Patient underwent paracentesis and cytology consistent with adenocarcinoma likely from probable right ovarian cancer.  - Continue anticoagulation with lovenox per heme/onc recommendations - No surgical debulking planned at this time - Received neoadjuvant chemotherapy 6/28 through peripheral IV. Oncology to schedule outpatient port placement prior to next cycle. Treatment has curative intent. - Palliative care team has met with patient, feels she has low insight into condition. Will follow as outpatient.   Ileus: Felt likely secondary to recently diagnosed stage IIIc ovarian cancer with peritoneal carcinomatosis.  - Observation recommended by general surgery. Improving overall, passing gas but unable to keep food down for now. Check KUB.  - Advance diet as tolerated. May have to return to liquids.  - Oncology feels symptoms will improve with treatment.   Extensive RLE DVT: Likely secondary to stage IIIc untreated right ovarian cancer. Initially it was felt patient may need IVC filter placed due to concerns for hematuria, though hgb stable. Vascular surgery was consulted and it was felt patient did not need a thrombectomy at this time.  - Lovenox and possibly NOAC per hematology/oncology recommendations.  Hematuria: Ongoing.  - Will need urology consultation/cystoscopy at some point. I've discussed with urology, Dr. Jeffie Pollock who will evaluate the patient as an inpatient.   Well-controlled diabetes mellitus: Hemoglobin A1c 5.9 on 09/28/2016.  - Monitor closely with decadron given with chemo. At  inpatient  goal. - Continue SSI  Acute kidney injury: Felt to be secondary to a prerenal azotemia secondary to volume depletion and dehydration. Renal function improved and currently resolved.  GERD: Chronic, worse with carcinomatosis - Continue acid suppression  Essential HTN: Stable.  - Continue Toprol-XL. Blood pressure seems borderline will discontinue Cozaar.  DVT prophylaxis: Lovenox Code Status: Full Family Communication: No family at bedside.  Disposition Plan: Continued management of obstructive GI symptoms. Will need SNF for safe discharge once able to take po.   Consultants:   Palliative care Dr. Rowe Pavy 11/15/2016  Oncology: Dr. Alvy Bimler 11/15/2016  Interventional radiology: Dr. Pascal Lux 11/14/2016  Gen. surgery: Dr. Donne Hazel 11/14/2016   GYN oncology: Dr. Denman George 11/15/2016/Dr. Delsa Sale 11/11/2016  Vascular surgery Dr. Donzetta Matters 11/12/2016   Procedures:   Lower extremity Dopplers 11/10/2016  CT angiogram chest 11/10/2016  CT abdomen and pelvis 11/10/2016  Paracentesis for intervention radiology 11/11/2016--per Dr. Geroge Baseman successful ultrasound-guided paracentesis yielding 420 mL of peritoneal fluid.---   chest x-ray 11/10/2016  Abdominal films 11/14/2016, 11/15/2016  Antimicrobials:   None  Subjective: Threw up gastric contents x2 attempts in past 24 hours, but remains hungry. Denies significant nausea, tolerated chemo without significant symptoms.   Objective: BP (!) 148/89 (BP Location: Right Arm)   Pulse 77   Temp 97.6 F (36.4 C) (Oral)   Resp 16   Ht 5' 1.5" (1.562 m)   Wt 86 kg (189 lb 9.6 oz)   SpO2 96%   BMI 35.24 kg/m   General exam: Calm 71yo F in no distress sitting in chair Respiratory system: Nonlabored and clear Cardiovascular system: RRR. No JVD, murmurs, or edema. Gastrointestinal system: Soft, distended but not tympanitic. No tenderness.  Central nervous system: Alert and oriented. No focal neurological deficits. Extremities: No  deformities. Skin: No rashes, lesions or ulcers Psychiatry: Judgement and insight appear fair. Mood & affect appropriate. Prone to agitation.  CBC:  Recent Labs Lab 11/13/16 0535 11/14/16 0249 11/15/16 0305 11/16/16 0334 11/17/16 0355  WBC 7.7 7.5 8.5 7.7 7.3  HGB 11.9* 11.7* 12.1 12.8 12.2  HCT 37.6 36.2 37.9 39.5 36.0  MCV 91.5 90.0 90.0 92.5 88.7  PLT 275 285 309 266 161   Basic Metabolic Panel:  Recent Labs Lab 11/12/16 1423 11/14/16 1244 11/16/16 0950 11/17/16 0355  NA 135 135 134* 134*  K 3.9 4.3 4.4 4.1  CL 106 107 108 105  CO2 22 22 19* 21*  GLUCOSE 175* 148* 125* 116*  BUN '15 10 13 17  ' CREATININE 0.89 0.72 0.81 0.84  CALCIUM 8.0* 8.0* 8.0* 8.1*  MG  --   --  2.0  --    GFR: Estimated Creatinine Clearance: 61 mL/min (by C-G formula based on SCr of 0.84 mg/dL). Liver Function Tests:  Recent Labs Lab 11/16/16 0950  AST 24  ALT 13*  ALKPHOS 119  BILITOT 0.4  PROT 4.9*  ALBUMIN 2.0*   No results for input(s): LIPASE, AMYLASE in the last 168 hours. Recent Results (from the past 240 hour(s))  Culture, body fluid-bottle     Status: None   Collection Time: 11/11/16  3:51 PM  Result Value Ref Range Status   Specimen Description FLUID PERITONEAL  Final   Special Requests BOTTLES DRAWN AEROBIC AND ANAEROBIC 10CC  Final   Culture NO GROWTH 5 DAYS  Final   Report Status 11/16/2016 FINAL  Final  Gram stain     Status: None   Collection Time: 11/11/16  3:51 PM  Result Value Ref  Range Status   Specimen Description FLUID PERITONEAL  Final   Special Requests NONE  Final   Gram Stain   Final    MODERATE WBC PRESENT,BOTH PMN AND MONONUCLEAR NO ORGANISMS SEEN    Report Status 11/12/2016 FINAL  Final    Radiology Studies: No results found.  Scheduled Meds: . aspirin  81 mg Oral Daily  . enoxaparin (LOVENOX) injection  85 mg Subcutaneous Q12H  . gabapentin  300 mg Oral Daily  . insulin aspart  0-5 Units Subcutaneous QHS  . insulin aspart  0-9 Units  Subcutaneous TID WC  . losartan  50 mg Oral Daily  . metoprolol succinate  25 mg Oral Daily  . nystatin   Topical TID  . pantoprazole  40 mg Oral Q0600   Continuous Infusions: . sodium chloride 75 mL/hr at 11/18/16 1143  . sodium chloride    . famotidine      LOS: 8 days   Time spent: 25 minutes  Vance Gather, MD [facility] Pager [phone]   If 7PM-7AM, please contact night-coverage [email or web address] Password [email or web address] 11/18/2016, 1:35 PM

## 2016-11-18 NOTE — Consult Note (Signed)
Subjective: CC: Hematuria  Hx: I was asked to see [name] in consultation by Dr. Bonner Puna for hematuria.   She is a 71 yo WF with newly diagnosed ovarian cancer who is admitted with a PE and is on anticoagulation with lovenox.   She has a history of incontinence and is being managed with an external suction catheter.  She has had gross hematuria on the lovenox but the urine is clearing. She has no anemia.  The UA had no signs of UTI.  Her recent CT shows possible invasion of the tumor mass into the bladder.   She has chronic incontinence following a stroke in 2012 and uses pads for management at home.  She has no other GU history.   ROS:  Review of Systems  Constitutional: Negative for fever.  Cardiovascular: Positive for leg swelling.  Gastrointestinal: Positive for abdominal pain.  Genitourinary: Positive for hematuria and urgency. Negative for dysuria and flank pain.  All other systems reviewed and are negative.   Allergies  Allergen Reactions  . Penicillins Nausea Only    Has patient had a PCN reaction causing immediate rash, facial/tongue/throat swelling, SOB or lightheadedness with hypotension: NO Has patient had a PCN reaction causing severe rash involving mucus membranes or skin necrosis: NO Has patient had a PCN reaction that required hospitalization: NO Has patient had a PCN reaction occurring within the last 10 years: NO If all of the above answers are "NO", then may proceed with Cephalosporin use.     Past Medical History:  Diagnosis Date  . Depression   . Diabetes mellitus without complication ([place])   . Hypertension   . Stroke [facility])     Past Surgical History:  Procedure Laterality Date  . HERNIA REPAIR    . IR PARACENTESIS  11/11/2016  . UMBILICAL HERNIA REPAIR      Social History   Social History  . Marital status: Single    Spouse name: N/A  . Number of children: N/A  . Years of education: N/A   Occupational History  . Not on file.   Social History  Main Topics  . Smoking status: Never Smoker  . Smokeless tobacco: Never Used  . Alcohol use No  . Drug use: No  . Sexual activity: No   Other Topics Concern  . Not on file   Social History Narrative   Marital status: single; not dating      Children: none      Live: alone      Employment: retired in 1997; Corporate treasurer      Tobacco: never      Alcohol: drinks "a little" 3 bottles of liquor and 3 bottles of wine      Exercise: none in 2018    Family History  Problem Relation Age of Onset  . Parkinson's disease Mother   . Dementia Father   . Cancer Sister 72       Breast cancer    Anti-infectives: Anti-infectives    None      Current Facility-Administered Medications  Medication Dose Route Frequency Provider Last Rate Last Dose  . 0.9 %  sodium chloride infusion   Intravenous Continuous Fuller Plan A, MD 75 mL/hr at 11/18/16 1143    . 0.9 %  sodium chloride infusion   Intravenous Once PRN Alvy Bimler, Ni, MD      . acetaminophen (TYLENOL) tablet 650 mg  650 mg Oral Q6H PRN Norval Morton, MD       Or  .  acetaminophen (TYLENOL) suppository 650 mg  650 mg Rectal Q6H PRN Smith, Rondell A, MD      . albuterol (PROVENTIL) (2.5 MG/3ML) 0.083% nebulizer solution 2.5 mg  2.5 mg Nebulization Q6H PRN Smith, Rondell A, MD      . albuterol (PROVENTIL) (2.5 MG/3ML) 0.083% nebulizer solution 2.5 mg  2.5 mg Nebulization Once PRN Gorsuch, Ni, MD      . alteplase (CATHFLO ACTIVASE) injection 2 mg  2 mg Intracatheter Once PRN Heath Lark, MD      . alum & mag hydroxide-simeth (MAALOX/MYLANTA) 200-200-20 MG/5ML suspension 30 mL  30 mL Oral Q4H PRN Fuller Plan A, MD   30 mL at 11/17/16 2123  . aspirin chewable tablet 81 mg  81 mg Oral Daily Alvy Bimler, Ni, MD   81 mg at 11/18/16 0953  . diphenhydrAMINE (BENADRYL) injection 25 mg  25 mg Intravenous Once PRN Alvy Bimler, Ni, MD      . diphenhydrAMINE (BENADRYL) injection 50 mg  50 mg Intravenous Once PRN Alvy Bimler, Ni, MD      . enoxaparin  (LOVENOX) injection 85 mg  85 mg Subcutaneous Q12H Emiliano Dyer, RPH   85 mg at 11/18/16 1138  . EPINEPHrine (ADRENALIN) 0.5 mg  0.5 mg Subcutaneous Once PRN Alvy Bimler, Ni, MD      . EPINEPHrine (ADRENALIN) 0.5 mg  0.5 mg Subcutaneous Once PRN Alvy Bimler, Ni, MD      . EPINEPHrine (ADRENALIN) 1 MG/10ML injection 0.25 mg  0.25 mg Intravenous Once PRN Alvy Bimler, Ni, MD      . EPINEPHrine (ADRENALIN) 1 MG/10ML injection 0.25 mg  0.25 mg Intravenous Once PRN Alvy Bimler, Ni, MD      . famotidine (PEPCID) IVPB 20 mg premix  20 mg Intravenous Once PRN Alvy Bimler, Ni, MD      . gabapentin (NEURONTIN) capsule 300 mg  300 mg Oral Daily Hosie Poisson, MD   300 mg at 11/18/16 1511  . gi cocktail (Maalox,Lidocaine,Donnatal)  30 mL Oral TID PRN Hosie Poisson, MD      . guaiFENesin-dextromethorphan (ROBITUSSIN DM) 100-10 MG/5ML syrup 5 mL  5 mL Oral Q4H PRN Hosie Poisson, MD   5 mL at 11/15/16 0616  . heparin lock flush 100 unit/mL  500 Units Intracatheter Once PRN Alvy Bimler, Ni, MD      . heparin lock flush 100 unit/mL  250 Units Intracatheter Once PRN Alvy Bimler, Ni, MD      . insulin aspart (novoLOG) injection 0-5 Units  0-5 Units Subcutaneous QHS Smith, Rondell A, MD      . insulin aspart (novoLOG) injection 0-9 Units  0-9 Units Subcutaneous TID WC Fuller Plan A, MD   1 Units at 11/18/16 1233  . lidocaine (XYLOCAINE) 1 % (with pres) injection    PRN Monia Sabal, PA-C   10 mL at 11/11/16 1541  . losartan (COZAAR) tablet 50 mg  50 mg Oral Daily Hosie Poisson, MD   50 mg at 11/18/16 1511  . methylPREDNISolone sodium succinate (SOLU-MEDROL) 125 mg/2 mL injection 125 mg  125 mg Intravenous Once PRN Alvy Bimler, Ni, MD      . metoprolol succinate (TOPROL-XL) 24 hr tablet 25 mg  25 mg Oral Daily Hosie Poisson, MD   25 mg at 11/18/16 1511  . morphine 2 MG/ML injection 1-2 mg  1-2 mg Intravenous Q4H PRN Hosie Poisson, MD   2 mg at 11/18/16 0227  . nystatin (MYCOSTATIN/NYSTOP) topical powder   Topical TID Norval Morton, MD       . ondansetron (  ZOFRAN) tablet 4 mg  4 mg Oral Q6H PRN Fuller Plan A, MD       Or  . ondansetron (ZOFRAN) injection 4 mg  4 mg Intravenous Q6H PRN Fuller Plan A, MD   4 mg at 11/17/16 2325  . pantoprazole (PROTONIX) EC tablet 40 mg  40 mg Oral Q0600 Hosie Poisson, MD   40 mg at 11/18/16 0821  . promethazine (PHENERGAN) injection 12.5 mg  12.5 mg Intravenous Q6H PRN Hosie Poisson, MD      . sodium chloride flush (NS) 0.9 % injection 10 mL  10 mL Intracatheter PRN Gorsuch, Ni, MD      . sodium chloride flush (NS) 0.9 % injection 3 mL  3 mL Intravenous PRN Alvy Bimler, Ni, MD      . traMADol (ULTRAM) tablet 50 mg  50 mg Oral Q6H PRN Fuller Plan A, MD   50 mg at 11/16/16 5701   Past medical, surgical, social and family history reviewed.   Objective: Vital signs in last 24 hours: Temp:  [97.6 F (36.4 C)-98 F (36.7 C)] 97.7 F (36.5 C) (06/29 1400) Pulse Rate:  [66-77] 71 (06/29 1400) Resp:  [14-16] 15 (06/29 1400) BP: (136-148)/(72-89) 140/72 (06/29 1400) SpO2:  [95 %-100 %] 100 % (06/29 1400)  Intake/Output from previous day: 06/28 0701 - 06/29 0700 In: 2286 [P.O.:480; I.V.:1200; IV Piggyback:606] Out: 1000 [Urine:1000] Intake/Output this shift: Total I/O In: 240 [P.O.:240] Out: 1000 [Urine:1000]   Physical Exam  Constitutional: She is oriented to person, place, and time.  WD, Obese WF in NAD A/O x 3.  HENT:  Head: Normocephalic and atraumatic.  Neck: Normal range of motion. Neck supple.  No cervical or supraclavicular adenopathy.   Cardiovascular: Normal rate and regular rhythm.   Pulmonary/Chest: Effort normal and breath sounds normal. No respiratory distress.  Abdominal:  Soft, obese with mild RLQ tenderness but no mass or hernia.   Musculoskeletal: Normal range of motion. She exhibits edema (mild in the right LE. ).  Neurological: She is alert and oriented to person, place, and time.  Skin: Skin is warm and dry.  Psychiatric: Mood and affect normal.  Vitals  reviewed.   Lab Results:   Recent Labs  11/16/16 0334 11/17/16 0355  WBC 7.7 7.3  HGB 12.8 12.2  HCT 39.5 36.0  PLT 266 337   BMET  Recent Labs  11/16/16 0950 11/17/16 0355  NA 134* 134*  K 4.4 4.1  CL 108 105  CO2 19* 21*  GLUCOSE 125* 116*  BUN 13 17  CREATININE 0.81 0.84  CALCIUM 8.0* 8.1*   PT/INR No results for input(s): LABPROT, INR in the last 72 hours. ABG No results for input(s): PHART, HCO3 in the last 72 hours.  Invalid input(s): PCO2, PO2  Studies/Results: Dg Abd 1 View  Result Date: 11/18/2016 CLINICAL DATA:  Followup ileus or partial small bowel obstruction. Ovarian cancer. EXAM: ABDOMEN - 1 VIEW COMPARISON:  11/15/2016. FINDINGS: Normal bowel gas pattern.  Unremarkable bones. IMPRESSION: No acute abnormality. Electronically Signed   By: Claudie Revering M.D.   On: 11/18/2016 15:02   I have discussed her case with Dr. Bonner Puna and have reviewed the pertinent labs as well as her CT films from 6/21 and the report.    Assessment: Gross hematuria on anticoagulation that appears to be secondary to bladder invasion from the right pelvic mass.   The hematuria has abated so I would continue the lovenox for now.  If the hematuria becomes more problematic,  she may need to be considered for a [place] filter in place of anticoagulation and cystoscopy with fulguration, but I would hold off on that for now.  Chronic but worsening incontinence probably aggravated by the right pelvic mass.  She is doing well with the external catheter.    CC: Dr. Vance Gather.      Annette Farmer J 11/18/2016 [phone]

## 2016-11-18 NOTE — Progress Notes (Signed)
F/u to provide support to patient and family. Denies pain or discomfort. Walked with physical therapy today. Tolerated lunch. No questions or concerns during visit. Reassured continued support from palliative next week if patient still hospitalized. She has my contact information.    NO CHARGE  Ihor Dow, FNP-C Palliative Medicine Team  Phone: [phone] Fax: [phone]

## 2016-11-18 NOTE — Progress Notes (Signed)
Nursing Note: Pt had large emesis,brown-green,un-digested food.Pt says she could tell that it was from her dinner.Medicated w. zofran IV.wbb

## 2016-11-18 NOTE — Care Management Important Message (Signed)
Important Message  Patient Details  Name: Annette Farmer MRN: [id number] Date of Birth: September 16, 1945   Medicare Important Message Given:  Yes    [name] 11/18/2016, 9:26 AMImportant Message  Patient Details  Name: Annette Farmer MRN: [id number] Date of Birth: [birth year]   Medicare Important Message Given:  Yes    [name] 11/18/2016, 9:26 AM

## 2016-11-18 NOTE — Progress Notes (Signed)
Met with pt to discuss DC plan.  Pt has expressed she is now agreeable to SNF and prefers [facility] (states she will go home if [facility] unavailable). Obtained PASSR, completed FL2, made referral. Pt accepted to [facility] and CSW obtained insurance authorization # [id number] [place] [facility].  Provided this information to facility.   Plan: Transfer to [place] [facility] at DC.  Sharren Bridge, MSW, LCSW Clinical Social Work 11/18/2016 [phone]

## 2016-11-18 NOTE — Progress Notes (Signed)
Physical Therapy Treatment Patient Details Name: Annette Farmer MRN: [id number] DOB: [birth year] Today's Date: 11/18/2016    History of Present Illness Pt is a 71 y.o. female with medical history significant of HTN, DM type II, CVA, and depression. She presented with complaints of right leg swelling over the last 1 week.  Ultrasound revealed extensive clot RLE. Abdominal CT revealed right adnexal tumour with carcinomatosis. Plan for aracentesis on 6/29; cytology is still pending; however, she will likely require an omental biopsy for diagnosis. Palliative care mtg 6/27.    PT Comments    Assisted pt OOB to [facility] then amb a limited distance in hallway.  Very unsteady gait with need for RW.  HIGH FALL RISK.  Prior pt amb with her cane and she lives home alone.   Pt progressing slowly due to multiple medical issues.   Follow Up Recommendations  SNF (per chart review)     Equipment Recommendations  None recommended by PT    Recommendations for Other Services       Precautions / Restrictions Precautions Precautions: Fall Precaution Comments: CHEMO Restrictions Weight Bearing Restrictions: No    Mobility  Bed Mobility Overal bed mobility: Needs Assistance Bed Mobility: Supine to Sit   Sidelying to sit: Supervision;HOB elevated       General bed mobility comments: HOB elevated and use of rail pt able to self perform  Transfers Overall transfer level: Needs assistance Equipment used: None Transfers: Sit to/from [employer] Transfers Sit to Stand: Supervision;Min guard Stand pivot transfers: Supervision;Min guard       General transfer comment: assisted from elevated bed to [facility] then back no AD and only one VC safety with turn completion.  Ambulation/Gait Ambulation/Gait assistance: Min guard;Min assist Ambulation Distance (Feet): 42 Feet Assistive device: Rolling walker (2 wheeled) Gait Pattern/deviations: Drifts right/left;Step-through pattern;Decreased stride  length;Staggering left;Staggering right Gait velocity: decreased   General Gait Details: very unsteady gait with lateral sway and limited distance due to fatigue and weakness.     Stairs            Wheelchair Mobility    Modified Rankin (Stroke Patients Only)       Balance                                            Cognition Arousal/Alertness: Awake/alert Behavior During Therapy: WFL for tasks assessed/performed Overall Cognitive Status: Within Functional Limits for tasks assessed                                 General Comments: pt lives home alone and has her way of doing things.        Exercises      General Comments        Pertinent Vitals/Pain      Home Living                      Prior Function            PT Goals (current goals can now be found in the care plan section) Progress towards PT goals: Progressing toward goals    Frequency    Min 3X/week      PT Plan Current plan remains appropriate    Co-evaluation  AM-PAC PT "6 Clicks" Daily Activity  Outcome Measure  Difficulty turning over in bed (including adjusting bedclothes, sheets and blankets)?: Total Difficulty moving from lying on back to sitting on the side of the bed? : Total Difficulty sitting down on and standing up from a chair with arms (e.g., wheelchair, bedside commode, etc,.)?: Total Help needed moving to and from a bed to chair (including a wheelchair)?: A Lot Help needed walking in hospital room?: A Lot Help needed climbing 3-5 steps with a railing? : Total 6 Click Score: 8    End of Session Equipment Utilized During Treatment: Gait belt Activity Tolerance: Patient limited by fatigue Patient left: in chair;with call bell/phone within reach   PT Visit Diagnosis: Unsteadiness on feet (R26.81);History of falling (Z91.81);Muscle weakness (generalized) (M62.81)     Time: 2423-5361 PT Time Calculation (min)  (ACUTE ONLY): 24 min  Charges:  $Gait Training: 8-22 mins $Therapeutic Activity: 8-22 mins                    G Codes:      Rica Koyanagi  PTA [facility]  Acute  Rehab Pager      [phone]

## 2016-11-18 NOTE — Progress Notes (Signed)
Annette Farmer   DOB:Feb 16, 1946   KT#:[id number]    Subjective: The patient had vomiting early in the morning from undigested food.  She denies nausea this morning.  She tolerated chemotherapy without significant hyperglycemia The patient made inappropriate comments about incorrect information on charting about a friend and charting about her HPI upon admission.  She is still very much against the idea of going into SNF. She is also making unpleasant comments about me taking some time off next week  Objective:  Vitals:   11/17/16 2014 11/18/16 0510  BP: 136/81 (!) 148/89  Pulse: 66 77  Resp: 14 16  Temp: 98 F (36.7 C) 97.6 F (36.4 C)     Intake/Output Summary (Last 24 hours) at 11/18/16 3428 Last data filed at 11/18/16 0700  Gross per 24 hour  Intake             2286 ml  Output             1000 ml  Net             1286 ml    GENERAL:alert, no distress and comfortable SKIN: skin color, texture, turgor are normal, no rashes or significant lesions EYES: normal, Conjunctiva are pink and non-injected, sclera clear Musculoskeletal:no cyanosis of digits and no clubbing  NEURO: alert & oriented x 3 with fluent speech, no focal motor/sensory deficits   Labs:  Lab Results  Component Value Date   WBC 7.3 11/17/2016   HGB 12.2 11/17/2016   HCT 36.0 11/17/2016   MCV 88.7 11/17/2016   PLT 337 11/17/2016   NEUTROABS 6.8 11/10/2016    Lab Results  Component Value Date   NA 134 (L) 11/17/2016   K 4.1 11/17/2016   CL 105 11/17/2016   CO2 21 (L) 11/17/2016    Assessment & Plan:   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 I agree with neoadjuvant chemotherapy approach She appears very frail and is unlikely able to withstand primary surgical resection especially with recent diagnosis of extensive DVT, making primary debulking surgery somewhat risky  She received cycle 1 of treatment on 11/17/2016 Due to hospitalization, I am not able to prescribe  Neulasta support I will prescribe Neulasta when I see her back next week after discharge I will arrange port placement in the outpatient setting  Mild GI obstructive symptoms/ileus This is likely due to carcinomatosis Continue conservative approach Continue to advance her diet as tolerated  Extensive DVT Due to untreated cancer I do not feel it is necessary for IVC filter placement Consider transition to Lovenox   Strong family history of cancer I would refer her to see genetic counselor after discharge  Diabetes and history of stroke The patient would be at high risk of severe hyperglycemia after treatment I recommend first dose of chemo to be given in the inpatient setting with close monitoring of her blood sugar afterwards I will reduce her baseline aspirin therapy from 325 mg daily to 81 mg daily  Poor social circumstances  The patient is very adamant she would not go to skilled nursing facility She claims to have close friends at church who would be willing to provide transportation back and forth to the cancer center for treatment Please see documentation by palliative care team dated 11/16/2016. [employer] friends have significant concerns about safety being at home We will continue to work with palliative care team and case management for discharge planning  Goals of care Her disease is highly treatable, potentially curable  with aggressive management The patient has living will at home Annette Farmer is changed to DNR per discussion with palliative care service  Discharge planning She cannot be discharged due to unresolved bowel obstructive symptoms The patient is made aware that outpatient follow-up will be arranged in the future If she is still hospitalized next week, return to see her in the hospital In the meantime, if questions arise, please call consult service  Heath Lark, MD 11/18/2016  8:32 AM

## 2016-11-19 ENCOUNTER — Inpatient Hospital Stay (HOSPITAL_COMMUNITY): Payer: Medicare PPO

## 2016-11-19 DIAGNOSIS — M7989 Other specified soft tissue disorders: Secondary | ICD-10-CM

## 2016-11-19 LAB — BASIC METABOLIC PANEL
Anion gap: 8 (ref 5–15)
BUN: 17 mg/dL (ref 6–20)
CALCIUM: 8 mg/dL — AB (ref 8.9–10.3)
CO2: 22 mmol/L (ref 22–32)
Chloride: 107 mmol/L (ref 101–111)
Creatinine, Ser: 0.75 mg/dL (ref 0.44–1.00)
GLUCOSE: 109 mg/dL — AB (ref 65–99)
Potassium: 4.4 mmol/L (ref 3.5–5.1)
Sodium: 137 mmol/L (ref 135–145)

## 2016-11-19 LAB — GLUCOSE, CAPILLARY
GLUCOSE-CAPILLARY: 121 mg/dL — AB (ref 65–99)
GLUCOSE-CAPILLARY: 138 mg/dL — AB (ref 65–99)
Glucose-Capillary: 104 mg/dL — ABNORMAL HIGH (ref 65–99)
Glucose-Capillary: 119 mg/dL — ABNORMAL HIGH (ref 65–99)

## 2016-11-19 LAB — CBC
HCT: 38.6 % (ref 36.0–46.0)
Hemoglobin: 13 g/dL (ref 12.0–15.0)
MCH: 29.5 pg (ref 26.0–34.0)
MCHC: 33.7 g/dL (ref 30.0–36.0)
MCV: 87.7 fL (ref 78.0–100.0)
PLATELETS: 312 10*3/uL (ref 150–400)
RBC: 4.4 MIL/uL (ref 3.87–5.11)
RDW: 14.4 % (ref 11.5–15.5)
WBC: 7.2 10*3/uL (ref 4.0–10.5)

## 2016-11-19 MED ORDER — SODIUM CHLORIDE 0.9 % IV BOLUS (SEPSIS)
500.0000 mL | Freq: Once | INTRAVENOUS | Status: AC
  Administered 2016-11-19: 500 mL via INTRAVENOUS

## 2016-11-19 MED ORDER — DEXTROSE-NACL 5-0.45 % IV SOLN
INTRAVENOUS | Status: DC
  Administered 2016-11-19 – 2016-11-25 (×8): via INTRAVENOUS

## 2016-11-19 NOTE — Progress Notes (Signed)
BP 90/48 Losartan and metoprolol held Dr. Auburn Bilberry made aware , ok to hold neds for now. Nurse to reassess bp and notify MD

## 2016-11-19 NOTE — Progress Notes (Signed)
Assessment and Plan: Gross hematuria has resolved and was probably secondary to bladder invasion by Gyn malignancy and anticoagulation for DVT.  I will sign off for now.   Please reconsult if the bleeding recurs.   I don't think cystoscopy is indicated at this time.     Subjective: CC: Hematuria  Hx:  The hematuria has cleared and she continues to have excellent UOP.  She has chronic incontinence but no other GU complaints.  She is complaining of bilateral LE edema where she had just right LE edema on admission and she is having some knee pain.  Her Hgb is 13.    ROS:  Review of Systems  Constitutional: Negative for fever.  Respiratory: Negative for shortness of breath.   Cardiovascular: Positive for leg swelling. Negative for chest pain.  Gastrointestinal: Negative for nausea.    Anti-infectives: Anti-infectives    None      Current Facility-Administered Medications  Medication Dose Route Frequency Provider Last Rate Last Dose  . 0.9 %  sodium chloride infusion   Intravenous Once PRN Heath Lark, MD      . acetaminophen (TYLENOL) tablet 650 mg  650 mg Oral Q6H PRN Norval Morton, MD       Or  . acetaminophen (TYLENOL) suppository 650 mg  650 mg Rectal Q6H PRN Smith, Rondell A, MD      . albuterol (PROVENTIL) (2.5 MG/3ML) 0.083% nebulizer solution 2.5 mg  2.5 mg Nebulization Q6H PRN Smith, Rondell A, MD      . alum & mag hydroxide-simeth (MAALOX/MYLANTA) 200-200-20 MG/5ML suspension 30 mL  30 mL Oral Q4H PRN Fuller Plan A, MD   30 mL at 11/19/16 0018  . aspirin chewable tablet 81 mg  81 mg Oral Daily Alvy Bimler, Ni, MD   81 mg at 11/18/16 0953  . enoxaparin (LOVENOX) injection 85 mg  85 mg Subcutaneous Q12H Emiliano Dyer, RPH   85 mg at 11/19/16 0018  . gabapentin (NEURONTIN) capsule 300 mg  300 mg Oral Daily Hosie Poisson, MD   300 mg at 11/18/16 1511  . gi cocktail (Maalox,Lidocaine,Donnatal)  30 mL Oral TID PRN Hosie Poisson, MD      . guaiFENesin-dextromethorphan  (ROBITUSSIN DM) 100-10 MG/5ML syrup 5 mL  5 mL Oral Q4H PRN Hosie Poisson, MD   5 mL at 11/15/16 0616  . insulin aspart (novoLOG) injection 0-5 Units  0-5 Units Subcutaneous QHS Smith, Rondell A, MD      . insulin aspart (novoLOG) injection 0-9 Units  0-9 Units Subcutaneous TID WC Fuller Plan A, MD   1 Units at 11/18/16 1233  . lidocaine (XYLOCAINE) 1 % (with pres) injection    PRN Monia Sabal, PA-C   10 mL at 11/11/16 1541  . losartan (COZAAR) tablet 50 mg  50 mg Oral Daily Hosie Poisson, MD   50 mg at 11/18/16 1511  . metoprolol succinate (TOPROL-XL) 24 hr tablet 25 mg  25 mg Oral Daily Hosie Poisson, MD   25 mg at 11/18/16 1511  . morphine 2 MG/ML injection 1-2 mg  1-2 mg Intravenous Q4H PRN Hosie Poisson, MD   2 mg at 11/18/16 0227  . nystatin (MYCOSTATIN/NYSTOP) topical powder   Topical TID Norval Morton, MD   1 g at 11/18/16 2121  . ondansetron (ZOFRAN) tablet 4 mg  4 mg Oral Q6H PRN Fuller Plan A, MD       Or  . ondansetron (ZOFRAN) injection 4 mg  4 mg Intravenous Q6H PRN Tamala Julian,  Rondell A, MD   4 mg at 11/17/16 2325  . pantoprazole (PROTONIX) EC tablet 40 mg  40 mg Oral Q0600 Hosie Poisson, MD   40 mg at 11/18/16 0821  . promethazine (PHENERGAN) injection 12.5 mg  12.5 mg Intravenous Q6H PRN Hosie Poisson, MD      . sodium chloride flush (NS) 0.9 % injection 10 mL  10 mL Intracatheter PRN Gorsuch, Ni, MD      . sodium chloride flush (NS) 0.9 % injection 3 mL  3 mL Intravenous PRN Alvy Bimler, Ni, MD      . traMADol (ULTRAM) tablet 50 mg  50 mg Oral Q6H PRN Fuller Plan A, MD   50 mg at 11/16/16 0831     Objective: Vital signs in last 24 hours: Temp:  [97.7 F (36.5 C)-98.5 F (36.9 C)] 98.3 F (36.8 C) (06/30 0612) Pulse Rate:  [64-71] 65 (06/30 0612) Resp:  [15-20] 20 (06/30 0612) BP: (124-140)/(72-90) 124/90 (06/30 0612) SpO2:  [98 %-100 %] 98 % (06/30 0612)  Intake/Output from previous day: 06/29 0701 - 06/30 0700 In: 360 [P.O.:360] Out: 2600  [Urine:2600] Intake/Output this shift: No intake/output data recorded.   Physical Exam  Constitutional:  WD, Obese in NAD A/O x 3.  Musculoskeletal: Normal range of motion. She exhibits edema (bilateral LE right > left. ). She exhibits no tenderness.  Vitals reviewed.   Lab Results:   Recent Labs  11/17/16 0355 11/19/16 0445  WBC 7.3 7.2  HGB 12.2 13.0  HCT 36.0 38.6  PLT 337 312   BMET  Recent Labs  11/17/16 0355 11/19/16 0445  NA 134* 137  K 4.1 4.4  CL 105 107  CO2 21* 22  GLUCOSE 116* 109*  BUN 17 17  CREATININE 0.84 0.75  CALCIUM 8.1* 8.0*   PT/INR No results for input(s): LABPROT, INR in the last 72 hours. ABG No results for input(s): PHART, HCO3 in the last 72 hours.  Invalid input(s): PCO2, PO2  Studies/Results: Dg Abd 1 View  Result Date: 11/18/2016 CLINICAL DATA:  Followup ileus or partial small bowel obstruction. Ovarian cancer. EXAM: ABDOMEN - 1 VIEW COMPARISON:  11/15/2016. FINDINGS: Normal bowel gas pattern.  Unremarkable bones. IMPRESSION: No acute abnormality. Electronically Signed   By: Claudie Revering M.D.   On: 11/18/2016 15:02          LOS: 9 days    Yordy Matton J 11/19/2016 [phone] ID: Annette Farmer, female   DOB: March 19, 1946, 71 y.o.   MRN: [id number]

## 2016-11-19 NOTE — Progress Notes (Signed)
Nursing Note: Pt resting in bed.Day shift nurse shown edema for baseline.wbb

## 2016-11-19 NOTE — Progress Notes (Signed)
Nursing Note: Noticed edema to peri,abd,groin and legs more than nl a shift change.Re-assessed and noticed pronounced edema to those areas and that abd is taunt and pt has visibly much more swelling.A: Paged on-call and orders received to stop IVFs.IVF stopped.Pt resting quietly in bed.No SOB or distress.Pt resting quietly  in bed.wbb

## 2016-11-19 NOTE — Progress Notes (Signed)
BP re check 106/72 HR 95 Results sent to Dr. Bonner Puna per his request.

## 2016-11-19 NOTE — Progress Notes (Signed)
PROGRESS NOTE    Annette Farmer  ZSW:[id number] DOB: 08-19-1945 DOA: 11/10/2016 PCP: Wardell Honour, MD    Brief Narrative: Patient is a 71 year old female who presented to PCPs office 11/05/2016 with right lower extremity edema right hip pain after a fall. Patient noted to have chronic right-sided weakness secondary to a prior stroke. Doppler ultrasounds done concerning for extensive bilateral lower extremity DVT. CT angiogram was done which was negative for PE but CT abdomen did show extensive nodular implants consistent with carcinomatosis along with pelvic mass. Patient also noted to have ascites. On 11/11/2016 patient underwent ultrasound-guided paracentesis with cytology positive for adenocarcinoma. Concern for primary GYN pathology. Patient also noted to have extensive DVT felt likely secondary to untreated cancer. Vascular surgery and IR consulted. General surgery was also consulted due to concerns for small bowel obstruction versus ileus. Patient had a bout of hematuria have a hemoglobin seems stable. Patient seen by GYN oncology and hematology oncology were recommending neoadjuvant chemotherapy as soon as possible versus debulking. Patient was transferred to [facility] for Port-A-Cath placement and to start neoadjuvant chemotherapy as soon as possible. Port placement is delayed, so chemotherapy will be given through peripheral IV 6/28.  Assessment & Plan:   Principal Problem:   Ovarian cancer on right [facility]): Stage 3C Active Problems:   Type 2 diabetes mellitus (HCC)   HYPERTENSION, BENIGN SYSTEMIC   DVT (deep venous thrombosis) (HCC)   AKI (acute kidney injury) ([place])   Pelvic mass   Carcinomatosis ([place])   Metastatic cancer (HCC)   Palliative care encounter   Goals of care, counseling/discussion   Bowel obstruction (HCC)   Severe protein-calorie malnutrition ([place])   Ileus (HCC)   Acute thromboembolism of deep veins of right lower extremity (HCC)   Port-a-cath in  place   DNR (do not resuscitate)  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 Patient underwent paracentesis and cytology consistent with adenocarcinoma likely from probable right ovarian cancer.  - Continue anticoagulation with lovenox per heme/onc recommendations - No surgical debulking planned at this time - Received neoadjuvant chemotherapy 6/28 through peripheral IV. Oncology to schedule outpatient port placement prior to next cycle. Treatment has curative intent. - Palliative care team has met with patient, feels she has low insight into condition. Will follow as outpatient.   Ileus: Felt likely secondary to recently diagnosed stage IIIc ovarian cancer with peritoneal carcinomatosis. Abd XR without obstruction pattern 6/28.  - Observation recommended by general surgery. Improving overall, passing gas still poor po.  - Advance diet as tolerated.  - Oncology feels symptoms will improve with treatment.  - Reporting worsened abd pain 6/30. Suspect reaccumulation of malignant ascites. Low threshold for abx if develops fever, leukocytosis given recent para.   Extensive RLE DVT: U/S 6/21 w/right saphenofemoral jxn, common femoral, profunda, femoral, popliteal, posterior tibial, and peroneal veins. LLE was negative. Likely secondary to stage IIIc untreated right ovarian cancer. Initially it was felt patient may need IVC filter placed due to concerns for hematuria, though hgb stable. Vascular surgery was consulted and it was felt patient did not need a thrombectomy at this time.  - Lovenox and possibly NOAC per hematology/oncology recommendations. - Will [place] LLE given worsening swelling  today. If +new DVT,  may constitute failure of current anticoagulation.   Hematuria: Resolved. Likely related to tumor invasion of bladder.  - Evaluated by Dr. Jeffie Pollock 6/29, recommending no cystoscopy at this time.    Well-controlled diabetes mellitus: Hemoglobin A1c 5.9 on 09/28/2016.  - Monitor closely with decadron given with chemo. At inpatient goal. - Continue SSI  Acute kidney injury: Felt to be secondary to a prerenal azotemia secondary to volume depletion and dehydration. Renal function improved and currently resolved.  GERD: Chronic, worse with carcinomatosis - Continue acid suppression  Essential HTN: Stable.  - Continue Toprol-XL. Blood pressure seems borderline will discontinue Cozaar.  DVT prophylaxis: Lovenox Code Status: Full Family Communication: No family at bedside.  Disposition Plan: Continued management of obstructive GI symptoms. Will need SNF for safe discharge once able to take po.   Consultants:   Palliative care Dr. Rowe Pavy 11/15/2016  Oncology: Dr. Alvy Bimler 11/15/2016  Interventional radiology: Dr. Pascal Lux 11/14/2016  Gen. surgery: Dr. Donne Hazel 11/14/2016   GYN oncology: Dr. Denman George 11/15/2016/Dr. Delsa Sale 11/11/2016  Vascular surgery: Dr. Donzetta Matters 11/12/2016   Urology: Dr. Jeffie Pollock 6/29  Procedures:   Lower extremity Dopplers 11/10/2016  CT angiogram chest 11/10/2016  CT abdomen and pelvis 11/10/2016  Paracentesis for intervention radiology 11/11/2016--per Dr. Geroge Baseman successful ultrasound-guided paracentesis yielding 420 mL of peritoneal fluid.---   chest x-ray 11/10/2016  Abdominal films 11/14/2016, 11/15/2016  Antimicrobials:   None  Subjective: Abdominal pain is worse since last night. No fevers. Has ongoing poor per oral intake due to pain. No vomiting in past 24 hours. Legs also hurt.   Objective: BP 124/90 (BP Location: Right Arm)   Pulse 65   Temp 98.3 F (36.8 C) (Oral)   Resp 20   Ht 5' 1.5" (1.562 m)   Wt 86 kg (189 lb  9.6 oz)   SpO2 98%   BMI 35.24 kg/m   General exam: Calm 71yo F in pain in bed Respiratory system: Nonlabored and clear Cardiovascular system: RRR. No JVD, murmurs.  Gastrointestinal system: Soft, more distended but not tympanitic. Mild tenderness/soreness to palpation. Central nervous system: Alert and oriented. No focal neurological deficits. Extremities: No deformities. Bilateral LE's with 1+ nonpitting edema, tender to palpation. DP pulses 2+, cap refill brisk.  Skin: No rashes, lesions or ulcers Psychiatry: Judgement and insight appear fair. Mood & affect appropriate. Prone to agitation.  CBC:  Recent Labs Lab 11/14/16 0249 11/15/16 0305 11/16/16 0334 11/17/16 0355 11/19/16 0445  WBC 7.5 8.5 7.7 7.3 7.2  HGB 11.7* 12.1 12.8 12.2 13.0  HCT 36.2 37.9 39.5 36.0 38.6  MCV 90.0 90.0 92.5 88.7 87.7  PLT 285 309 266 337 710   Basic Metabolic Panel:  Recent Labs Lab 11/12/16 1423 11/14/16 1244 11/16/16 0950 11/17/16 0355 11/19/16 0445  NA 135 135 134* 134* 137  K 3.9 4.3 4.4 4.1 4.4  CL 106 107 108 105 107  CO2 22 22 19* 21* 22  GLUCOSE 175* 148* 125* 116* 109*  BUN '15 10 13 17 17  ' CREATININE 0.89 0.72 0.81 0.84 0.75  CALCIUM 8.0* 8.0* 8.0* 8.1* 8.0*  MG  --   --  2.0  --   --    GFR: Estimated Creatinine Clearance: 64 mL/min (by C-G formula based on SCr of 0.75 mg/dL). Liver Function Tests:  Recent Labs Lab 11/16/16 0950  AST 24  ALT 13*  ALKPHOS 119  BILITOT 0.4  PROT 4.9*  ALBUMIN 2.0*   No results for input(s): LIPASE, AMYLASE in the last 168 hours. Recent  Results (from the past 240 hour(s))  Culture, body fluid-bottle     Status: None   Collection Time: 11/11/16  3:51 PM  Result Value Ref Range Status   Specimen Description FLUID PERITONEAL  Final   Special Requests BOTTLES DRAWN AEROBIC AND ANAEROBIC 10CC  Final   Culture NO GROWTH 5 DAYS  Final   Report Status 11/16/2016 FINAL  Final  Gram stain     Status: None   Collection Time: 11/11/16   3:51 PM  Result Value Ref Range Status   Specimen Description FLUID PERITONEAL  Final   Special Requests NONE  Final   Gram Stain   Final    MODERATE WBC PRESENT,BOTH PMN AND MONONUCLEAR NO ORGANISMS SEEN    Report Status 11/12/2016 FINAL  Final    Radiology Studies: Dg Abd 1 View  Result Date: 11/18/2016 CLINICAL DATA:  Followup ileus or partial small bowel obstruction. Ovarian cancer. EXAM: ABDOMEN - 1 VIEW COMPARISON:  11/15/2016. FINDINGS: Normal bowel gas pattern.  Unremarkable bones. IMPRESSION: No acute abnormality. Electronically Signed   By: Claudie Revering M.D.   On: 11/18/2016 15:02    Scheduled Meds: . aspirin  81 mg Oral Daily  . enoxaparin (LOVENOX) injection  85 mg Subcutaneous Q12H  . gabapentin  300 mg Oral Daily  . insulin aspart  0-5 Units Subcutaneous QHS  . insulin aspart  0-9 Units Subcutaneous TID WC  . losartan  50 mg Oral Daily  . metoprolol succinate  25 mg Oral Daily  . nystatin   Topical TID  . pantoprazole  40 mg Oral Q0600   Continuous Infusions: . sodium chloride      LOS: 9 days   Time spent: 25 minutes  Vance Gather, MD [facility] Pager [phone]   If 7PM-7AM, please contact night-coverage [email or web address] Password [email or web address] 11/19/2016, 12:25 PM

## 2016-11-19 NOTE — Progress Notes (Signed)
*  Preliminary Results* Left lower extremity venous duplex completed. There is no obvious evidence of acute deep vein thrombosis involving the left lower extremity. There is no evidence of left Baker's cyst.  11/19/2016 2:30 PM  Maudry Mayhew, BS, RVT, RDCS, RDMS

## 2016-11-20 ENCOUNTER — Inpatient Hospital Stay (HOSPITAL_COMMUNITY): Payer: Medicare PPO

## 2016-11-20 LAB — COMPREHENSIVE METABOLIC PANEL
ALT: 28 U/L (ref 14–54)
AST: 54 U/L — AB (ref 15–41)
Albumin: 2.2 g/dL — ABNORMAL LOW (ref 3.5–5.0)
Alkaline Phosphatase: 104 U/L (ref 38–126)
Anion gap: 8 (ref 5–15)
BILIRUBIN TOTAL: 0.7 mg/dL (ref 0.3–1.2)
BUN: 16 mg/dL (ref 6–20)
CHLORIDE: 105 mmol/L (ref 101–111)
CO2: 22 mmol/L (ref 22–32)
CREATININE: 0.81 mg/dL (ref 0.44–1.00)
Calcium: 7.7 mg/dL — ABNORMAL LOW (ref 8.9–10.3)
GFR calc Af Amer: >60 mL/min (ref >60)
GLUCOSE: 161 mg/dL — AB (ref 65–99)
Potassium: 4.7 mmol/L (ref 3.5–5.1)
Sodium: 135 mmol/L (ref 135–145)
Total Protein: 5.1 g/dL — ABNORMAL LOW (ref 6.5–8.1)

## 2016-11-20 LAB — GLUCOSE, CAPILLARY
GLUCOSE-CAPILLARY: 138 mg/dL — AB (ref 65–99)
GLUCOSE-CAPILLARY: 149 mg/dL — AB (ref 65–99)
Glucose-Capillary: 165 mg/dL — ABNORMAL HIGH (ref 65–99)
Glucose-Capillary: 127 mg/dL — ABNORMAL HIGH (ref 65–99)

## 2016-11-20 LAB — CBC
HCT: 37.1 % (ref 36.0–46.0)
Hemoglobin: 12 g/dL (ref 12.0–15.0)
MCH: 28.8 pg (ref 26.0–34.0)
MCHC: 32.3 g/dL (ref 30.0–36.0)
MCV: 89 fL (ref 78.0–100.0)
PLATELETS: 278 10*3/uL (ref 150–400)
RBC: 4.17 MIL/uL (ref 3.87–5.11)
RDW: 14.7 % (ref 11.5–15.5)
WBC: 5.4 10*3/uL (ref 4.0–10.5)

## 2016-11-20 MED ORDER — PHENOL 1.4 % MT LIQD
1.0000 | OROMUCOSAL | Status: DC | PRN
  Filled 2016-11-20: qty 177

## 2016-11-20 MED ORDER — DIATRIZOATE MEGLUMINE & SODIUM 66-10 % PO SOLN
90.0000 mL | Freq: Once | ORAL | Status: AC
  Administered 2016-11-20: 90 mL via NASOGASTRIC
  Filled 2016-11-20: qty 90

## 2016-11-20 NOTE — Progress Notes (Signed)
PROGRESS NOTE    Annette Farmer  ZOX:[id number] DOB: 09/15/1945 DOA: 11/10/2016 PCP: Wardell Honour, MD    Brief Narrative: Patient is a 71 year old female who presented to PCPs office 11/05/2016 with right lower extremity edema right hip pain after a fall. Patient noted to have chronic right-sided weakness secondary to a prior stroke. Doppler ultrasounds done concerning for extensive bilateral lower extremity DVT. CT angiogram was done which was negative for PE but CT abdomen did show extensive nodular implants consistent with carcinomatosis along with pelvic mass. Patient also noted to have ascites. On 11/11/2016 patient underwent ultrasound-guided paracentesis with cytology positive for adenocarcinoma. Concern for primary GYN pathology. Patient also noted to have extensive DVT felt likely secondary to untreated cancer. Vascular surgery and IR consulted. General surgery was also consulted due to concerns for small bowel obstruction versus ileus. Patient had a bout of hematuria have a hemoglobin seems stable. Patient seen by GYN oncology and hematology oncology were recommending neoadjuvant chemotherapy as soon as possible versus debulking. Patient was transferred to [facility] for Port-A-Cath placement and to start neoadjuvant chemotherapy. Port placement was delayed, so chemotherapy was given through peripheral IV 6/28. Ileus has continued. NGT placed 7/1.   Assessment & Plan:   Principal Problem:   Ovarian cancer on right [facility]): Stage 3C Active Problems:   Type 2 diabetes mellitus (HCC)   HYPERTENSION, BENIGN SYSTEMIC   DVT (deep venous thrombosis) (HCC)   AKI (acute kidney injury) ([place])   Pelvic mass   Carcinomatosis ([place])   Metastatic cancer (HCC)   Palliative care encounter   Goals of care, counseling/discussion   Bowel obstruction (HCC)   Severe protein-calorie malnutrition ([place])   Ileus (HCC)   Acute thromboembolism of deep veins of right lower extremity (HCC)  Port-a-cath in place   DNR (do not resuscitate)  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 Patient underwent paracentesis and cytology consistent with adenocarcinoma likely from probable right ovarian cancer.  - Continue anticoagulation with lovenox per heme/onc recommendations - No surgical debulking planned at this time - Received neoadjuvant chemotherapy 6/28 through peripheral IV. Oncology to schedule outpatient port placement prior to next cycle. Treatment has curative intent. - Palliative care team has met with patient, feels she has low insight into condition. Will follow as outpatient.   Carcinomatosis: - Worsened abd pain 6/30. Suspect reaccumulation of malignant ascites. Repeat therapeutic paracentesis.  Ileus: Felt likely secondary to recently diagnosed stage IIIc ovarian cancer with peritoneal carcinomatosis. Abd XR without obstruction pattern 6/28.  - Observation recommended by general surgery. - Still no BM, had large volume emesis of dark gastric secretions today, so will make NPO, place NGT, gastrografin protocol.   Extensive RLE DVT: U/S 6/21 w/right saphenofemoral jxn, common femoral, profunda, femoral, popliteal, posterior tibial, and peroneal veins. LLE was negative. Likely secondary to stage IIIc untreated right ovarian cancer. Initially it was felt patient may need IVC filter placed due to concerns for hematuria, though hgb stable. Vascular surgery was consulted and it was felt patient did not need a thrombectomy at this time.  - Lovenox, consider continuing vs. NOAC per hematology/oncology recommendations going forward. - U/S LLE repeated 6/30 due to swelling,  showed no DVT.   Hematuria: Resolved. Likely related  to tumor invasion of bladder.  - Evaluated by Dr. Jeffie Pollock 6/29, recommending no cystoscopy at this time.    Well-controlled diabetes mellitus: Hemoglobin A1c 5.9 on 09/28/2016.  - Monitor closely with decadron given with chemo. At inpatient goal. - Continue SSI  Acute kidney injury: Felt to be secondary to a prerenal azotemia secondary to volume depletion and dehydration. Renal function improved and currently resolved.  GERD: Chronic, worse with carcinomatosis - Continue acid suppression  Essential HTN: Stable.  - Continue Toprol-XL. Blood pressure seems borderline will discontinue Cozaar.  DVT prophylaxis: Lovenox Code Status: Full Family Communication: No family at bedside.  Disposition Plan: Continued management of obstructive GI symptoms, repeat paracentesis therapeutically. Will need SNF for safe discharge once able to take po.   Consultants:   Palliative care Dr. Rowe Pavy 11/15/2016  Oncology: Dr. Alvy Bimler 11/15/2016  Interventional radiology: Dr. Pascal Lux 11/14/2016  Gen. surgery: Dr. Donne Hazel 11/14/2016   GYN oncology: Dr. Denman George 11/15/2016/Dr. Delsa Sale 11/11/2016  Vascular surgery: Dr. Donzetta Matters 11/12/2016   Urology: Dr. Jeffie Pollock 6/29  Procedures:   Lower extremity Dopplers 11/10/2016  CT angiogram chest 11/10/2016  CT abdomen and pelvis 11/10/2016  Paracentesis for intervention radiology 11/11/2016--per Dr. Geroge Baseman successful ultrasound-guided paracentesis yielding 420 mL of peritoneal fluid.---   chest x-ray 11/10/2016  Abdominal films 11/14/2016, 11/15/2016  Antimicrobials:   None  Subjective: Feels terrible. Tried to eat crackers last night that stayed down but experienced dark liquid emesis with cracker particles at time of interview. States she's passing gas, but last BM was 6/27.   Objective: BP 128/76 (BP Location: Right Arm)   Pulse 89   Temp 98.2 F (36.8 C) (Oral)   Resp 18   Ht 5' 1.5"  (1.562 m)   Wt 86 kg (189 lb 9.6 oz)   SpO2 98%   BMI 35.24 kg/m   General exam: Calm 70yo F Respiratory system: Nonlabored and clear Cardiovascular system: RRR. No JVD, murmurs.  Gastrointestinal system: Soft, more distended but not tympanitic. Mild tenderness/soreness to palpation. Central nervous system: Alert and oriented. No focal neurological deficits. Extremities: No deformities. Bilateral LE's with 1+ nonpitting edema, tender to palpation. DP pulses 2+, cap refill brisk.  Skin: No rashes, lesions or ulcers Psychiatry: Judgement and insight appear fair. Mood & affect appropriate. Prone to agitation.  CBC:  Recent Labs Lab 11/15/16 0305 11/16/16 0334 11/17/16 0355 11/19/16 0445 11/20/16 0426  WBC 8.5 7.7 7.3 7.2 5.4  HGB 12.1 12.8 12.2 13.0 12.0  HCT 37.9 39.5 36.0 38.6 37.1  MCV 90.0 92.5 88.7 87.7 89.0  PLT 309 266 337 312 373   Basic Metabolic Panel:  Recent Labs Lab 11/14/16 1244 11/16/16 0950 11/17/16 0355 11/19/16 0445 11/20/16 0426  NA 135 134* 134* 137 135  K 4.3 4.4 4.1 4.4 4.7  CL 107 108 105 107 105  CO2 22 19* 21* 22 22  GLUCOSE 148* 125* 116* 109* 161*  BUN _0 CREATININE 0.72 0.81 0.84 0.75 0.81  CALCIUM 8.0* 8.0* 8.1* 8.0* 7.7*  MG  --  2.0  --   --   --    GFR: Estimated Creatinine Clearance: 63.2 mL/min (by C-G formula based on SCr of 0.81 mg/dL). Liver Function Tests:  Recent Labs Lab 11/16/16 0950 11/20/16 0426  AST 24 54*  ALT 13* 28  ALKPHOS 119 104  BILITOT 0.4 0.7  PROT 4.9* 5.1*  ALBUMIN 2.0* 2.2*   No results for input(s): LIPASE, AMYLASE in the last 168 hours. Recent  Results (from the past 240 hour(s))  Culture, body fluid-bottle     Status: None   Collection Time: 11/11/16  3:51 PM  Result Value Ref Range Status   Specimen Description FLUID PERITONEAL  Final   Special Requests BOTTLES DRAWN AEROBIC AND ANAEROBIC 10CC  Final   Culture NO GROWTH 5 DAYS  Final   Report Status 11/16/2016 FINAL  Final    Gram stain     Status: None   Collection Time: 11/11/16  3:51 PM  Result Value Ref Range Status   Specimen Description FLUID PERITONEAL  Final   Special Requests NONE  Final   Gram Stain   Final    MODERATE WBC PRESENT,BOTH PMN AND MONONUCLEAR NO ORGANISMS SEEN    Report Status 11/12/2016 FINAL  Final    Radiology Studies: Dg Abd 1 View  Result Date: 11/18/2016 CLINICAL DATA:  Followup ileus or partial small bowel obstruction. Ovarian cancer. EXAM: ABDOMEN - 1 VIEW COMPARISON:  11/15/2016. FINDINGS: Normal bowel gas pattern.  Unremarkable bones. IMPRESSION: No acute abnormality. Electronically Signed   By: Claudie Revering M.D.   On: 11/18/2016 15:02    Scheduled Meds: . aspirin  81 mg Oral Daily  . enoxaparin (LOVENOX) injection  85 mg Subcutaneous Q12H  . gabapentin  300 mg Oral Daily  . insulin aspart  0-5 Units Subcutaneous QHS  . insulin aspart  0-9 Units Subcutaneous TID WC  . metoprolol succinate  25 mg Oral Daily  . nystatin   Topical TID  . pantoprazole  40 mg Oral Q0600   Continuous Infusions: . sodium chloride    . dextrose 5 % and 0.45% NaCl 75 mL/hr at 11/19/16 1728    LOS: 10 days   Time spent: 35 minutes  Vance Gather, MD [facility] Pager [phone]   If 7PM-7AM, please contact night-coverage [email or web address] Password [email or web address] 11/20/2016, 10:19 AM

## 2016-11-20 NOTE — Progress Notes (Signed)
ANTICOAGULATION CONSULT NOTE - Follow Up Consult  Pharmacy Consult for Lovenox Indication: DVT  Patient Measurements: Height: 5' 1.5" (156.2 cm) Weight: 189 lb 9.6 oz (86 kg) IBW/kg (Calculated) : 48.95   Vital Signs: Temp: 98.2 F (36.8 C) (07/01 0430) Temp Source: Oral (07/01 0430) BP: 128/76 (07/01 0430) Pulse Rate: 89 (07/01 0430)  Labs:  Recent Labs  11/17/16 0818 11/19/16 0445 11/20/16 0426  HGB  --  13.0 12.0  HCT  --  38.6 37.1  PLT  --  312 278  HEPARINUNFRC 0.38  --   --   CREATININE  --  0.75 0.81    Estimated Creatinine Clearance: 63.2 mL/min (by C-G formula based on SCr of 0.81 mg/dL).  Assessment: 63 yoF with ovarian cancer, malignant SBO who was initially started on IV heparin for extensive DVTs. Transitioned to Lovenox 6/28 as no longer planning for Port-a-Cath placement for first cycle of chemotherapy. Recent hematuria noted on 6/25 which has resolved and CBC has remained stable. Renal function also stable.  Repeat dopplers 6/30 do not show new clot.   Goal of Therapy:  Heparin level 0.6-1.0 units/ml 4 hours post dose Monitor platelets by anticoagulation protocol: Yes   Plan:   Continue Lovenox 1mg /kg SQ q12h  Follow CBC at least q72h  Monitor renal function and adjust interval as needed  Monitor closely for s/sx bleeding  Follow discharge plans for Lovenox vs DOAC long-term.  Recommend round down to Lovenox 80mg  sq Q12h for outpatient use (single syringe size).    Netta Cedars, PharmD, BCPS Pager: [phone] 11/20/2016 7:53 AM

## 2016-11-20 NOTE — Progress Notes (Signed)
DG ABD portable completed for post 8 hour gastrograph. Paged the physician and he stated to keep the NG tube in place since fluids are being suctioned.

## 2016-11-20 NOTE — Progress Notes (Signed)
Not enough fluid on [place] evaluation to perform paracentesis.  Torri Langston E 1:03 PM 11/20/2016

## 2016-11-20 NOTE — Progress Notes (Signed)
NG tube placed per MD order, patient tolerated procedure well.  Placement verified by xray and tube placed to intermittent suction for a 2 hour period per SBO protocol.

## 2016-11-21 LAB — URINE CULTURE: Culture: >=100000 — AB

## 2016-11-21 LAB — GLUCOSE, CAPILLARY
GLUCOSE-CAPILLARY: 111 mg/dL — AB (ref 65–99)
GLUCOSE-CAPILLARY: 150 mg/dL — AB (ref 65–99)
Glucose-Capillary: 147 mg/dL — ABNORMAL HIGH (ref 65–99)
Glucose-Capillary: 144 mg/dL — ABNORMAL HIGH (ref 65–99)

## 2016-11-21 NOTE — Progress Notes (Signed)
PROGRESS NOTE    Annette Farmer  CBJ:[id number] DOB: 08-12-1945 DOA: 11/10/2016 PCP: Wardell Honour, MD    Brief Narrative: Patient is a 71 year old female who presented to PCPs office 11/05/2016 with right lower extremity edema right hip pain after a fall. Patient noted to have chronic right-sided weakness secondary to a prior stroke. Doppler ultrasounds done concerning for extensive bilateral lower extremity DVT. CT angiogram was done which was negative for PE but CT abdomen did show extensive nodular implants consistent with carcinomatosis along with pelvic mass. Patient also noted to have ascites. On 11/11/2016 patient underwent ultrasound-guided paracentesis with cytology positive for adenocarcinoma. Concern for primary GYN pathology. Patient also noted to have extensive DVT felt likely secondary to untreated cancer. Vascular surgery and IR consulted. General surgery was also consulted due to concerns for small bowel obstruction versus ileus. Patient had a bout of hematuria have a hemoglobin seems stable. Patient seen by GYN oncology and hematology oncology were recommending neoadjuvant chemotherapy as soon as possible versus debulking. Patient was transferred to [facility] for Port-A-Cath placement and to start neoadjuvant chemotherapy. Port placement was delayed, so chemotherapy was given through peripheral IV 6/28. Ileus has continued. NGT placed 7/1 and to be removed 7/2 due to advancement of gastrografin into transverse colon.   Assessment & Plan:   Principal Problem:   Ovarian cancer on right [facility]): Stage 3C Active Problems:   Type 2 diabetes mellitus (HCC)   HYPERTENSION, BENIGN SYSTEMIC   DVT (deep venous thrombosis) (HCC)   AKI (acute kidney injury) ([place])   Pelvic mass   Carcinomatosis ([place])   Metastatic cancer (HCC)   Palliative care encounter   Goals of care, counseling/discussion   Bowel obstruction (HCC)   Severe protein-calorie malnutrition ([place])   Ileus  (HCC)   Acute thromboembolism of deep veins of right lower extremity (HCC)   Port-a-cath in place   DNR (do not resuscitate)  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 Patient underwent paracentesis and cytology consistent with adenocarcinoma likely from probable right ovarian cancer.  - Continue anticoagulation with lovenox per heme/onc recommendations - No surgical debulking planned at this time - Received neoadjuvant chemotherapy 6/28 through peripheral IV. Oncology to schedule outpatient port placement prior to next cycle. Treatment has curative intent. - Palliative care team has met with patient, feels she has low insight into condition. Will follow as outpatient.    Carcinomatosis: - Repeat U/S 7/1 showed no reaccumulation of ascites.   Ileus: Felt likely secondary to recently diagnosed stage IIIc ovarian cancer with peritoneal carcinomatosis. Abd XR without obstruction pattern 6/28.  - Observation recommended by general surgery. - NGT placed 7/1 and gastrografin administer, travelling to transverse colon in 8 hrs, so no obstruction. Will pull NGT and start clear liquids, advancing slowly as tolerated.  - Encourage OOB  Extensive RLE DVT: U/S 6/21 w/right saphenofemoral jxn, common femoral, profunda, femoral, popliteal, posterior tibial, and peroneal veins. LLE was negative. Likely secondary to stage IIIc untreated right ovarian cancer. Initially it was felt patient may need IVC filter placed due to concerns for hematuria, though hgb stable. Vascular surgery was consulted and it was felt patient did not need a thrombectomy at this time.  - Lovenox, consider continuing vs. NOAC  per hematology/oncology recommendations going forward. - U/S LLE repeated 6/30 due to swelling, showed no DVT.   Hematuria: Resolved. Likely related to tumor invasion of bladder.  - Evaluated by Dr. Jeffie Pollock 6/29, recommending no cystoscopy at this time.    Well-controlled diabetes mellitus: Hemoglobin A1c 5.9 on 09/28/2016.  - Monitor closely with decadron given with chemo. At inpatient goal, if remains at inpatient goal, consider [place] AC/HS CBG and coverage and monitoring now that decadron is wearing off.   Acute kidney injury: Felt to be secondary to a prerenal azotemia secondary to volume depletion and dehydration. Renal function improved and currently resolved.  GERD: Chronic, worse with carcinomatosis - Continue acid suppression  Essential HTN: Stable.  - Continue Toprol-XL. Blood pressure seems borderline will discontinue Cozaar.  DVT prophylaxis: Lovenox Code Status: Full Family Communication: No family at bedside.  Disposition Plan: Continued management of obstructive GI symptoms. Will need SNF for safe discharge once able to take po.   Consultants:   Palliative care Dr. Rowe Pavy 11/15/2016  Oncology: Dr. Alvy Bimler 11/15/2016  Interventional radiology: Dr. Pascal Lux 11/14/2016  Gen. surgery: Dr. Donne Hazel 11/14/2016   GYN oncology: Dr. Denman George 11/15/2016/Dr. Delsa Sale 11/11/2016  Vascular surgery: Dr. Donzetta Matters 11/12/2016   Urology: Dr. Jeffie Pollock 6/29  Procedures:   Lower extremity Dopplers 11/10/2016  CT angiogram chest 11/10/2016  CT abdomen and pelvis 11/10/2016  Paracentesis for intervention radiology 11/11/2016--per Dr. Geroge Baseman successful ultrasound-guided paracentesis yielding 420 mL of peritoneal fluid.---   chest x-ray 11/10/2016  Abdominal films 11/14/2016, 11/15/2016  NG tube 7/1 - 7/2; abd XR shows gastrografin in transverse colon 8hrs post insertion.  Antimicrobials:   None  Subjective: Feels abdominal distention improved with NGT, though contrast  made it to the colon, so no obstruction. Desperately wants NGT out and to start clear liquids. +flatus  Objective: BP 114/67 (BP Location: Right Arm)   Pulse 99   Temp 98.5 F (36.9 C) (Oral)   Resp 18   Ht 5' 1.5" (1.562 m)   Wt 86 kg (189 lb 9.6 oz)   SpO2 99%   BMI 35.24 kg/m   General exam: Calm 70yo F HEENT: NGT in left nare not to suction. Respiratory system: Nonlabored and clear Cardiovascular system: RRR. No JVD, murmurs.  Gastrointestinal system: Soft, diffusely distended with minimal tenderness. Central nervous system: Alert and oriented. No focal neurological deficits. Extremities: No deformities. Bilateral LE's with 1+ nonpitting edema, tender to palpation. DP pulses 2+, cap refill brisk.   Skin: No rashes, lesions or ulcers Psychiatry: Judgement and insight appear fair. Mood & affect appropriate. Prone to agitation.  CBC:  Recent Labs Lab 11/15/16 0305 11/16/16 0334 11/17/16 0355 11/19/16 0445 11/20/16 0426  WBC 8.5 7.7 7.3 7.2 5.4  HGB 12.1 12.8 12.2 13.0 12.0  HCT 37.9 39.5 36.0 38.6 37.1  MCV 90.0 92.5 88.7 87.7 89.0  PLT 309 266 337 312 300   Basic Metabolic Panel:  Recent Labs Lab 11/14/16 1244 11/16/16 0950 11/17/16 0355 11/19/16 0445 11/20/16 0426  NA 135 134* 134* 137 135  K 4.3 4.4 4.1 4.4 4.7  CL 107 108 105 107 105  CO2 22 19* 21* 22 22  GLUCOSE 148* 125* 116* 109* 161*  BUN _0 CREATININE 0.72 0.81 0.84 0.75 0.81  CALCIUM 8.0* 8.0* 8.1* 8.0* 7.7*  MG  --  2.0  --   --   --    GFR: Estimated Creatinine Clearance: 63.2 mL/min (by C-G formula based on SCr of 0.81  mg/dL). Liver Function Tests:  Recent Labs Lab 11/16/16 0950 11/20/16 0426  AST 24 54*  ALT 13* 28  ALKPHOS 119 104  BILITOT 0.4 0.7  PROT 4.9* 5.1*  ALBUMIN 2.0* 2.2*   No results for input(s): LIPASE, AMYLASE in the last 168 hours. Recent Results (from the past 240 hour(s))  Culture, body fluid-bottle     Status: None   Collection Time: 11/11/16   3:51 PM  Result Value Ref Range Status   Specimen Description FLUID PERITONEAL  Final   Special Requests BOTTLES DRAWN AEROBIC AND ANAEROBIC 10CC  Final   Culture NO GROWTH 5 DAYS  Final   Report Status 11/16/2016 FINAL  Final  Gram stain     Status: None   Collection Time: 11/11/16  3:51 PM  Result Value Ref Range Status   Specimen Description FLUID PERITONEAL  Final   Special Requests NONE  Final   Gram Stain   Final    MODERATE WBC PRESENT,BOTH PMN AND MONONUCLEAR NO ORGANISMS SEEN    Report Status 11/12/2016 FINAL  Final  Culture, Urine     Status: Abnormal   Collection Time: 11/18/16  1:45 PM  Result Value Ref Range Status   Specimen Description URINE, RANDOM  Final   Special Requests NONE  Final   Culture >=100,000 COLONIES/mL ESCHERICHIA COLI (A)  Final   Report Status 11/21/2016 FINAL  Final   Organism ID, Bacteria ESCHERICHIA COLI (A)  Final      Susceptibility   Escherichia coli - MIC*    AMPICILLIN 8 SENSITIVE Sensitive     CEFAZOLIN <=4 SENSITIVE Sensitive     CEFTRIAXONE <=1 SENSITIVE Sensitive     CIPROFLOXACIN <=0.25 SENSITIVE Sensitive     GENTAMICIN <=1 SENSITIVE Sensitive     IMIPENEM <=0.25 SENSITIVE Sensitive     NITROFURANTOIN 32 SENSITIVE Sensitive     TRIMETH/SULFA <=20 SENSITIVE Sensitive     AMPICILLIN/SULBACTAM 4 SENSITIVE Sensitive     PIP/TAZO <=4 SENSITIVE Sensitive     Extended ESBL NEGATIVE Sensitive     * >=100,000 COLONIES/mL ESCHERICHIA COLI    Radiology Studies: US Abdomen Limited  Result Date: 11/20/2016 CLINICAL DATA:  Gyn malignancy with carcinomatosis. Abdominal distention and malignant small-bowel obstruction. Request is made for evaluations verbal paracentesis. EXAM: LIMITED ABDOMEN ULTRASOUND FOR ASCITES TECHNIQUE: Limited ultrasound survey for ascites was performed in all four abdominal quadrants. COMPARISON:  None. FINDINGS: All 4 quadrants evaluated and minimal ascites is noted. IMPRESSION: Not enough fluid for paracentesis. Read  by: Saverio Danker, PA-C Electronically Signed   By: Markus Daft M.D.   On: 11/20/2016 13:05   Dg Abd Portable 1v-small Bowel Obstruction Protocol-initial, 8 Hr Delay  Result Date: 11/20/2016 CLINICAL DATA:  71 year old female 8 hours status post enteric contrast via NG tube. EXAM: PORTABLE ABDOMEN - 1 VIEW COMPARISON:  1130 hours today, and earlier. FINDINGS: Portable AP supine view at 2123 hours. Stable enteric tube, side hole up the level of the gastric fundus. Oral contrast present throughout the right colon to the mid transverse colon. No dilated small or large bowel loops. Some retained stool in the colon. No acute osseous abnormality identified. IMPRESSION: 1. Enteric contrast has reached the mid transverse colon. No dilated small or large bowel loops. 2. Stable enteric tube. Electronically Signed   By: Genevie Ann M.D.   On: 11/20/2016 21:47   Dg Abd Portable 1v-small Bowel Protocol-position Verification  Result Date: 11/20/2016 CLINICAL DATA:  NG tube placement EXAM: PORTABLE ABDOMEN - 1  VIEW COMPARISON:  11/18/2016 FINDINGS: NG tube is in the stomach. Nonobstructive bowel gas pattern. No free air organomegaly. IMPRESSION: NG tube tip in the stomach. Electronically Signed   By: Rolm Baptise M.D.   On: 11/20/2016 11:47    Scheduled Meds: . aspirin  81 mg Oral Daily  . enoxaparin (LOVENOX) injection  85 mg Subcutaneous Q12H  . gabapentin  300 mg Oral Daily  . insulin aspart  0-5 Units Subcutaneous QHS  . insulin aspart  0-9 Units Subcutaneous TID WC  . metoprolol succinate  25 mg Oral Daily  . nystatin   Topical TID  . pantoprazole  40 mg Oral Q0600   Continuous Infusions: . sodium chloride    . dextrose 5 % and 0.45% NaCl 75 mL/hr at 11/20/16 2224    LOS: 11 days   Time spent: 25 minutes  Vance Gather, MD [facility] Pager [phone]   If 7PM-7AM, please contact night-coverage [email or web address] Password [email or web address] 11/21/2016, 10:19 AM

## 2016-11-21 NOTE — Progress Notes (Signed)
Physical Therapy Treatment Patient Details Name: Annette Farmer MRN: [id number] DOB: [birth year] Today's Date: 11/21/2016    History of Present Illness Pt is a 71 y.o. female with medical history significant of HTN, DM type II, CVA, and depression. She presented with complaints of right leg swelling over the last 1 week.  Ultrasound revealed extensive clot RLE. Abdominal CT revealed right adnexal tumour with carcinomatosis. Plan for aracentesis on 6/29; cytology is still pending; however, she will likely require an omental biopsy for diagnosis. Palliative care mtg 6/27.    PT Comments    The patient requires much encouragement to participate.   Assisted  To Recliner, declined to ambulate . Continue PT while in acute care.   Follow Up Recommendations  SNF;Supervision/Assistance - 24 hour     Equipment Recommendations  None recommended by PT    Recommendations for Other Services       Precautions / Restrictions Precautions Precautions: Fall Precaution Comments: CHEMO    Mobility  Bed Mobility Overal bed mobility: Needs Assistance Bed Mobility: Supine to Sit     Supine to sit: Min assist     General bed mobility comments: HOB elevated and use of rail , assist with trunk to upright  Transfers Overall transfer level: Needs assistance Equipment used: None Transfers: Sit to/from [facility] Sit to Stand: Mod assist Stand pivot transfers: Mod assist          Ambulation/Gait             General Gait Details: pt declined to ambulate   Stairs            Wheelchair Mobility    Modified Rankin (Stroke Patients Only)       Balance                                            Cognition Arousal/Alertness: Awake/alert Behavior During Therapy: WFL for tasks assessed/performed                                   General Comments: requires much encouragement      Exercises      General Comments         Pertinent Vitals/Pain Faces Pain Scale: No hurt    Home Living                      Prior Function            PT Goals (current goals can now be found in the care plan section) Progress towards PT goals: Progressing toward goals    Frequency    Min 3X/week      PT Plan Current plan remains appropriate    Co-evaluation              AM-PAC PT "6 Clicks" Daily Activity  Outcome Measure  Difficulty turning over in bed (including adjusting bedclothes, sheets and blankets)?: Total Difficulty moving from lying on back to sitting on the side of the bed? : Total Difficulty sitting down on and standing up from a chair with arms (e.g., wheelchair, bedside commode, etc,.)?: Total Help needed moving to and from a bed to chair (including a wheelchair)?: A Lot Help needed walking in hospital room?: A Lot Help needed climbing 3-5 steps with a railing? :  Total 6 Click Score: 8    End of Session   Activity Tolerance: Patient limited by fatigue Patient left: in chair;with call bell/phone within reach;with chair alarm set Nurse Communication: Mobility status PT Visit Diagnosis: Unsteadiness on feet (R26.81);History of falling (Z91.81);Muscle weakness (generalized) (M62.81)     Time: 4166-0630 PT Time Calculation (min) (ACUTE ONLY): 24 min  Charges:  $Therapeutic Activity: 23-37 mins                    G CodesTresa Farmer PT [phone]    Annette Farmer 11/21/2016, 1:24 PM

## 2016-11-22 DIAGNOSIS — C801 Malignant (primary) neoplasm, unspecified: Secondary | ICD-10-CM

## 2016-11-22 DIAGNOSIS — C786 Secondary malignant neoplasm of retroperitoneum and peritoneum: Secondary | ICD-10-CM

## 2016-11-22 DIAGNOSIS — K567 Ileus, unspecified: Secondary | ICD-10-CM

## 2016-11-22 LAB — BASIC METABOLIC PANEL
Anion gap: 7 (ref 5–15)
BUN: 10 mg/dL (ref 6–20)
CHLORIDE: 103 mmol/L (ref 101–111)
CO2: 23 mmol/L (ref 22–32)
CREATININE: 0.54 mg/dL (ref 0.44–1.00)
Calcium: 7.6 mg/dL — ABNORMAL LOW (ref 8.9–10.3)
GFR calc Af Amer: >60 mL/min (ref >60)
GFR calc non Af Amer: >60 mL/min (ref >60)
Glucose, Bld: 130 mg/dL — ABNORMAL HIGH (ref 65–99)
POTASSIUM: 4.5 mmol/L (ref 3.5–5.1)
SODIUM: 133 mmol/L — AB (ref 135–145)

## 2016-11-22 LAB — GLUCOSE, CAPILLARY
GLUCOSE-CAPILLARY: 156 mg/dL — AB (ref 65–99)
GLUCOSE-CAPILLARY: 135 mg/dL — AB (ref 65–99)
Glucose-Capillary: 112 mg/dL — ABNORMAL HIGH (ref 65–99)
Glucose-Capillary: 168 mg/dL — ABNORMAL HIGH (ref 65–99)

## 2016-11-22 LAB — CBC
HEMATOCRIT: 34.5 % — AB (ref 36.0–46.0)
Hemoglobin: 11.2 g/dL — ABNORMAL LOW (ref 12.0–15.0)
MCH: 28.6 pg (ref 26.0–34.0)
MCHC: 32.5 g/dL (ref 30.0–36.0)
MCV: 88.2 fL (ref 78.0–100.0)
Platelets: 209 10*3/uL (ref 150–400)
RBC: 3.91 MIL/uL (ref 3.87–5.11)
RDW: 14.4 % (ref 11.5–15.5)
WBC: 3.3 10*3/uL — AB (ref 4.0–10.5)

## 2016-11-22 MED ORDER — BOOST / RESOURCE BREEZE PO LIQD
1.0000 | Freq: Two times a day (BID) | ORAL | Status: DC
  Administered 2016-11-23 – 2016-11-26 (×3): 1 via ORAL

## 2016-11-22 MED ORDER — GI COCKTAIL ~~LOC~~
30.0000 mL | Freq: Three times a day (TID) | ORAL | Status: DC | PRN
  Administered 2016-11-24 – 2016-11-29 (×2): 30 mL via ORAL
  Filled 2016-11-22 (×2): qty 30

## 2016-11-22 MED ORDER — ONDANSETRON HCL 4 MG/2ML IJ SOLN
4.0000 mg | Freq: Three times a day (TID) | INTRAMUSCULAR | Status: DC
  Administered 2016-11-23 – 2016-11-29 (×17): 4 mg via INTRAVENOUS
  Filled 2016-11-22 (×18): qty 2

## 2016-11-22 MED ORDER — SENNA 8.6 MG PO TABS
2.0000 | ORAL_TABLET | Freq: Every day | ORAL | Status: DC
  Administered 2016-11-22: 17.2 mg via ORAL
  Filled 2016-11-22 (×5): qty 2

## 2016-11-22 MED ORDER — ENSURE ENLIVE PO LIQD: 237.0000 mL | ORAL | Status: DC

## 2016-11-22 MED ORDER — BISACODYL 10 MG RE SUPP
10.0000 mg | Freq: Every day | RECTAL | Status: DC
  Filled 2016-11-22 (×4): qty 1

## 2016-11-22 NOTE — Progress Notes (Signed)
Daily Progress Note   Patient Name: Annette Farmer       Date: 11/22/2016 DOB: 10/02/1945  Age: 71 y.o. MRN#: [id number] Attending Physician: Patrecia Pour, MD Primary Care Physician: Wardell Honour, MD Admit Date: 11/10/2016  Reason for Consultation/Follow-up: Non pain symptom management and Psychosocial/spiritual support  Subjective:  "I'm trying not to barf up the small amount of ice cream I ate".  Annette Farmer appears sleepy and swollen all over.  Patient too sleepy to speak much.   Assessment: Admitted with DVT.  Found to have metastatic GYN adenocarcinoma with carcinomatosis.  S/P initial chemo Farmer Thurday.  Nauseated with ileus.  Stomach and extremities appear more swollen when compared with Farmer week.   Port placement currently planned for Friday.     Patient Profile/HPI: 71 y.o. female  with past medical history of CVA, DM, depression and hypertension who was admitted on 11/10/2016 with right lower extremity swelling.  She had been seen outpatient by [facility] who found an extensive DVT on doppler u/s.  They advised her to go to the ER.  Initial work up included a CT abdomen pelvis that showed a right adnexal mass and carcinomatosis.  Paracentesis was performed.  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  Tumor markers revealed a CA-125 of 967.  Her stay has been complicated by a partial SBO that is thought to be caused by tumor.     Length of Stay: 12  Current Medications: Scheduled Meds:  . aspirin  81 mg Oral Daily  . bisacodyl  10 mg Rectal Daily  . enoxaparin (LOVENOX) injection  85 mg Subcutaneous Q12H  . feeding supplement  1 Container Oral BID  . feeding supplement (ENSURE ENLIVE)  237 mL Oral Q24H  . gabapentin  300 mg Oral Daily  . insulin aspart   0-5 Units Subcutaneous QHS  . insulin aspart  0-9 Units Subcutaneous TID WC  . metoprolol succinate  25 mg Oral Daily  . nystatin   Topical TID  . ondansetron (ZOFRAN) IV  4 mg Intravenous TID AC  . pantoprazole  40 mg Oral Q0600  . senna  2 tablet Oral Daily    Continuous Infusions: . sodium chloride    . dextrose 5 % and 0.45% NaCl 75 mL/hr at 11/22/16 1453    PRN Meds:  sodium chloride, acetaminophen **OR** acetaminophen, albuterol, alum & mag hydroxide-simeth, gi cocktail, guaiFENesin-dextromethorphan, lidocaine, morphine injection, ondansetron **OR** ondansetron (ZOFRAN) IV, phenol, promethazine, sodium chloride flush, sodium chloride flush, traMADol  Physical Exam        Sleeping female.  Appears older than stated age.  Poor color Awakes from sleep easily, orientated, coherent.  Cooperative CV irreg rhythm, regular rate Resp no distress Ab:  Obese appears fluid filled Extremities 2+ edema in all 4.  Vital Signs: BP 126/85 (BP Location: Right Arm)   Pulse 96   Temp 97.8 F (36.6 C) (Oral)   Resp 18   Ht 5' 1.5" (1.562 m)   Wt 87.7 kg (193 lb 6.4 oz)   SpO2 96%   BMI 35.95 kg/m  SpO2: SpO2: 96 % O2 Device: O2 Device: Not Delivered O2 Flow Rate:    Intake/output summary:  Intake/Output Summary (Farmer 24 hours) at 11/22/16 1609 Farmer data filed at 11/22/16 1300  Gross per 24 hour  Intake              240 ml  Output              875 ml  Net             -635 ml   LBM: Farmer BM Date: 11/16/16 Baseline Weight: Weight: 73 kg (161 lb) Most recent weight: Weight: 87.7 kg (193 lb 6.4 oz)       Palliative Assessment/Data:    Flowsheet Rows     Most Recent Value  Intake Tab  Referral Department  Hospitalist  Unit at Time of Referral  Oncology Unit  Palliative Care Primary Diagnosis  Cancer  Date Notified  11/14/16  Palliative Care Type  New Palliative care  Reason for referral  Clarify Goals of Care  Date of Admission  11/10/16  Date first seen by Palliative Care   11/15/16  # of days Palliative referral response time  1 Day(s)  # of days IP prior to Palliative referral  4  Clinical Assessment  Palliative Performance Scale Score  50%  Psychosocial & Spiritual Assessment  Palliative Care Outcomes  Patient/Family meeting held?  Yes  Who was at the meeting?  patient, brother in law, brother, and friends  Palliative Care Outcomes  Clarified goals of care, Changed CPR status  Patient/Family wishes: Interventions discontinued/not started   Mechanical Ventilation      Patient Active Problem List   Diagnosis Date Noted  . Ovarian cancer on right [facility]): Stage 3C 11/16/2016  . Ileus ([place]) 11/16/2016  . Acute thromboembolism of deep veins of right lower extremity (HCC)   . Port-a-cath in place   . DNR (do not resuscitate)   . Metastatic cancer ([place])   . Palliative care encounter   . Goals of care, counseling/discussion   . Bowel obstruction ([place])   . Severe protein-calorie malnutrition ([place])   . AKI (acute kidney injury) ([place]) 11/11/2016  . Pelvic mass 11/11/2016  . Carcinomatosis ([place]) 11/11/2016  . DVT (deep venous thrombosis) ([place]) 11/10/2016  . Refusal of statin medication by patient 09/28/2016  . Hemiparesis affecting right side as late effect of cerebrovascular accident ([place]) 04/23/2012  . Type 2 diabetes mellitus ([place]) 07/20/2006  . HYPERCHOLESTEROLEMIA 07/20/2006  . OBESITY, NOS 07/20/2006  . HYPERTENSION, BENIGN SYSTEMIC 07/20/2006  . HEMORRHOIDS, NOS 07/20/2006  . CONSTIPATION 07/20/2006  . INCONTINENCE, STRESS, FEMALE 07/20/2006  . MENOPAUSAL SYNDROME 07/20/2006    Palliative Care Plan    Recommendations/Plan:  Annette Farmer will continue  to follow intermittently in a supportive fashion.  Will schedule zofran prior to meals (4 mg) and continue PRN (4 mg)  Will update HCPOA (Brother in [place], [place])  Goals of Care and Additional Recommendations:  Limitations on Scope of Treatment: Full Scope Treatment  Code Status:   DNR  Prognosis:   Unable to determine   Discharge Planning:  [place] for rehab with Palliative care service follow-up  Called HCPOA to update.  Thank you for allowing the Palliative Medicine Team to assist in the care of this patient.  Total time spent:  35 min.     Greater than 50%  of this time was spent counseling and coordinating care related to the above assessment and plan.  Florentina Jenny, PA-C Palliative Medicine  Please contact Palliative MedicineTeam phone at [phone] for questions and concerns between 7 am - 7 pm.   Please see AMION for individual provider pager numbers.

## 2016-11-22 NOTE — Progress Notes (Signed)
Initial Nutrition Assessment  DOCUMENTATION CODES:   Non-severe (moderate) malnutrition in context of acute illness/injury, Obesity unspecified  INTERVENTION:   Provide trial of nutrition supplements: -Boost Breeze po BID, each supplement provides 250 kcal and 9 grams of protein -Ensure Enlive po BID, each supplement provides 350 kcal and 20 grams of protein  Will assess tolerance and acceptance of supplements  -Encourage PO intake  NUTRITION DIAGNOSIS:   Malnutrition (moderate) related to acute illness as evidenced by energy intake < or equal to 75% for > or equal to 1 month, mild depletion of muscle mass.  GOAL:   Patient will meet greater than or equal to 90% of their needs  MONITOR:   PO intake, Supplement acceptance, Labs, Weight trends, I & O's  REASON FOR ASSESSMENT:   LOS, Low [place]    ASSESSMENT:   71 year old female who presented to PCPs office 11/05/2016 with right lower extremity edema right hip pain after a fall. Patient noted to have chronic right-sided weakness secondary to a prior stroke. Doppler ultrasounds done concerning for extensive bilateral lower extremity DVT. CT angiogram was done which was negative for PE but CT abdomen did show extensive nodular implants consistent with carcinomatosis along with pelvic mass. Patient also noted to have ascites. On 11/11/2016 patient underwent ultrasound-guided paracentesis with cytology positive for adenocarcinoma. Concern for primary GYN pathology. Patient also noted to have extensive DVT felt likely secondary to untreated cancer. Vascular surgery and IR consulted. General surgery was also consulted due to concerns for small bowel obstruction versus ileus. Patient had a bout of hematuria have a hemoglobin seems stable. Patient seen by GYN oncology and hematology oncology were recommending neoadjuvant chemotherapy as soon as possible versus debulking. Patient was transferred to [facility] for Port-A-Cath  placement and to start neoadjuvant chemotherapy. Port placement was delayed, so chemotherapy was given through peripheral IV 6/28. Ileus has continued. NGT placed 7/1 and to be removed 7/2 due to advancement of gastrografin into transverse colon.   Patient is now LOS, day 12. Since admission pt's PO intake has steadily decreased. Pt states that in the ED initially she was eating normally but within 3 days she was not tolerating food anymore d/t N/V. Pt with ileus at this time and just had NGT removed yesterday.  PO intakes have varied, 10-100% over the past 12 days, with many diet changes from NPO to soft diet. Pt does not eat breakfast and has refused to do so here as well.  At this time, pt is on a clear liquid diet and consuming intake cautiously. She had eaten some ice cream this morning prior to RD visit and states that it is staying down so far (she did receive a dose of IV Zofran).   Noted that MD has been discussing TPN with patient and she is not interested at this time. Spoke with patient about this and encouraged her to try supplements to provide necessary nutrients so she can get better. Will monitor and will order supplements for patient to trial. If unable to tolerate any intake, patient will most likely need TPN, as she has not had consistent intake for 12 days now.  Per chart review, pt's weight has increased by 32 lb. Nutrition-Focused physical exam completed. Findings are no fat depletion, mild muscle depletion, and mild edema.   Medications: Dulcolax suppository daily, Protonix tablet daily, Senokot tablet daily, D5 -.45% NaCl infusion at 75 ml/hr-provides 306 kcal, IV Zofran PRN, IV Phenergan PRN Labs reviewed: CBGs: 112-144 Low Na  Diet Order:  Diet clear liquid Room service appropriate? Yes; Fluid consistency: Thin  Skin:  Reviewed, no issues  Last BM:  6/27  Height:   Ht Readings from Last 1 Encounters:  11/17/16 5' 1.5" (1.562 m)    Weight:   Wt Readings from Last  1 Encounters:  11/21/16 193 lb 6.4 oz (87.7 kg)    Ideal Body Weight:  50 kg  BMI:  Body mass index is 35.95 kg/m.  Estimated Nutritional Needs:   Kcal:  1600-1800  Protein:  60-70g  Fluid:  1.8L/day  EDUCATION NEEDS:   Education needs addressed  Clayton Bibles, MS, RD, LDN Pager: [phone] After Hours Pager: [phone]

## 2016-11-22 NOTE — Progress Notes (Signed)
Patient ID: Annette Farmer, female   DOB: 1946-04-10, 71 y.o.   MRN: [id number]    Referring Physician(s): Dr. Vance Gather  Supervising Physician: Markus Daft  Patient Status: [facility] - In-pt  Chief Complaint: Ovarian cancer with carcinomatosis   Subjective: Pt known to IR service.  A new request has been placed for a PAC to be placed as the patient has started chemotherapy and they are asking for this to be placed as the patient will likely be here in the hospital for a while.  The patient is still nauseated and having emesis at times.  Allergies: Penicillins  Medications: Prior to Admission medications   Medication Sig Start Date End Date Taking? Authorizing Provider  aspirin 325 MG tablet Take 325 mg by mouth daily.   Yes [provider]  COLLAGEN PO Take 6 tablets by mouth daily.   Yes [provider]  gabapentin (NEURONTIN) 300 MG capsule Take 1 capsule (300 mg total) by mouth at bedtime. 10/04/16  Yes Wardell Honour, MD  ibuprofen (ADVIL,MOTRIN) 800 MG tablet Take 1 tablet (800 mg total) by mouth every 8 (eight) hours as needed. Patient taking differently: Take 800 mg by mouth every 8 (eight) hours as needed for mild pain.  11/03/16  Yes Stallings, Zoe A, MD  KRILL OIL PO Take 1 tablet by mouth daily.   Yes [provider]  losartan-hydrochlorothiazide (HYZAAR) 50-12.5 MG tablet Take 1 tablet by mouth daily. 09/28/16  Yes Wardell Honour, MD  metFORMIN (GLUCOPHAGE) 500 MG tablet Take 1 tablet (500 mg total) by mouth daily with breakfast. 10/17/16  Yes Wardell Honour, MD  metoprolol succinate (TOPROL-XL) 25 MG 24 hr tablet Take 1 tablet (25 mg total) by mouth daily. 10/17/16  Yes Wardell Honour, MD  nystatin (MYCOSTATIN/NYSTOP) powder Apply topically 3 (three) times daily. TO RASH OF R GROIN 11/05/16   Wendie Agreste, MD    Vital Signs: BP 117/71 (BP Location: Right Arm)   Pulse 96   Temp 98.4 F (36.9 C) (Oral)   Resp 16   Ht 5' 1.5" (1.562 m)   Wt  193 lb 6.4 oz (87.7 kg)   SpO2 96%   BMI 35.95 kg/m   Physical Exam: Gen: ill-appearing white female laying in bed Heart: regular Lungs: CTAB  Imaging: Dg Abd 1 View  Result Date: 11/18/2016 CLINICAL DATA:  Followup ileus or partial small bowel obstruction. Ovarian cancer. EXAM: ABDOMEN - 1 VIEW COMPARISON:  11/15/2016. FINDINGS: Normal bowel gas pattern.  Unremarkable bones. IMPRESSION: No acute abnormality. Electronically Signed   By: Claudie Revering M.D.   On: 11/18/2016 15:02   US Abdomen Limited  Result Date: 11/20/2016 CLINICAL DATA:  Gyn malignancy with carcinomatosis. Abdominal distention and malignant small-bowel obstruction. Request is made for evaluations verbal paracentesis. EXAM: LIMITED ABDOMEN ULTRASOUND FOR ASCITES TECHNIQUE: Limited ultrasound survey for ascites was performed in all four abdominal quadrants. COMPARISON:  None. FINDINGS: All 4 quadrants evaluated and minimal ascites is noted. IMPRESSION: Not enough fluid for paracentesis. Read by: Saverio Danker, PA-C Electronically Signed   By: Markus Daft M.D.   On: 11/20/2016 13:05   Dg Abd Portable 1v-small Bowel Obstruction Protocol-initial, 8 Hr Delay  Result Date: 11/20/2016 CLINICAL DATA:  71 year old female 8 hours status post enteric contrast via NG tube. EXAM: PORTABLE ABDOMEN - 1 VIEW COMPARISON:  1130 hours today, and earlier. FINDINGS: Portable AP supine view at 2123 hours. Stable enteric tube, side hole up the level of the gastric  fundus. Oral contrast present throughout the right colon to the mid transverse colon. No dilated small or large bowel loops. Some retained stool in the colon. No acute osseous abnormality identified. IMPRESSION: 1. Enteric contrast has reached the mid transverse colon. No dilated small or large bowel loops. 2. Stable enteric tube. Electronically Signed   By: Genevie Ann M.D.   On: 11/20/2016 21:47   Dg Abd Portable 1v-small Bowel Protocol-position Verification  Result Date: 11/20/2016 CLINICAL  DATA:  NG tube placement EXAM: PORTABLE ABDOMEN - 1 VIEW COMPARISON:  11/18/2016 FINDINGS: NG tube is in the stomach. Nonobstructive bowel gas pattern. No free air organomegaly. IMPRESSION: NG tube tip in the stomach. Electronically Signed   By: Rolm Baptise M.D.   On: 11/20/2016 11:47    Labs:  CBC:  Recent Labs  11/17/16 0355 11/19/16 0445 11/20/16 0426 11/22/16 0401  WBC 7.3 7.2 5.4 3.3*  HGB 12.2 13.0 12.0 11.2*  HCT 36.0 38.6 37.1 34.5*  PLT 337 312 278 209    COAGS:  Recent Labs  11/11/16 1231  INR 0.96    BMP:  Recent Labs  11/17/16 0355 11/19/16 0445 11/20/16 0426 11/22/16 0401  NA 134* 137 135 133*  K 4.1 4.4 4.7 4.5  CL 105 107 105 103  CO2 21* 22 22 23   GLUCOSE 116* 109* 161* 130*  BUN 17 17 16 10   CALCIUM 8.1* 8.0* 7.7* 7.6*  CREATININE 0.84 0.75 0.81 0.54  GFRNONAA >60 >60 >60 >60  GFRAA >60 >60 >60 >60    LIVER FUNCTION TESTS:  Recent Labs  09/28/16 1715 11/10/16 1656 11/16/16 0950 11/20/16 0426  BILITOT 0.6 0.8 0.4 0.7  AST 19 24 24  54*  ALT 11 12* 13* 28  ALKPHOS 117 123 119 104  PROT 7.2 6.5 4.9* 5.1*  ALBUMIN 4.1 3.0* 2.0* 2.2*    Assessment and Plan: 1. Metastatic ovarian cancer  We will plan to place a PAC on Friday 11-25-16 pending our schedule.  Dr. Anselm Pancoast has reviewed the case and will be here that day. Risks and Benefits discussed with the patient including, but not limited to bleeding, infection, pneumothorax, or fibrin sheath development and need for additional procedures. All of the patient's questions were answered, patient is agreeable to proceed. Consent signed and in chart.   Electronically Signed: Henreitta Cea 11/22/2016, 2:53 PM   I spent a total of 25 Minutes at the the patient's bedside AND on the patient's hospital floor or unit, greater than 50% of which was counseling/coordinating care for ovarian cancer

## 2016-11-22 NOTE — Progress Notes (Addendum)
PROGRESS NOTE    Annette Farmer  CBJ:[id number] DOB: 07/26/1945 DOA: 11/10/2016 PCP: Wardell Honour, MD    Brief Narrative: Patient is a 71 year old female who presented to PCPs office 11/05/2016 with right lower extremity edema right hip pain after a fall. Patient noted to have chronic right-sided weakness secondary to a prior stroke. Doppler ultrasounds done concerning for extensive bilateral lower extremity DVT. CT angiogram was done which was negative for PE but CT abdomen did show extensive nodular implants consistent with carcinomatosis along with pelvic mass. Patient also noted to have ascites. On 11/11/2016 patient underwent ultrasound-guided paracentesis with cytology positive for adenocarcinoma. Concern for primary GYN pathology. Patient also noted to have extensive DVT felt likely secondary to untreated cancer. Vascular surgery and IR consulted. General surgery was also consulted due to concerns for small bowel obstruction versus ileus. Patient had a bout of hematuria have a hemoglobin seems stable. Patient seen by GYN oncology and hematology oncology were recommending neoadjuvant chemotherapy as soon as possible versus debulking. Patient was transferred to [facility] for Port-A-Cath placement and to start neoadjuvant chemotherapy. Port placement was delayed, so chemotherapy was given through peripheral IV 6/28. Ileus has continued. NGT placed 7/1 and to be removed 7/2 due to advancement of gastrografin into transverse colon.   Assessment & Plan:   Principal Problem:   Ovarian cancer on right [facility]): Stage 3C Active Problems:   Type 2 diabetes mellitus (HCC)   HYPERTENSION, BENIGN SYSTEMIC   DVT (deep venous thrombosis) (HCC)   AKI (acute kidney injury) ([place])   Pelvic mass   Carcinomatosis ([place])   Metastatic cancer (HCC)   Palliative care encounter   Goals of care, counseling/discussion   Bowel obstruction (HCC)   Severe protein-calorie malnutrition ([place])   Ileus  (HCC)   Acute thromboembolism of deep veins of right lower extremity (HCC)   Port-a-cath in place   DNR (do not resuscitate)  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 Patient underwent paracentesis and cytology consistent with adenocarcinoma likely from probable right ovarian cancer.  - Continue anticoagulation with lovenox per heme/onc recommendations - No surgical debulking planned at this time - Received neoadjuvant chemotherapy 6/28 through peripheral IV (port placement would have delayed treatment). Treatment has curative intent. - Since pt is likely to remain inpatient, will consult for port placement as inpatient. Checked with pharmacy, this could also allow for TPN if needed. - Palliative care team has met with patient, feels she has low insight into condition. Will follow as outpatient.    Carcinomatosis: - Repeat U/S 7/1 showed no reaccumulation of ascites.   Ileus: Felt likely secondary to recently diagnosed stage IIIc ovarian cancer with peritoneal carcinomatosis. Abd XR without obstruction pattern 6/28, and contrast via NGT 7/1 got to transverse colon, continuing not to have obstructive pattern.  - Observation recommended by general surgery. - NGT pulled 7/2, but poor per oral intake and refractory emesis has continued. - Since no obstruction and stool noted on XR, will give suppository and stimulant laxative. Soaps suds enema prn if unsuccessful. - Discussed impending need for TPN, since no significant po intake >1 week. She is resistant at this time. - Encourage OOB, minimize opioids  Extensive RLE DVT: U/S 6/21 w/right saphenofemoral jxn,  common femoral, profunda,  femoral, popliteal, posterior tibial, and peroneal veins. LLE was negative. Likely secondary to stage IIIc untreated right ovarian cancer. Initially it was felt patient may need IVC filter placed due to concerns for hematuria, though hgb stable. Vascular surgery was consulted and it was felt patient did not need a thrombectomy at this time.  - Lovenox, consider continuing vs. NOAC per hematology/oncology recommendations going forward. - U/S LLE repeated 6/30 due to swelling, showed no DVT.   Hematuria: Resolved. Likely related to tumor invasion of bladder.  - Evaluated by Dr. Jeffie Pollock 6/29, recommending no cystoscopy at this time.    Well-controlled diabetes mellitus: HbA1c 5.9% in May.  - Has been at inpatient goal. Continuing SSI for now with D5-containing fluids (pt's only source of calories). Decadron with chemo didn't seem to exacerbate CBGs much.  Acute kidney injury: Felt to be secondary to a prerenal azotemia secondary to volume depletion and dehydration. Resolved with IVF.  GERD: Chronic, worse with carcinomatosis - Continue acid suppression  Essential HTN: Stable.  - Continue Toprol-XL. Blood pressure seems borderline will discontinue Cozaar.  DVT prophylaxis: Lovenox Code Status: Full Family Communication: No family at bedside.  Disposition Plan: Continued management of obstructive GI symptoms, may require TPN. Will need SNF for safe discharge once able to take po.   Consultants:   Palliative care Dr. Rowe Pavy 11/15/2016  Oncology: Dr. Alvy Bimler 11/15/2016  Interventional radiology: Dr. Pascal Lux 11/14/2016  Gen. surgery: Dr. Donne Hazel 11/14/2016   GYN oncology: Dr. Denman George 11/15/2016/Dr. Delsa Sale 11/11/2016  Vascular surgery: Dr. Donzetta Matters 11/12/2016   Urology: Dr. Jeffie Pollock 6/29  Procedures:   LE doppler 11/10/2016 +RLE widespread DVT; LLE doppler 6/30 no DVT  CTA chest 11/10/2016  CT abd/pelvis 11/10/2016  Paracentesis 11/11/2016 by Dr. Geroge Baseman: 420 mL of peritoneal  fluid.  NG tube 7/1 - 7/2; abd XR shows gastrografin in transverse colon 8hrs post insertion.  Antimicrobials:   None  Subjective: Acutally thinks abdominal distention is improving over past 48 hours. Still only attempting minimal po intake but has had emesis of gastric contents after each attempt. Denies nausea. No BM, +flatus. Does not want TPN.  Objective: BP 117/71 (BP Location: Right Arm)   Pulse 96   Temp 98.4 F (36.9 C) (Oral)   Resp 16   Ht 5' 1.5" (1.562 m)   Wt 87.7 kg (193 lb 6.4 oz)   SpO2 96%   BMI 35.95 kg/m   General exam: Calm, weak-appearing 70yo F HEENT: Normal. Respiratory system: Nonlabored and clear Cardiovascular system: RRR. No JVD, murmurs.  Gastrointestinal system: Soft, diffusely distended with minimal tenderness. 1+ sacral pitting edema.  Central nervous system: Alert and oriented. No focal neurological deficits. Extremities: No deformities. Bilateral LE's with 1+ nonpitting edema, tender to palpation. DP pulses 2+, cap refill brisk. Skin: No rashes, lesions or ulcers Psychiatry: Judgement and insight appear fair. Mood & affect appropriate. Prone to agitation.  CBC:  Recent Labs Lab 11/16/16 0334 11/17/16 0355 11/19/16 0445 11/20/16 0426 11/22/16 0401  WBC 7.7 7.3 7.2 5.4 3.3*  HGB 12.8 12.2 13.0 12.0 11.2*  HCT 39.5 36.0 38.6 37.1 34.5*  MCV 92.5 88.7 87.7 89.0 88.2  PLT 266 337 312 278 407   Basic Metabolic Panel:  Recent Labs Lab 11/16/16 0950 11/17/16 0355 11/19/16 0445 11/20/16 0426 11/22/16 0401  NA 134* 134* 137 135 133*  K 4.4 4.1 4.4 4.7 4.5  CL 108 105 107 105 103  CO2 19* 21* '22 22 23  ' GLUCOSE 125* 116*  109* 161* 130*  BUN '13 17 17 16 10  ' CREATININE 0.81 0.84 0.75 0.81 0.54  CALCIUM 8.0* 8.1* 8.0* 7.7* 7.6*  MG 2.0  --   --   --   --    GFR: Estimated Creatinine Clearance: 64.7 mL/min (by C-G formula based on SCr of 0.54 mg/dL). Liver Function Tests:  Recent Labs Lab 11/16/16 0950 11/20/16 0426  AST 24  54*  ALT 13* 28  ALKPHOS 119 104  BILITOT 0.4 0.7  PROT 4.9* 5.1*  ALBUMIN 2.0* 2.2*   No results for input(s): LIPASE, AMYLASE in the last 168 hours. Recent Results (from the past 240 hour(s))  Culture, Urine     Status: Abnormal   Collection Time: 11/18/16  1:45 PM  Result Value Ref Range Status   Specimen Description URINE, RANDOM  Final   Special Requests NONE  Final   Culture >=100,000 COLONIES/mL ESCHERICHIA COLI (A)  Final   Report Status 11/21/2016 FINAL  Final   Organism ID, Bacteria ESCHERICHIA COLI (A)  Final      Susceptibility   Escherichia coli - MIC*    AMPICILLIN 8 SENSITIVE Sensitive     CEFAZOLIN <=4 SENSITIVE Sensitive     CEFTRIAXONE <=1 SENSITIVE Sensitive     CIPROFLOXACIN <=0.25 SENSITIVE Sensitive     GENTAMICIN <=1 SENSITIVE Sensitive     IMIPENEM <=0.25 SENSITIVE Sensitive     NITROFURANTOIN 32 SENSITIVE Sensitive     TRIMETH/SULFA <=20 SENSITIVE Sensitive     AMPICILLIN/SULBACTAM 4 SENSITIVE Sensitive     PIP/TAZO <=4 SENSITIVE Sensitive     Extended ESBL NEGATIVE Sensitive     * >=100,000 COLONIES/mL ESCHERICHIA COLI    Radiology Studies: US Abdomen Limited  Result Date: 11/20/2016 CLINICAL DATA:  Gyn malignancy with carcinomatosis. Abdominal distention and malignant small-bowel obstruction. Request is made for evaluations verbal paracentesis. EXAM: LIMITED ABDOMEN ULTRASOUND FOR ASCITES TECHNIQUE: Limited ultrasound survey for ascites was performed in all four abdominal quadrants. COMPARISON:  None. FINDINGS: All 4 quadrants evaluated and minimal ascites is noted. IMPRESSION: Not enough fluid for paracentesis. Read by: Saverio Danker, PA-C Electronically Signed   By: Markus Daft M.D.   On: 11/20/2016 13:05   Dg Abd Portable 1v-small Bowel Obstruction Protocol-initial, 8 Hr Delay  Result Date: 11/20/2016 CLINICAL DATA:  71 year old female 8 hours status post enteric contrast via NG tube. EXAM: PORTABLE ABDOMEN - 1 VIEW COMPARISON:  1130 hours today, and  earlier. FINDINGS: Portable AP supine view at 2123 hours. Stable enteric tube, side hole up the level of the gastric fundus. Oral contrast present throughout the right colon to the mid transverse colon. No dilated small or large bowel loops. Some retained stool in the colon. No acute osseous abnormality identified. IMPRESSION: 1. Enteric contrast has reached the mid transverse colon. No dilated small or large bowel loops. 2. Stable enteric tube. Electronically Signed   By: Genevie Ann M.D.   On: 11/20/2016 21:47   Dg Abd Portable 1v-small Bowel Protocol-position Verification  Result Date: 11/20/2016 CLINICAL DATA:  NG tube placement EXAM: PORTABLE ABDOMEN - 1 VIEW COMPARISON:  11/18/2016 FINDINGS: NG tube is in the stomach. Nonobstructive bowel gas pattern. No free air organomegaly. IMPRESSION: NG tube tip in the stomach. Electronically Signed   By: Rolm Baptise M.D.   On: 11/20/2016 11:47    Scheduled Meds: . aspirin  81 mg Oral Daily  . enoxaparin (LOVENOX) injection  85 mg Subcutaneous Q12H  . gabapentin  300 mg Oral Daily  .  insulin aspart  0-5 Units Subcutaneous QHS  . insulin aspart  0-9 Units Subcutaneous TID WC  . metoprolol succinate  25 mg Oral Daily  . nystatin   Topical TID  . pantoprazole  40 mg Oral Q0600   Continuous Infusions: . sodium chloride    . dextrose 5 % and 0.45% NaCl 75 mL/hr at 11/21/16 1419    LOS: 12 days   Time spent: 25 minutes  Vance Gather, MD [facility] Pager [phone]   If 7PM-7AM, please contact night-coverage [email or web address] Password [email or web address] 11/22/2016, 11:31 AM

## 2016-11-22 NOTE — Progress Notes (Signed)
ANTICOAGULATION CONSULT NOTE - Follow Up Consult  Pharmacy Consult for Lovenox Indication: DVT  Patient Measurements: Height: 5' 1.5" (156.2 cm) Weight: 193 lb 6.4 oz (87.7 kg) IBW/kg (Calculated) : 48.95   Vital Signs: Temp: 98.4 F (36.9 C) (07/03 0536) Temp Source: Oral (07/03 0536) BP: 117/71 (07/03 0536) Pulse Rate: 96 (07/03 0536)  Labs:  Recent Labs  11/20/16 0426 11/22/16 0401  HGB 12.0 11.2*  HCT 37.1 34.5*  PLT 278 209  CREATININE 0.81 0.54    Estimated Creatinine Clearance: 64.7 mL/min (by C-G formula based on SCr of 0.54 mg/dL).  Assessment: 88 yoF with ovarian cancer, malignant SBO who was initially started on IV heparin for extensive DVTs. Transitioned to Lovenox 6/28 as no longer planning for Port-a-Cath placement for first cycle of chemotherapy. Recent hematuria noted on 6/25 which has resolved per MD notes. Renal function also stable.  Repeat dopplers 6/30 do not show new clot.   Hgb and platelets trended down slightly today.  No bleeding reported.  Goal of Therapy:  Anti-Xa level 0.6-1 units/ml 4hrs after LMWH dose given Monitor platelets by anticoagulation protocol: Yes   Plan:   Continue Lovenox 1mg /kg SQ q12h  Follow CBC at least q72h. Ordering another CBC for tomorrow morning considering decrease in CBC today.  Monitor renal function and adjust interval as needed  Monitor closely for s/sx bleeding  Hershal Coria, PharmD, BCPS Pager: [phone] 11/22/2016 10:48 AM

## 2016-11-23 LAB — CBC
HCT: 34 % — ABNORMAL LOW (ref 36.0–46.0)
Hemoglobin: 11.3 g/dL — ABNORMAL LOW (ref 12.0–15.0)
MCH: 29.4 pg (ref 26.0–34.0)
MCHC: 33.2 g/dL (ref 30.0–36.0)
MCV: 88.5 fL (ref 78.0–100.0)
PLATELETS: 204 10*3/uL (ref 150–400)
RBC: 3.84 MIL/uL — ABNORMAL LOW (ref 3.87–5.11)
RDW: 14.1 % (ref 11.5–15.5)
WBC: 2.3 10*3/uL — ABNORMAL LOW (ref 4.0–10.5)

## 2016-11-23 LAB — GLUCOSE, CAPILLARY
GLUCOSE-CAPILLARY: 126 mg/dL — AB (ref 65–99)
GLUCOSE-CAPILLARY: 178 mg/dL — AB (ref 65–99)
Glucose-Capillary: 177 mg/dL — ABNORMAL HIGH (ref 65–99)
Glucose-Capillary: 167 mg/dL — ABNORMAL HIGH (ref 65–99)

## 2016-11-23 NOTE — Progress Notes (Signed)
Patient ID: Annette Farmer, female   DOB: 1945/09/30, 71 y.o.   MRN: [id number]  PROGRESS NOTE    Annette Farmer  BTD:[id number] DOB: 1946-04-19 DOA: 11/10/2016  PCP: Wardell Honour, MD   Brief Narrative:  71 year old female with hypertension, chronic right sided weakness from history of CVA. Patient presented to office 11/05/16 with RLE edema and right hip pain after a fall. Pt was subsequently found to have RLE DVT. Pt also noted to have extensive nodular implants consistent with carcinomatosis on CT scan and she also had ascites. Pt underwent paracentesis, cytology consistent with adenocarcinoma. Hospital course was complicated with ileus and gen surgery also consulted. Pt had NG tube from 7/1 - 7/2.  During this hospital stay pt underwent neoadjuvant chemo which was given 6/28 through peripheral line. Plan is for [facility] placement this Friday.    Assessment & Plan:   Principal Problem: Ovarian cancer on right [facility]): Stage 3C - Paracentesis cytology consistent with adenocarcinoma - Received neoadjuvant chemo 6/28 through peripheral IV - Palliative team following - Oncology following   Active Problems: Bowel obstruction ([place]) - Felt to be secondary to ovarian cancer with peritoneal carcinomatosis - Abd xray on 6/28 without obstruction - Observation recommended by gen surgery - NGT pulled out 7/2 - Pt resistant to TPN at this time  Peritoneal carcinomatosis ([place]) - Has had paracentesis   Acute gross hematuria - Likely due to tumor invasion of bladder - GU did not recommend cystoscopy at this time   Acute thromboembolism of deep veins of right lower extremity (HCC) - RLE doppler positive for DVT  - No DVT in LLE - Per vascular surgery, pt did not need thrombectomy - Continue Lovenox  AKI - Thought to be prerenal azotemia due to volume depletion and dehydration - Resolved with IV fluids   Essential hypertension - Continue Toprol   Severe  protein calorie malnutrition - In  the context of chronic illness - Diet as tolerated  DVT prophylaxis: Lovenox subQ Code Status: DNR/DNI  Family Communication: no family at the bedside this am Disposition Plan: plan for [facility] placement on Friday    Consultants:   Palliative care   Oncology, Dr. Alvy Bimler 11/15/2016  IR, Dr. Pascal Lux 11/14/2016  General surgery, Dr. Donne Hazel 11/14/2016  GYN oncology, Dr. Denman George 11/15/2016 and Dr. Delsa Sale 11/11/2016  Vascular surgery, Dr. Donzetta Matters 11/12/2016  Urologym, Dr. Jeffie Pollock 11/18/2016  PT  Nutrition  Procedures:   LE doppler 6/21 - widespread DVT in RLE; LLE doppler 6/30 - no DVT  Paracentesis 6/22 - 420 mg fluid removed   NG tube 7/1 - 7/2   Antimicrobials:   None    Subjective: No overnight events.  Objective: Vitals:   11/22/16 1453 11/22/16 2101 11/23/16 0549 11/23/16 1410  BP: 126/85 106/74 105/87 126/89  Pulse: 96 91 97 91  Resp: 18 16 18 18   Temp: 97.8 F (36.6 C) 98.8 F (37.1 C) 98.4 F (36.9 C) 97.8 F (36.6 C)  TempSrc: Oral Oral Oral Oral  SpO2: 96% 98% 97% 99%  Weight:   90.5 kg (199 lb 9.6 oz)   Height:        Intake/Output Summary (Last 24 hours) at 11/23/16 1746 Last data filed at 11/23/16 1700  Gross per 24 hour  Intake              240 ml  Output              700 ml  Net             -  460 ml   Filed Weights   11/17/16 1212 11/21/16 2125 11/23/16 0549  Weight: 86 kg (189 lb 9.6 oz) 87.7 kg (193 lb 6.4 oz) 90.5 kg (199 lb 9.6 oz)    Examination:  General exam: Appears calm and comfortable  Respiratory system: Clear to auscultation. Respiratory effort normal. Cardiovascular system: S1 & S2 heard, RRR. No JVD, murmurs, rubs, gallops or clicks. No pedal edema. Gastrointestinal system: Abdomen is nondistended, soft and nontender. No organomegaly or masses felt. Normal bowel sounds heard. Central nervous system: Alert and oriented. No focal neurological deficits. Extremities: Symmetric 5 x 5 power. Skin: No rashes, lesions or  ulcers Psychiatry: Judgement and insight appear normal. Mood & affect appropriate.   Data Reviewed: I have personally reviewed following labs and imaging studies  CBC:  Recent Labs Lab 11/17/16 0355 11/19/16 0445 11/20/16 0426 11/22/16 0401 11/23/16 0319  WBC 7.3 7.2 5.4 3.3* 2.3*  HGB 12.2 13.0 12.0 11.2* 11.3*  HCT 36.0 38.6 37.1 34.5* 34.0*  MCV 88.7 87.7 89.0 88.2 88.5  PLT 337 312 278 209 829   Basic Metabolic Panel:  Recent Labs Lab 11/17/16 0355 11/19/16 0445 11/20/16 0426 11/22/16 0401  NA 134* 137 135 133*  K 4.1 4.4 4.7 4.5  CL 105 107 105 103  CO2 21* 22 22 23   GLUCOSE 116* 109* 161* 130*  BUN 17 17 16 10   CREATININE 0.84 0.75 0.81 0.54  CALCIUM 8.1* 8.0* 7.7* 7.6*   GFR: Estimated Creatinine Clearance: 65.8 mL/min (by C-G formula based on SCr of 0.54 mg/dL). Liver Function Tests:  Recent Labs Lab 11/20/16 0426  AST 54*  ALT 28  ALKPHOS 104  BILITOT 0.7  PROT 5.1*  ALBUMIN 2.2*   No results for input(s): LIPASE, AMYLASE in the last 168 hours. No results for input(s): AMMONIA in the last 168 hours. Coagulation Profile: No results for input(s): INR, PROTIME in the last 168 hours. Cardiac Enzymes: No results for input(s): CKTOTAL, CKMB, CKMBINDEX, TROPONINI in the last 168 hours. BNP (last 3 results) No results for input(s): PROBNP in the last 8760 hours. HbA1C: No results for input(s): HGBA1C in the last 72 hours. CBG:  Recent Labs Lab 11/22/16 1702 11/22/16 2151 11/23/16 0752 11/23/16 1146 11/23/16 1713  GLUCAP 156* 135* 126* 177* 167*   Lipid Profile: No results for input(s): CHOL, HDL, LDLCALC, TRIG, CHOLHDL, LDLDIRECT in the last 72 hours. Thyroid Function Tests: No results for input(s): TSH, T4TOTAL, FREET4, T3FREE, THYROIDAB in the last 72 hours. Anemia Panel: No results for input(s): VITAMINB12, FOLATE, FERRITIN, TIBC, IRON, RETICCTPCT in the last 72 hours. Urine analysis:    Component Value Date/Time   COLORURINE YELLOW  11/18/2016 1345   APPEARANCEUR CLEAR 11/18/2016 1345   LABSPEC 1.006 11/18/2016 1345   PHURINE 6.0 11/18/2016 1345   GLUCOSEU NEGATIVE 11/18/2016 1345   HGBUR LARGE (A) 11/18/2016 1345   BILIRUBINUR NEGATIVE 11/18/2016 1345   BILIRUBINUR negative 01/27/2016 1759   BILIRUBINUR neg 04/08/2012 1424   KETONESUR NEGATIVE 11/18/2016 1345   PROTEINUR NEGATIVE 11/18/2016 1345   UROBILINOGEN 0.2 01/27/2016 1759   UROBILINOGEN 1.0 01/06/2011 0008   NITRITE NEGATIVE 11/18/2016 1345   LEUKOCYTESUR MODERATE (A) 11/18/2016 1345   Sepsis Labs: @LABRCNTIP (procalcitonin:4,lacticidven:4)   ) Recent Results (from the past 240 hour(s))  Culture, Urine     Status: Abnormal   Collection Time: 11/18/16  1:45 PM  Result Value Ref Range Status   Specimen Description URINE, RANDOM  Final   Special Requests NONE  Final  Culture >=100,000 COLONIES/mL ESCHERICHIA COLI (A)  Final   Report Status 11/21/2016 FINAL  Final   Organism ID, Bacteria ESCHERICHIA COLI (A)  Final      Susceptibility   Escherichia coli - MIC*    AMPICILLIN 8 SENSITIVE Sensitive     CEFAZOLIN <=4 SENSITIVE Sensitive     CEFTRIAXONE <=1 SENSITIVE Sensitive     CIPROFLOXACIN <=0.25 SENSITIVE Sensitive     GENTAMICIN <=1 SENSITIVE Sensitive     IMIPENEM <=0.25 SENSITIVE Sensitive     NITROFURANTOIN 32 SENSITIVE Sensitive     TRIMETH/SULFA <=20 SENSITIVE Sensitive     AMPICILLIN/SULBACTAM 4 SENSITIVE Sensitive     PIP/TAZO <=4 SENSITIVE Sensitive     Extended ESBL NEGATIVE Sensitive     * >=100,000 COLONIES/mL ESCHERICHIA COLI      Radiology Studies: US Abdomen Limited  Result Date: 11/20/2016 CLINICAL DATA:  Gyn malignancy with carcinomatosis. Abdominal distention and malignant small-bowel obstruction. Request is made for evaluations verbal paracentesis. EXAM: LIMITED ABDOMEN ULTRASOUND FOR ASCITES TECHNIQUE: Limited ultrasound survey for ascites was performed in all four abdominal quadrants. COMPARISON:  None. FINDINGS: All 4  quadrants evaluated and minimal ascites is noted. IMPRESSION: Not enough fluid for paracentesis. Read by: Saverio Danker, PA-C Electronically Signed   By: Markus Daft M.D.   On: 11/20/2016 13:05   Dg Abd Portable 1v-small Bowel Obstruction Protocol-initial, 8 Hr Delay  Result Date: 11/20/2016 CLINICAL DATA:  71 year old female 8 hours status post enteric contrast via NG tube. EXAM: PORTABLE ABDOMEN - 1 VIEW COMPARISON:  1130 hours today, and earlier. FINDINGS: Portable AP supine view at 2123 hours. Stable enteric tube, side hole up the level of the gastric fundus. Oral contrast present throughout the right colon to the mid transverse colon. No dilated small or large bowel loops. Some retained stool in the colon. No acute osseous abnormality identified. IMPRESSION: 1. Enteric contrast has reached the mid transverse colon. No dilated small or large bowel loops. 2. Stable enteric tube. Electronically Signed   By: Genevie Ann M.D.   On: 11/20/2016 21:47   Dg Abd Portable 1v-small Bowel Protocol-position Verification  Result Date: 11/20/2016 CLINICAL DATA:  NG tube placement EXAM: PORTABLE ABDOMEN - 1 VIEW COMPARISON:  11/18/2016 FINDINGS: NG tube is in the stomach. Nonobstructive bowel gas pattern. No free air organomegaly. IMPRESSION: NG tube tip in the stomach. Electronically Signed   By: Rolm Baptise M.D.   On: 11/20/2016 11:47        Scheduled Meds: . aspirin  81 mg Oral Daily  . bisacodyl  10 mg Rectal Daily  . enoxaparin (LOVENOX) injection  85 mg Subcutaneous Q12H  . feeding supplement  1 Container Oral BID  . feeding supplement (ENSURE ENLIVE)  237 mL Oral Q24H  . gabapentin  300 mg Oral Daily  . insulin aspart  0-5 Units Subcutaneous QHS  . insulin aspart  0-9 Units Subcutaneous TID WC  . metoprolol succinate  25 mg Oral Daily  . nystatin   Topical TID  . ondansetron (ZOFRAN) IV  4 mg Intravenous TID AC  . pantoprazole  40 mg Oral Q0600  . senna  2 tablet Oral Daily   Continuous  Infusions: . sodium chloride    . dextrose 5 % and 0.45% NaCl 75 mL/hr at 11/22/16 1453     LOS: 13 days    Time spent: 25 minutes  Greater than 50% of the time spent on counseling and coordinating the care.   Leisa Lenz, MD [facility]  Pager [phone]  If 7PM-7AM, please contact night-coverage [email or web address] Password [email or web address] 11/23/2016, 5:46 PM

## 2016-11-23 NOTE — Progress Notes (Signed)
PT Cancellation Note  Patient Details Name: Annette Farmer MRN: [id number] DOB: August 07, 1945   Cancelled Treatment:     PT tx attempted this am.  Pt adamantly refusing stating she was tired, she was in pain, she hated the recliner and she was incontinent of bowel (but dont tell the nurses because she did not want to move to get cleaned up).  Will follow.   Arneda Sappington 11/23/2016, 11:48 AM

## 2016-11-24 LAB — CBC
HCT: 34.9 % — ABNORMAL LOW (ref 36.0–46.0)
Hemoglobin: 12.1 g/dL (ref 12.0–15.0)
MCH: 30.2 pg (ref 26.0–34.0)
MCHC: 34.7 g/dL (ref 30.0–36.0)
MCV: 87 fL (ref 78.0–100.0)
PLATELETS: 158 10*3/uL (ref 150–400)
RBC: 4.01 MIL/uL (ref 3.87–5.11)
RDW: 13.7 % (ref 11.5–15.5)
WBC: 2.2 10*3/uL — AB (ref 4.0–10.5)

## 2016-11-24 LAB — GLUCOSE, CAPILLARY
GLUCOSE-CAPILLARY: 179 mg/dL — AB (ref 65–99)
GLUCOSE-CAPILLARY: 163 mg/dL — AB (ref 65–99)
GLUCOSE-CAPILLARY: 148 mg/dL — AB (ref 65–99)
GLUCOSE-CAPILLARY: 140 mg/dL — AB (ref 65–99)

## 2016-11-24 LAB — BASIC METABOLIC PANEL
ANION GAP: 10 (ref 5–15)
Anion gap: 3 — ABNORMAL LOW (ref 5–15)
BUN: 11 mg/dL (ref 6–20)
BUN: 11 mg/dL (ref 6–20)
CALCIUM: 8.2 mg/dL — AB (ref 8.9–10.3)
CHLORIDE: 100 mmol/L — AB (ref 101–111)
CO2: 22 mmol/L (ref 22–32)
CO2: 27 mmol/L (ref 22–32)
CREATININE: 0.79 mg/dL (ref 0.44–1.00)
Calcium: 7.4 mg/dL — ABNORMAL LOW (ref 8.9–10.3)
Chloride: 99 mmol/L — ABNORMAL LOW (ref 101–111)
Creatinine, Ser: 0.69 mg/dL (ref 0.44–1.00)
GFR calc Af Amer: >60 mL/min (ref >60)
GFR calc Af Amer: >60 mL/min (ref >60)
GFR calc non Af Amer: >60 mL/min (ref >60)
GLUCOSE: 166 mg/dL — AB (ref 65–99)
GLUCOSE: 184 mg/dL — AB (ref 65–99)
POTASSIUM: 4.4 mmol/L (ref 3.5–5.1)
POTASSIUM: 4.3 mmol/L (ref 3.5–5.1)
SODIUM: 131 mmol/L — AB (ref 135–145)
SODIUM: 130 mmol/L — AB (ref 135–145)

## 2016-11-24 MED ORDER — METOCLOPRAMIDE HCL 5 MG/ML IJ SOLN
5.0000 mg | Freq: Three times a day (TID) | INTRAMUSCULAR | Status: DC
  Administered 2016-11-24 – 2016-11-29 (×13): 5 mg via INTRAVENOUS
  Filled 2016-11-24 (×14): qty 2

## 2016-11-24 MED ORDER — PROMETHAZINE HCL 25 MG/ML IJ SOLN
25.0000 mg | Freq: Four times a day (QID) | INTRAMUSCULAR | Status: DC | PRN
  Administered 2016-11-26: 25 mg via INTRAVENOUS
  Filled 2016-11-24 (×2): qty 1

## 2016-11-24 NOTE — Progress Notes (Signed)
Nutrition Follow-up  DOCUMENTATION CODES:   Non-severe (moderate) malnutrition in context of acute illness/injury, Obesity unspecified  INTERVENTION:   Continue Boost Breeze po TID, each supplement provides 250 kcal and 9 grams of protein Will leave Ensure order for when diet is advanced.  Encourage PO intake RD will continue to monitor for plan  NUTRITION DIAGNOSIS:   Malnutrition(moderate) related to acute illness as evidenced by energy intake < or equal to 75% for > or equal to 1 month, mild depletion of muscle mass.  Ongoing.  GOAL:   Patient will meet greater than or equal to 90% of their needs  Not meeting.  MONITOR:   PO intake, Supplement acceptance, Labs, Weight trends, I & O's   ASSESSMENT:   71 year old female who presented to PCPs office 11/05/2016 with right lower extremity edema right hip pain after a fall. Patient noted to have chronic right-sided weakness secondary to a prior stroke. Doppler ultrasounds done concerning for extensive bilateral lower extremity DVT. CT angiogram was done which was negative for PE but CT abdomen did show extensive nodular implants consistent with carcinomatosis along with pelvic mass. Patient also noted to have ascites. On 11/11/2016 patient underwent ultrasound-guided paracentesis with cytology positive for adenocarcinoma. Concern for primary GYN pathology. Patient also noted to have extensive DVT felt likely secondary to untreated cancer. Vascular surgery and IR consulted. General surgery was also consulted due to concerns for small bowel obstruction versus ileus. Patient had a bout of hematuria have a hemoglobin seems stable. Patient seen by GYN oncology and hematology oncology were recommending neoadjuvant chemotherapy as soon as possible versus debulking. Patient was transferred to [facility] for Port-A-Cath placement and to start neoadjuvant chemotherapy. Port placement was delayed, so chemotherapy was given through  peripheral IV 6/28. Ileus has continued. NGT placed 7/1 and to be removed 7/2 due to advancement of gastrografin into transverse colon.   Patient's PO intake has remained poor. Pt did drink 2 Boost Breeze supplements yesterday but today has developed N/V. Pt would benefit from TPN but is refusing. Will monitor for plan.  Weight is +43 lb since admission 6/22.  Medications: IV Reglan every 8 hours,  IV Zofran TID & PRN, Protonix tablet daily, Senokot tablet daily, D5 -.45% NaCl infusion at 75 ml/hr- provides 306 kcal Labs reviewed: CBGs: 178-179 Low Na  Diet Order:  Diet clear liquid Room service appropriate? Yes; Fluid consistency: Thin Diet NPO time specified Except for: Sips with Meds  Skin:  Reviewed, no issues  Last BM:  7/4  Height:   Ht Readings from Last 1 Encounters:  11/17/16 5' 1.5" (1.562 m)    Weight:   Wt Readings from Last 1 Encounters:  11/24/16 204 lb 9.4 oz (92.8 kg)    Ideal Body Weight:  50 kg  BMI:  Body mass index is 38.03 kg/m.  Estimated Nutritional Needs:   Kcal:  1600-1800  Protein:  60-70g  Fluid:  1.8L/day  EDUCATION NEEDS:   Education needs addressed  Clayton Bibles, MS, RD, LDN Pager: [phone] After Hours Pager: [phone]

## 2016-11-24 NOTE — Progress Notes (Signed)
Patient ID: Annette Farmer, female   DOB: [birth year], 71 y.o.   MRN: [id number]  PROGRESS NOTE    Annette Farmer  UQJ:[id number] DOB: [birth year] DOA: 11/10/2016  PCP: Wardell Honour, MD   Brief Narrative:  71 year old female with hypertension, chronic right sided weakness from history of CVA. Patient presented to office 11/05/16 with RLE edema and right hip pain after a fall. Pt was subsequently found to have RLE DVT. Pt also noted to have extensive nodular implants consistent with carcinomatosis on CT scan and she also had ascites. Pt underwent paracentesis, cytology consistent with adenocarcinoma. Hospital course was complicated with ileus and gen surgery also consulted. Pt had NG tube from 7/1 - 7/2.  During this hospital stay pt underwent neoadjuvant chemo which was given 6/28 through peripheral line. Plan is for [facility] placement this Friday.    Assessment & Plan:   Principal Problem: Ovarian cancer on right [facility]): Stage 3C - Cytology consistent with adenocarcinoma - Neoadjuvant chemo started 6/28 - Palliative care team following - Oncology following    Active Problems: Bowel obstruction ([place]) - In the setting of peritoneal carcinomatosis - No obstruction on abd x ray 6/28 - Gen surgery signed off - NGT pulled out 7/2 - Declined TPN  Peritoneal carcinomatosis ([place]) - Manage per oncology   Acute gross hematuria - Due to tumor invasion into bladder - No recommendations for cystoscopy per GU  Acute thromboembolism of deep veins of right lower extremity ([facility]) - RLE doppler positive for DVT  - No DVT in LLE - No need for thrombectomy per vascula - Continue Lovenox   AKI - Thought to be prerenal azotemia due to volume depletion and dehydration - Improved with IV fluids   Essential hypertension - Continue Toprol   Severe  protein calorie malnutrition - In the setting of ovarian ca - On liquids but not tolerating much - Declined TPN  DVT prophylaxis: Lovenox subQ Code  Status: DNR/DNI  Family Communication: no family at bedside Disposition Plan: PAC placement on Friday    Consultants:   Palliative care   Oncology, Dr. Alvy Bimler 11/15/2016  IR, Dr. Pascal Lux 11/14/2016  General surgery, Dr. Donne Hazel 11/14/2016  GYN oncology, Dr. Denman George 11/15/2016 and Dr. Delsa Sale 11/11/2016  Vascular surgery, Dr. Donzetta Matters 11/12/2016  Urologym, Dr. Jeffie Pollock 11/18/2016  PT  Nutrition  Procedures:   LE doppler 6/21 - widespread DVT in RLE; LLE doppler 6/30 - no DVT  Paracentesis 6/22 - 420 mg fluid removed   NG tube 7/1 - 7/2   Antimicrobials:   None   Subjective: Feels more nauseous this am.  Objective: Vitals:   11/23/16 0549 11/23/16 1410 11/23/16 2004 11/24/16 0550  BP: 105/87 126/89 111/71 114/90  Pulse: 97 91 84 (!) 106  Resp: 18 18 18 18   Temp: 98.4 F (36.9 C) 97.8 F (36.6 C) 98.7 F (37.1 C) 98 F (36.7 C)  TempSrc: Oral Oral Oral Oral  SpO2: 97% 99% 100% 99%  Weight: 90.5 kg (199 lb 9.6 oz)   92.8 kg (204 lb 9.4 oz)  Height:        Intake/Output Summary (Last 24 hours) at 11/24/16 1050 Last data filed at 11/23/16 1700  Gross per 24 hour  Intake              240 ml  Output                0 ml  Net  240 ml   Filed Weights   11/21/16 2125 11/23/16 0549 11/24/16 0550  Weight: 87.7 kg (193 lb 6.4 oz) 90.5 kg (199 lb 9.6 oz) 92.8 kg (204 lb 9.4 oz)    Examination:  Physical Exam  Constitutional: Appears well-developed and well-nourished. No distress.  CVS: RRR, S1/S2 + Pulmonary: Effort and breath sounds normal, no stridor, rhonchi, wheezes, rales.  Abdominal: Soft. BS +,  no distension, tenderness, rebound or guarding.  Musculoskeletal: Normal range of motion. No edema and no tenderness.  Lymphadenopathy: No lymphadenopathy noted, cervical, inguinal. Neuro: Alert. Normal reflexes, muscle tone coordination. No cranial nerve deficit. Skin: Skin is warm and dry.  Psychiatric: Normal mood and affect. Behavior, judgment,  thought content normal.     Data Reviewed: I have personally reviewed following labs and imaging studies  CBC:  Recent Labs Lab 11/19/16 0445 11/20/16 0426 11/22/16 0401 11/23/16 0319 11/24/16 0328  WBC 7.2 5.4 3.3* 2.3* 2.2*  HGB 13.0 12.0 11.2* 11.3* 12.1  HCT 38.6 37.1 34.5* 34.0* 34.9*  MCV 87.7 89.0 88.2 88.5 87.0  PLT 312 278 209 204 951   Basic Metabolic Panel:  Recent Labs Lab 11/19/16 0445 11/20/16 0426 11/22/16 0401 11/24/16 0328  NA 137 135 133* 131*  K 4.4 4.7 4.5 4.4  CL 107 105 103 99*  CO2 22 22 23 22   GLUCOSE 109* 161* 130* 166*  BUN 17 16 10 11   CREATININE 0.75 0.81 0.54 0.69  CALCIUM 8.0* 7.7* 7.6* 8.2*   GFR: Estimated Creatinine Clearance: 66.7 mL/min (by C-G formula based on SCr of 0.69 mg/dL). Liver Function Tests:  Recent Labs Lab 11/20/16 0426  AST 54*  ALT 28  ALKPHOS 104  BILITOT 0.7  PROT 5.1*  ALBUMIN 2.2*   No results for input(s): LIPASE, AMYLASE in the last 168 hours. No results for input(s): AMMONIA in the last 168 hours. Coagulation Profile: No results for input(s): INR, PROTIME in the last 168 hours. Cardiac Enzymes: No results for input(s): CKTOTAL, CKMB, CKMBINDEX, TROPONINI in the last 168 hours. BNP (last 3 results) No results for input(s): PROBNP in the last 8760 hours. HbA1C: No results for input(s): HGBA1C in the last 72 hours. CBG:  Recent Labs Lab 11/23/16 0752 11/23/16 1146 11/23/16 1713 11/23/16 2140 11/24/16 0748  GLUCAP 126* 177* 167* 178* 179*   Lipid Profile: No results for input(s): CHOL, HDL, LDLCALC, TRIG, CHOLHDL, LDLDIRECT in the last 72 hours. Thyroid Function Tests: No results for input(s): TSH, T4TOTAL, FREET4, T3FREE, THYROIDAB in the last 72 hours. Anemia Panel: No results for input(s): VITAMINB12, FOLATE, FERRITIN, TIBC, IRON, RETICCTPCT in the last 72 hours. Urine analysis:    Component Value Date/Time   COLORURINE YELLOW 11/18/2016 1345   APPEARANCEUR CLEAR 11/18/2016 1345     LABSPEC 1.006 11/18/2016 1345   PHURINE 6.0 11/18/2016 1345   GLUCOSEU NEGATIVE 11/18/2016 1345   HGBUR LARGE (A) 11/18/2016 1345   BILIRUBINUR NEGATIVE 11/18/2016 1345   BILIRUBINUR negative 01/27/2016 1759   BILIRUBINUR neg 04/08/2012 1424   KETONESUR NEGATIVE 11/18/2016 1345   PROTEINUR NEGATIVE 11/18/2016 1345   UROBILINOGEN 0.2 01/27/2016 1759   UROBILINOGEN 1.0 01/06/2011 0008   NITRITE NEGATIVE 11/18/2016 1345   LEUKOCYTESUR MODERATE (A) 11/18/2016 1345   Sepsis Labs: @LABRCNTIP (procalcitonin:4,lacticidven:4)   ) Recent Results (from the past 240 hour(s))  Culture, Urine     Status: Abnormal   Collection Time: 11/18/16  1:45 PM  Result Value Ref Range Status   Specimen Description URINE, RANDOM  Final  Special Requests NONE  Final   Culture >=100,000 COLONIES/mL ESCHERICHIA COLI (A)  Final   Report Status 11/21/2016 FINAL  Final   Organism ID, Bacteria ESCHERICHIA COLI (A)  Final      Susceptibility   Escherichia coli - MIC*    AMPICILLIN 8 SENSITIVE Sensitive     CEFAZOLIN <=4 SENSITIVE Sensitive     CEFTRIAXONE <=1 SENSITIVE Sensitive     CIPROFLOXACIN <=0.25 SENSITIVE Sensitive     GENTAMICIN <=1 SENSITIVE Sensitive     IMIPENEM <=0.25 SENSITIVE Sensitive     NITROFURANTOIN 32 SENSITIVE Sensitive     TRIMETH/SULFA <=20 SENSITIVE Sensitive     AMPICILLIN/SULBACTAM 4 SENSITIVE Sensitive     PIP/TAZO <=4 SENSITIVE Sensitive     Extended ESBL NEGATIVE Sensitive     * >=100,000 COLONIES/mL ESCHERICHIA COLI      Radiology Studies: US Abdomen Limited  Result Date: 11/20/2016 CLINICAL DATA:  Gyn malignancy with carcinomatosis. Abdominal distention and malignant small-bowel obstruction. Request is made for evaluations verbal paracentesis. EXAM: LIMITED ABDOMEN ULTRASOUND FOR ASCITES TECHNIQUE: Limited ultrasound survey for ascites was performed in all four abdominal quadrants. COMPARISON:  None. FINDINGS: All 4 quadrants evaluated and minimal ascites is noted.  IMPRESSION: Not enough fluid for paracentesis. Read by: Saverio Danker, PA-C Electronically Signed   By: Markus Daft M.D.   On: 11/20/2016 13:05   Dg Abd Portable 1v-small Bowel Obstruction Protocol-initial, 8 Hr Delay  Result Date: 11/20/2016 CLINICAL DATA:  71 year old female 8 hours status post enteric contrast via NG tube. EXAM: PORTABLE ABDOMEN - 1 VIEW COMPARISON:  1130 hours today, and earlier. FINDINGS: Portable AP supine view at 2123 hours. Stable enteric tube, side hole up the level of the gastric fundus. Oral contrast present throughout the right colon to the mid transverse colon. No dilated small or large bowel loops. Some retained stool in the colon. No acute osseous abnormality identified. IMPRESSION: 1. Enteric contrast has reached the mid transverse colon. No dilated small or large bowel loops. 2. Stable enteric tube. Electronically Signed   By: Genevie Ann M.D.   On: 11/20/2016 21:47   Dg Abd Portable 1v-small Bowel Protocol-position Verification  Result Date: 11/20/2016 CLINICAL DATA:  NG tube placement EXAM: PORTABLE ABDOMEN - 1 VIEW COMPARISON:  11/18/2016 FINDINGS: NG tube is in the stomach. Nonobstructive bowel gas pattern. No free air organomegaly. IMPRESSION: NG tube tip in the stomach. Electronically Signed   By: Rolm Baptise M.D.   On: 11/20/2016 11:47        Scheduled Meds: . aspirin  81 mg Oral Daily  . bisacodyl  10 mg Rectal Daily  . enoxaparin (LOVENOX) injection  85 mg Subcutaneous Q12H  . feeding supplement  1 Container Oral BID  . feeding supplement (ENSURE ENLIVE)  237 mL Oral Q24H  . gabapentin  300 mg Oral Daily  . insulin aspart  0-5 Units Subcutaneous QHS  . insulin aspart  0-9 Units Subcutaneous TID WC  . metoprolol succinate  25 mg Oral Daily  . nystatin   Topical TID  . ondansetron (ZOFRAN) IV  4 mg Intravenous TID AC  . pantoprazole  40 mg Oral Q0600  . senna  2 tablet Oral Daily   Continuous Infusions: . sodium chloride    . dextrose 5 % and 0.45%  NaCl 75 mL/hr at 11/23/16 1813     LOS: 14 days    Time spent: 25 minutes  Greater than 50% of the time spent on counseling and coordinating the care.  Leisa Lenz, MD [facility] Pager [phone]  If 7PM-7AM, please contact night-coverage [email or web address] Password [email or web address] 11/24/2016, 10:50 AM

## 2016-11-24 NOTE — Progress Notes (Addendum)
Refused am meds; states nausea better but takes pills in the pm.

## 2016-11-24 NOTE — Progress Notes (Addendum)
Chaplain responding to spiritual care consult initiated by palliative care.   Introduced spiritual care service and provided emotional and spiritual support at bedside.  Annette Farmer expressed feeling stuck and uncertain about next steps in her care here.   She spoke with chaplain about resources for support in her life, naming her church ([employer] [facility]) and several friends whom she has known since college.   In the past, she has gone out with these friends once a week.    She has a snarky humor and revels in being a bit counter-cultural.  Describes attempting to hold on to this, as "it is the only thing that is keeping me going."  She used to enjoy supernatural films and shows with literary themes (Such as Buffy), but said a pastor spoke to her once about watching these and she has since not watched them.   A former horseback rider, she also used to engage in serious crafting and has accumulated many craft supplies she used to use in her work.  She has been grieving shifts in her health that keep her from being able to enjoy elements of her life.    She was formerly at [employer] and stated she was unhappy there.  Described "the people there want you to get up at 7:15 and exercise and eat healthy" and jokingly described it as "bright, happy people."  I get the sense that she has never put much value in external validation or "keeping up appearances" and has a snarky distaste of others who do.     States she is hopeful to discharge to [facility], but is uncertain of her course.    Chaplain will follow up for continued pastoral presence and emotional support as she is admitted.     [facility] / [place], MDiv

## 2016-11-24 NOTE — Progress Notes (Signed)
PT Cancellation Note  Patient Details Name: Annette Farmer MRN: [id number] DOB: February 15, 1946   Cancelled Treatment:    Reason Eval/Treat Not Completed: Medical issues which prohibited therapy (patient reports has had emesis. check back another time.)   Marcelino Freestone PT [phone]  11/24/2016, 2:06 PM

## 2016-11-25 ENCOUNTER — Inpatient Hospital Stay: Payer: Medicare Other | Admitting: Hematology and Oncology

## 2016-11-25 ENCOUNTER — Encounter (HOSPITAL_COMMUNITY): Payer: Self-pay | Admitting: Diagnostic Radiology

## 2016-11-25 ENCOUNTER — Inpatient Hospital Stay (HOSPITAL_COMMUNITY): Payer: Medicare PPO

## 2016-11-25 ENCOUNTER — Ambulatory Visit: Payer: Medicare Other

## 2016-11-25 DIAGNOSIS — D701 Agranulocytosis secondary to cancer chemotherapy: Secondary | ICD-10-CM

## 2016-11-25 HISTORY — PX: IR US GUIDE VASC ACCESS LEFT: IMG2389

## 2016-11-25 HISTORY — PX: IR IMAGING GUIDED PORT INSERTION: IMG5740

## 2016-11-25 LAB — DIFFERENTIAL
BASOS ABS: 0 10*3/uL (ref 0.0–0.1)
Basophils Relative: 0 %
Eosinophils Absolute: 0.1 10*3/uL (ref 0.0–0.7)
Eosinophils Relative: 7 %
LYMPHS ABS: 0.3 10*3/uL — AB (ref 0.7–4.0)
LYMPHS PCT: 37 %
Monocytes Absolute: 0.1 10*3/uL (ref 0.1–1.0)
Monocytes Relative: 16 %
NEUTROS ABS: 0.3 10*3/uL — AB (ref 1.7–7.7)
NEUTROS PCT: 40 %

## 2016-11-25 LAB — CBC
HCT: 29.2 % — ABNORMAL LOW (ref 36.0–46.0)
HEMOGLOBIN: 9.9 g/dL — AB (ref 12.0–15.0)
MCH: 29.7 pg (ref 26.0–34.0)
MCHC: 33.9 g/dL (ref 30.0–36.0)
MCV: 87.7 fL (ref 78.0–100.0)
Platelets: 145 10*3/uL — ABNORMAL LOW (ref 150–400)
RBC: 3.33 MIL/uL — AB (ref 3.87–5.11)
RDW: 14 % (ref 11.5–15.5)
WBC: 0.8 10*3/uL — CL (ref 4.0–10.5)

## 2016-11-25 LAB — GLUCOSE, CAPILLARY
GLUCOSE-CAPILLARY: 146 mg/dL — AB (ref 65–99)
GLUCOSE-CAPILLARY: 135 mg/dL — AB (ref 65–99)
GLUCOSE-CAPILLARY: 122 mg/dL — AB (ref 65–99)
Glucose-Capillary: 148 mg/dL — ABNORMAL HIGH (ref 65–99)

## 2016-11-25 LAB — PROTIME-INR
INR: 1.06
Prothrombin Time: 13.8 seconds (ref 11.4–15.2)

## 2016-11-25 LAB — ABO/RH: ABO/RH(D): A NEG

## 2016-11-25 LAB — TYPE AND SCREEN
ABO/RH(D): A NEG
Antibody Screen: NEGATIVE

## 2016-11-25 MED ORDER — CLINDAMYCIN PHOSPHATE 600 MG/50ML IV SOLN
600.0000 mg | Freq: Once | INTRAVENOUS | Status: AC
  Administered 2016-11-25: 600 mg via INTRAVENOUS
  Filled 2016-11-25: qty 50

## 2016-11-25 MED ORDER — LIDOCAINE-EPINEPHRINE (PF) 2 %-1:200000 IJ SOLN
INTRAMUSCULAR | Status: AC
  Filled 2016-11-25: qty 20

## 2016-11-25 MED ORDER — TBO-FILGRASTIM 300 MCG/0.5ML ~~LOC~~ SOSY
300.0000 ug | PREFILLED_SYRINGE | Freq: Every day | SUBCUTANEOUS | Status: DC
  Administered 2016-11-25 – 2016-11-28 (×4): 300 ug via SUBCUTANEOUS
  Filled 2016-11-25 (×4): qty 0.5

## 2016-11-25 MED ORDER — MIDAZOLAM HCL 2 MG/2ML IJ SOLN
INTRAMUSCULAR | Status: AC
  Filled 2016-11-25: qty 6

## 2016-11-25 MED ORDER — DEXTROSE 5 % IV SOLN
1.0000 g | INTRAVENOUS | Status: DC
  Administered 2016-11-25: 1 g via INTRAVENOUS
  Filled 2016-11-25 (×2): qty 10

## 2016-11-25 MED ORDER — LIDOCAINE-EPINEPHRINE (PF) 2 %-1:200000 IJ SOLN
INTRAMUSCULAR | Status: AC | PRN
  Administered 2016-11-25: 10 mL

## 2016-11-25 MED ORDER — MIDAZOLAM HCL 2 MG/2ML IJ SOLN
INTRAMUSCULAR | Status: AC | PRN
  Administered 2016-11-25 (×2): 0.5 mg via INTRAVENOUS

## 2016-11-25 MED ORDER — HEPARIN SOD (PORK) LOCK FLUSH 100 UNIT/ML IV SOLN
INTRAVENOUS | Status: AC
Start: 2016-11-25 — End: 2016-11-25
  Administered 2016-11-25: 500 [IU]
  Filled 2016-11-25: qty 5

## 2016-11-25 MED ORDER — FENTANYL CITRATE (PF) 100 MCG/2ML IJ SOLN
INTRAMUSCULAR | Status: AC
  Filled 2016-11-25: qty 6

## 2016-11-25 MED ORDER — FENTANYL CITRATE (PF) 100 MCG/2ML IJ SOLN
INTRAMUSCULAR | Status: AC | PRN
  Administered 2016-11-25: 25 ug via INTRAVENOUS

## 2016-11-25 MED ORDER — LIDOCAINE HCL 1 % IJ SOLN
INTRAMUSCULAR | Status: AC
  Filled 2016-11-25: qty 20

## 2016-11-25 MED ORDER — LIDOCAINE HCL 1 % IJ SOLN
INTRAMUSCULAR | Status: AC | PRN
  Administered 2016-11-25: 10 mL via INTRADERMAL

## 2016-11-25 NOTE — Sedation Documentation (Signed)
Pt getting some nacl at 553ml an hour to help raise BP.

## 2016-11-25 NOTE — Progress Notes (Signed)
DC plan continues to be to SNF.  Pt adamant she will only go to [facility]. TPN to start today and [facility] does not accept pt's with TPN. This could be barrier to discharge planning. CM will continue to follow.  Marney Doctor RN,BSN,NCM [phone]

## 2016-11-25 NOTE — Progress Notes (Signed)
Annette Farmer   DOB:[birth year]   LE#:[id number]    Subjective: The patient is noted to have a bucket in front of her.  She denies significant nausea or vomiting. She is wondering when she can be discharged She denies constipation or diarrhea She is cranky because she was placed n.p.o. for possible port placement  Objective:  Vitals:   11/24/16 2134 11/25/16 0504  BP: 116/74 111/74  Pulse: 79 84  Resp: 16 16  Temp: 99.6 F (37.6 C) 99.1 F (37.3 C)     Intake/Output Summary (Last 24 hours) at 11/25/16 1305 Last data filed at 11/25/16 0827  Gross per 24 hour  Intake          9333.75 ml  Output                0 ml  Net          9333.75 ml    GENERAL:alert, no distress and comfortable SKIN: Noted significant skin bruising. EYES: normal, Conjunctiva are pink and non-injected, sclera clear OROPHARYNX:no exudate, no erythema and lips, buccal mucosa, and tongue normal  NECK: supple, thyroid normal size, non-tender, without nodularity LYMPH:  no palpable lymphadenopathy in the cervical, axillary or inguinal LUNGS: clear to auscultation and percussion with normal breathing effort HEART: regular rate & rhythm and no murmurs and no lower extremity edema ABDOMEN:abdomen soft, non-tender and normal bowel sounds Musculoskeletal:no cyanosis of digits and no clubbing.  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 I agree with neoadjuvant chemotherapy approach She appears very frail and is unlikely able to  withstand primary surgical resection especially with recent diagnosis of extensive DVT, making primary debulking surgery somewhat risky  She received cycle 1 of treatment on 11/17/2016  Acquired pancytopenia Due to hospitalization, I am not able to prescribe Neulasta support Due to pancytopenia, I will initiate daily Granix until Bruce is greater than 1.5 She does not need blood transfusion unless hemoglobin is less than 8  Mild GI obstructive symptoms/ileus This is likely due to carcinomatosis Continue conservative approach Continue to advance her diet as tolerated  Extensive DVT Due to untreated cancer I do not feel it is necessary for IVC filter placement She is on Lovenox.  I recommend we continue the same  Strong family history of cancer I would refer her to see genetic counselor after discharge  Diabetes and history of stroke The patient would be at high risk of severe hyperglycemia after treatment I recommend first dose of chemo to be given in the inpatient setting with close monitoring of her blood sugar afterwards I will reduce her baseline aspirin therapy from 325 mg daily to 81 mg daily  Poor social circumstances  The patient is very adamant she would not go to skilled nursing facility She claims to have close friends at church who would be willing to provide transportation back and forth to the cancer center for treatment Please see  documentation by palliative care team dated 11/16/2016. Church friends have significant concerns about safety being at home We will continue to work with palliative care team and case management for discharge planning With significant pancytopenia, I felt it is prudent to keep her in the hospital until [facility] is greater than 1.5  Moderate to severe protein calorie malnutrition Due to poor oral intake and malignancy The patient has declined TPN  Goals of care Her disease is highly treatable, potentially curable with aggressive  management The patient has living will at home [place] is changed to DNR per discussion with palliative care service  Discharge planning She is not safe to be discharged until [facility] is greater than 1.5 I will return to check on her next week  Heath Lark, MD 11/25/2016  1:05 PM

## 2016-11-25 NOTE — Progress Notes (Signed)
CSW assisting with d/c planning. Pt is aware [facility] ( [facility] is unable to manage TPN at facility. CSW offered to review SNF's that do manage TPN but pt declined.   Werner Lean LCSW [phone]

## 2016-11-25 NOTE — Progress Notes (Signed)
CSW assisting with SNF placement. Pt remains unstable for d/c. Pt is hoping to have rehab at [facility] ( [facility] ) once stable for dc. CSW will assist with [facility] re authorization closer to time of dc.  Werner Lean LCSW [phone]

## 2016-11-25 NOTE — Progress Notes (Addendum)
CRITICAL VALUE ALERT  Critical Value:  WBC 0.8  Date & Time Notied:  11/25/16 0429  Provider Notified: Opyd MD 0430  Orders Received/Actions taken: protective precautions, add differential to lab work, type and screen

## 2016-11-25 NOTE — Progress Notes (Signed)
PT Cancellation Note  Patient Details Name: Annette Farmer MRN: [id number] DOB: 06-22-1945   Cancelled Treatment:    Reason Eval/Treat Not Completed: Patient at procedure or test/unavailable   Edoardo Laforte,KATHrine E 11/25/2016, 2:14 PM Carmelia Bake, PT, DPT 11/25/2016 Pager: [phone]

## 2016-11-25 NOTE — Progress Notes (Addendum)
Patient ID: Annette Farmer, female   DOB: June 02, 1945, 71 y.o.   MRN: [id number]  PROGRESS NOTE    Annette Farmer  GDJ:[id number] DOB: 1945/08/21 DOA: 11/10/2016  PCP: Wardell Honour, MD   Brief Narrative:  71 year old female with hypertension, chronic right sided weakness from history of CVA. Patient presented to office 11/05/16 with RLE edema and right hip pain after a fall. Pt was subsequently found to have RLE DVT. Pt also noted to have extensive nodular implants consistent with carcinomatosis on CT scan and she also had ascites. Pt underwent paracentesis, cytology consistent with adenocarcinoma. Hospital course was complicated with ileus and gen surgery also consulted. Pt had NG tube from 7/1 - 7/2.  During this hospital stay pt underwent neoadjuvant chemo which was given 6/28 through peripheral line.  Plan for [facility] placement today.   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) - In the setting of peritoneal carcinomatosis - X ray 6/28 - no obstruction  - NG tube pulled 7/2 - Possible TPN after PAC placed   E.Coli UTI - Continue Rocephin   Acute gross hematuria - Due to tumor invasion into bladder - No intervention per GU  Acute thromboembolism of deep veins of right lower extremity (HCC) - RLE doppler positive for DVT  - No DVT in LLE - No thrombectomy required per vascular surgery  - Continue Lovenox   AKI - Thought to be prerenal azotemia due to volume depletion and dehydration - Cr improved with IV fluids   Essential hypertension - Continue Toprol   Severe  protein calorie malnutrition - Due to ovarian ca - Possible TPN for nutritional support   DVT prophylaxis: Lovenox subQ Code Status:  DNR/DNI Family Communication: no family at bedside Disposition Plan: PAC placement today    Consultants:   Palliative care   Oncology, Dr. Alvy Bimler 11/15/2016  IR, Dr. Pascal Lux 11/14/2016  General surgery, Dr. Donne Hazel 11/14/2016  GYN oncology, Dr. Denman George 11/15/2016 and Dr. Delsa Sale 11/11/2016  Vascular surgery, Dr. Donzetta Matters 11/12/2016  Urologym, Dr. Jeffie Pollock 11/18/2016  PT  Nutrition  Procedures:   LE doppler 6/21 - widespread DVT in RLE; LLE doppler 6/30 - no DVT  Paracentesis 6/22 - 420 mg fluid removed   NG tube 7/1 - 7/2   Antimicrobials:   None    Subjective: Less nausea this am.  Objective: Vitals:   11/24/16 0550 11/24/16 1341 11/24/16 2134 11/25/16 0504  BP: 114/90 114/73 116/74 111/74  Pulse: (!) 106 92 79 84  Resp: 18 16 16 16   Temp: 98 F (36.7 C) 97.9 F (36.6 C) 99.6 F (37.6 C) 99.1 F (37.3 C)  TempSrc: Oral Oral Oral Oral  SpO2: 99% 98% 98% 99%  Weight: 92.8 kg (204 lb 9.4 oz)     Height:        Intake/Output Summary (Last 24 hours) at 11/25/16 1108 Last data filed at 11/25/16 0827  Gross per 24 hour  Intake          9333.75 ml  Output                0 ml  Net          9333.75 ml   Filed Weights   11/21/16 2125  11/23/16 0549 11/24/16 0550  Weight: 87.7 kg (193 lb 6.4 oz) 90.5 kg (199 lb 9.6 oz) 92.8 kg (204 lb 9.4 oz)    Examination:  Physical Exam  Constitutional: Appears well-developed and well-nourished. No distress.  CVS: RRR, S1/S2 + Pulmonary: Effort and breath sounds normal, no wheezing Musculoskeletal: Normal range of motion.  Lymphadenopathy: No lymphadenopathy noted, cervical, inguinal. Neuro: Alert. Normal reflexes, muscle tone coordination. No cranial nerve deficit. Skin: Skin is warm and dry. UE b/u are swollen  Psychiatric: Normal mood and affect.     Data Reviewed: I have personally reviewed following labs and imaging studies  CBC:  Recent Labs Lab 11/20/16 0426 11/22/16 0401 11/23/16 0319 11/24/16 0328  11/25/16 0349  WBC 5.4 3.3* 2.3* 2.2* 0.8*  NEUTROABS  --   --   --   --  0.3*  HGB 12.0 11.2* 11.3* 12.1 9.9*  HCT 37.1 34.5* 34.0* 34.9* 29.2*  MCV 89.0 88.2 88.5 87.0 87.7  PLT 278 209 204 158 024*   Basic Metabolic Panel:  Recent Labs Lab 11/19/16 0445 11/20/16 0426 11/22/16 0401 11/24/16 0328 11/24/16 1204  NA 137 135 133* 131* 130*  K 4.4 4.7 4.5 4.4 4.3  CL 107 105 103 99* 100*  CO2 22 22 23 22 27   GLUCOSE 109* 161* 130* 166* 184*  BUN 17 16 10 11 11   CREATININE 0.75 0.81 0.54 0.69 0.79  CALCIUM 8.0* 7.7* 7.6* 8.2* 7.4*   GFR: Estimated Creatinine Clearance: 66.7 mL/min (by C-G formula based on SCr of 0.79 mg/dL). Liver Function Tests:  Recent Labs Lab 11/20/16 0426  AST 54*  ALT 28  ALKPHOS 104  BILITOT 0.7  PROT 5.1*  ALBUMIN 2.2*   No results for input(s): LIPASE, AMYLASE in the last 168 hours. No results for input(s): AMMONIA in the last 168 hours. Coagulation Profile:  Recent Labs Lab 11/25/16 0349  INR 1.06   Cardiac Enzymes: No results for input(s): CKTOTAL, CKMB, CKMBINDEX, TROPONINI in the last 168 hours. BNP (last 3 results) No results for input(s): PROBNP in the last 8760 hours. HbA1C: No results for input(s): HGBA1C in the last 72 hours. CBG:  Recent Labs Lab 11/24/16 0748 11/24/16 1210 11/24/16 1747 11/24/16 2129 11/25/16 0744  GLUCAP 179* 163* 148* 140* 146*   Lipid Profile: No results for input(s): CHOL, HDL, LDLCALC, TRIG, CHOLHDL, LDLDIRECT in the last 72 hours. Thyroid Function Tests: No results for input(s): TSH, T4TOTAL, FREET4, T3FREE, THYROIDAB in the last 72 hours. Anemia Panel: No results for input(s): VITAMINB12, FOLATE, FERRITIN, TIBC, IRON, RETICCTPCT in the last 72 hours. Urine analysis:    Component Value Date/Time   COLORURINE YELLOW 11/18/2016 1345   APPEARANCEUR CLEAR 11/18/2016 1345   LABSPEC 1.006 11/18/2016 1345   PHURINE 6.0 11/18/2016 1345   GLUCOSEU NEGATIVE 11/18/2016 1345   HGBUR LARGE (A)  11/18/2016 1345   BILIRUBINUR NEGATIVE 11/18/2016 1345   BILIRUBINUR negative 01/27/2016 1759   BILIRUBINUR neg 04/08/2012 1424   KETONESUR NEGATIVE 11/18/2016 1345   PROTEINUR NEGATIVE 11/18/2016 1345   UROBILINOGEN 0.2 01/27/2016 1759   UROBILINOGEN 1.0 01/06/2011 0008   NITRITE NEGATIVE 11/18/2016 1345   LEUKOCYTESUR MODERATE (A) 11/18/2016 1345   Sepsis Labs: @LABRCNTIP (procalcitonin:4,lacticidven:4)   Recent Results (from the past 240 hour(s))  Culture, Urine     Status: Abnormal   Collection Time: 11/18/16  1:45 PM  Result Value Ref Range Status   Specimen Description URINE, RANDOM  Final   Special Requests NONE  Final   Culture >=100,000  COLONIES/mL ESCHERICHIA COLI (A)  Final   Report Status 11/21/2016 FINAL  Final   Organism ID, Bacteria ESCHERICHIA COLI (A)  Final      Susceptibility   Escherichia coli - MIC*    AMPICILLIN 8 SENSITIVE Sensitive     CEFAZOLIN <=4 SENSITIVE Sensitive     CEFTRIAXONE <=1 SENSITIVE Sensitive     CIPROFLOXACIN <=0.25 SENSITIVE Sensitive     GENTAMICIN <=1 SENSITIVE Sensitive     IMIPENEM <=0.25 SENSITIVE Sensitive     NITROFURANTOIN 32 SENSITIVE Sensitive     TRIMETH/SULFA <=20 SENSITIVE Sensitive     AMPICILLIN/SULBACTAM 4 SENSITIVE Sensitive     PIP/TAZO <=4 SENSITIVE Sensitive     Extended ESBL NEGATIVE Sensitive     * >=100,000 COLONIES/mL ESCHERICHIA COLI      Radiology Studies: No results found.      Scheduled Meds: . aspirin  81 mg Oral Daily  . bisacodyl  10 mg Rectal Daily  . feeding supplement  1 Container Oral BID  . feeding supplement (ENSURE ENLIVE)  237 mL Oral Q24H  . gabapentin  300 mg Oral Daily  . insulin aspart  0-5 Units Subcutaneous QHS  . insulin aspart  0-9 Units Subcutaneous TID WC  . metoCLOPramide (REGLAN) injection  5 mg Intravenous Q8H  . metoprolol succinate  25 mg Oral Daily  . nystatin   Topical TID  . ondansetron (ZOFRAN) IV  4 mg Intravenous TID AC  . pantoprazole  40 mg Oral Q0600  .  senna  2 tablet Oral Daily  . Tbo-filgastrim (GRANIX) SQ  300 mcg Subcutaneous q1800   Continuous Infusions: . dextrose 5 % and 0.45% NaCl 75 mL/hr at 11/25/16 0827     LOS: 15 days    Time spent: 25 minutes  Greater than 50% of the time spent on counseling and coordinating the care.   Leisa Lenz, MD [facility] Pager [phone]  If 7PM-7AM, please contact night-coverage [email or web address] Password [email or web address] 11/25/2016, 11:08 AM

## 2016-11-25 NOTE — Procedures (Signed)
Placement of left jugular portacath.  Tip at superior cavoatrial junction.  Minimal blood loss and no immediate complication.  See full report in Imaging.

## 2016-11-26 LAB — CBC WITH DIFFERENTIAL/PLATELET
BASOS ABS: 0 10*3/uL (ref 0.0–0.1)
BASOS PCT: 0 %
Band Neutrophils: 0 %
Blasts: 0 %
EOS PCT: 2 %
Eosinophils Absolute: 0 10*3/uL (ref 0.0–0.7)
HCT: 25.7 % — ABNORMAL LOW (ref 36.0–46.0)
Hemoglobin: 8.9 g/dL — ABNORMAL LOW (ref 12.0–15.0)
LYMPHS ABS: 0.3 10*3/uL — AB (ref 0.7–4.0)
LYMPHS PCT: 41 %
MCH: 30.3 pg (ref 26.0–34.0)
MCHC: 34.6 g/dL (ref 30.0–36.0)
MCV: 87.4 fL (ref 78.0–100.0)
MONOS PCT: 35 %
Metamyelocytes Relative: 0 %
Monocytes Absolute: 0.2 10*3/uL (ref 0.1–1.0)
Myelocytes: 0 %
NEUTROS ABS: 0.2 10*3/uL — AB (ref 1.7–7.7)
NEUTROS PCT: 22 %
NRBC: 0 /100{WBCs}
Other: 0 %
PLATELETS: 157 10*3/uL (ref 150–400)
Promyelocytes Absolute: 0 %
RBC: 2.94 MIL/uL — AB (ref 3.87–5.11)
RDW: 14 % (ref 11.5–15.5)
WBC: 0.7 10*3/uL — AB (ref 4.0–10.5)

## 2016-11-26 LAB — GLUCOSE, CAPILLARY
GLUCOSE-CAPILLARY: 143 mg/dL — AB (ref 65–99)
GLUCOSE-CAPILLARY: 159 mg/dL — AB (ref 65–99)
GLUCOSE-CAPILLARY: 127 mg/dL — AB (ref 65–99)
Glucose-Capillary: 151 mg/dL — ABNORMAL HIGH (ref 65–99)

## 2016-11-26 LAB — BASIC METABOLIC PANEL
Anion gap: 4 — ABNORMAL LOW (ref 5–15)
BUN: 13 mg/dL (ref 6–20)
CHLORIDE: 98 mmol/L — AB (ref 101–111)
CO2: 26 mmol/L (ref 22–32)
CREATININE: 0.83 mg/dL (ref 0.44–1.00)
Calcium: 7.2 mg/dL — ABNORMAL LOW (ref 8.9–10.3)
GFR calc Af Amer: >60 mL/min (ref >60)
GFR calc non Af Amer: >60 mL/min (ref >60)
GLUCOSE: 141 mg/dL — AB (ref 65–99)
POTASSIUM: 3.9 mmol/L (ref 3.5–5.1)
SODIUM: 128 mmol/L — AB (ref 135–145)

## 2016-11-26 MED ORDER — DEXTROSE-NACL 5-0.9 % IV SOLN
INTRAVENOUS | Status: DC
  Administered 2016-11-26 – 2016-11-27 (×3): via INTRAVENOUS
  Administered 2016-11-28: 1000 mL via INTRAVENOUS

## 2016-11-26 MED ORDER — ENOXAPARIN SODIUM 100 MG/ML ~~LOC~~ SOLN
85.0000 mg | Freq: Two times a day (BID) | SUBCUTANEOUS | Status: DC
  Administered 2016-11-26 – 2016-11-29 (×7): 85 mg via SUBCUTANEOUS
  Filled 2016-11-26 (×7): qty 1

## 2016-11-26 MED ORDER — CEFAZOLIN SODIUM-DEXTROSE 1-4 GM/50ML-% IV SOLN
1.0000 g | Freq: Three times a day (TID) | INTRAVENOUS | Status: DC
  Administered 2016-11-26 – 2016-11-29 (×9): 1 g via INTRAVENOUS
  Filled 2016-11-26 (×11): qty 50

## 2016-11-26 NOTE — Progress Notes (Addendum)
Patient ID: Annette Farmer, female   DOB: 12-06-1945, 71 y.o.   MRN: [id number]  PROGRESS NOTE    TRINITA DEVLIN  ZYS:[id number] DOB: 20-Jul-1945 DOA: 11/10/2016  PCP: Wardell Honour, MD   Brief Narrative:  71 year old female with hypertension, chronic right sided weakness from history of CVA. Patient presented to office 11/05/16 with RLE edema and right hip pain after a fall. Pt was subsequently found to have RLE DVT. Pt also noted to have extensive nodular implants consistent with carcinomatosis on CT scan and she also had ascites. Pt underwent paracentesis, cytology consistent with adenocarcinoma. Hospital course was complicated with ileus and gen surgery also consulted. Pt had NG tube from 7/1 - 7/2. During this hospital stay pt underwent neoadjuvant chemo which was given 6/28 through peripheral line.  Plan for [facility] placement today.71 year old female with hypertension, chronic right sided weakness from history of CVA. Patient presented to office 11/05/16 with RLE edema and right hip pain after a fall. Pt was subsequently found to have RLE DVT. Pt also noted to have extensive nodular implants consistent with carcinomatosis on CT scan and she also had ascites. Pt underwent paracentesis, cytology consistent with adenocarcinoma. Hospital course was complicated with ileus and gen surgery also consulted. Pt had NG tube from 7/1 - 7/2. During this hospital stay pt underwent neoadjuvant chemo which was given 6/28 through peripheral line.  Patient has had PAC place 7/6 by IR.   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  - Cytology consistent with adenocarcinoma - Neoadjuvant chemo started 6/28 - PAC placed 7/6 - Appreciate oncology following and their recommendation  - Pt not safe for discharge until [place] greater than 1.5  Active Problems: Bowel obstruction ([place]) - In the setting of peritoneal carcinomatosis - X ray 6/28 - no  obstruction  - NG tube pulled 7/2 - Still discussing with pt about TPN; pt declined   E.Coli UTI - Started rocephin 7/6  Acute gross hematuria / Anemia of chronic disease - Due to tumor invasion into bladder - No intervention per GU - Hgb 8.9 this am  Acute thromboembolism of deep veins of right lower extremity (HCC) - RLE doppler positive for DVT  - No DVT in LLE - No thrombectomy required per vascular surgery  - Pt is on Lovenox  - Monitor for bleeding   AKI - Due ot volume depletion, prerenal azotemia - Cr improved with fluids   Hyponatremia - Due to poor po intake, dehydration - Monitor daily BMP  Essential hypertension - Continue toprol  Severe protein calorie malnutrition - Due to ovarian ca - Declined TPN - On liquid diet     DVT prophylaxis: Lovenox subQ Code Status: DNR/DNI Family Communication: famil y not at bedside  Disposition Plan: not yet stable for discharge, still has nausea, needs better nutrition   Consultants:   Palliative care   Oncology, Dr. Alvy Bimler 11/15/2016  IR, Dr. Pascal Lux 11/14/2016  General surgery, Dr. Donne Hazel 11/14/2016  GYN oncology, Dr. Denman George 11/15/2016 and Dr. Delsa Sale 11/11/2016  Vascular surgery, Dr. Donzetta Matters 11/12/2016  Urologym, Dr. Jeffie Pollock 11/18/2016  PT  Nutrition  Procedures:   LE doppler 6/21 - widespread DVT in RLE; LLE doppler 6/30 - no DVT  Paracentesis 6/22 - 420 mg fluid removed   NG tube 7/1 - 7/2   Antimicrobials:   Rocephin 7/6 -->    Subjective: No overnight events.  Objective: Vitals:  11/25/16 1535 11/25/16 1602 11/25/16 2050 11/26/16 0509  BP: 90/74 (!) 90/59 112/69 (!) 100/58  Pulse: 77 74 97 92  Resp: 15 16 14 14   Temp:  98.1 F (36.7 C) 98.8 F (37.1 C) 97.9 F (36.6 C)  TempSrc:   Oral Oral  SpO2: 100% 98% 96% 95%  Weight:    93.6 kg (206 lb 5.6 oz)  Height:        Intake/Output Summary (Last 24 hours) at 11/26/16 1155 Last data filed at 11/26/16 0510  Gross per 24  hour  Intake          1441.25 ml  Output              500 ml  Net           941.25 ml   Filed Weights   11/23/16 0549 11/24/16 0550 11/26/16 0509  Weight: 90.5 kg (199 lb 9.6 oz) 92.8 kg (204 lb 9.4 oz) 93.6 kg (206 lb 5.6 oz)    Examination:  General exam: Appears calm and comfortable  Respiratory system: Clear to auscultation. Respiratory effort normal. Cardiovascular system: S1 & S2 heard, Rate controlled  Gastrointestinal system: Abdomen is nondistended, soft and nontender. No organomegaly or masses felt. Normal bowel sounds heard. Central nervous system: Alert and oriented. No focal neurological deficits. Extremities: swollen upper extremities Skin: warm, dry  Psychiatry: Normal mood and behavior    Data Reviewed: I have personally reviewed following labs and imaging studies  CBC:  Recent Labs Lab 11/22/16 0401 11/23/16 0319 11/24/16 0328 11/25/16 0349 11/26/16 0445  WBC 3.3* 2.3* 2.2* 0.8* 0.7*  NEUTROABS  --   --   --  0.3* 0.2*  HGB 11.2* 11.3* 12.1 9.9* 8.9*  HCT 34.5* 34.0* 34.9* 29.2* 25.7*  MCV 88.2 88.5 87.0 87.7 87.4  PLT 209 204 158 145* 323   Basic Metabolic Panel:  Recent Labs Lab 11/20/16 0426 11/22/16 0401 11/24/16 0328 11/24/16 1204 11/26/16 0445  NA 135 133* 131* 130* 128*  K 4.7 4.5 4.4 4.3 3.9  CL 105 103 99* 100* 98*  CO2 22 23 22 27 26   GLUCOSE 161* 130* 166* 184* 141*  BUN 16 10 11 11 13   CREATININE 0.81 0.54 0.69 0.79 0.83  CALCIUM 7.7* 7.6* 8.2* 7.4* 7.2*   GFR: Estimated Creatinine Clearance: 64.6 mL/min (by C-G formula based on SCr of 0.83 mg/dL). Liver Function Tests:  Recent Labs Lab 11/20/16 0426  AST 54*  ALT 28  ALKPHOS 104  BILITOT 0.7  PROT 5.1*  ALBUMIN 2.2*   No results for input(s): LIPASE, AMYLASE in the last 168 hours. No results for input(s): AMMONIA in the last 168 hours. Coagulation Profile:  Recent Labs Lab 11/25/16 0349  INR 1.06   Cardiac Enzymes: No results for input(s): CKTOTAL, CKMB,  CKMBINDEX, TROPONINI in the last 168 hours. BNP (last 3 results) No results for input(s): PROBNP in the last 8760 hours. HbA1C: No results for input(s): HGBA1C in the last 72 hours. CBG:  Recent Labs Lab 11/25/16 1155 11/25/16 1722 11/25/16 2047 11/26/16 0810 11/26/16 1142  GLUCAP 135* 122* 148* 143* 151*   Lipid Profile: No results for input(s): CHOL, HDL, LDLCALC, TRIG, CHOLHDL, LDLDIRECT in the last 72 hours. Thyroid Function Tests: No results for input(s): TSH, T4TOTAL, FREET4, T3FREE, THYROIDAB in the last 72 hours. Anemia Panel: No results for input(s): VITAMINB12, FOLATE, FERRITIN, TIBC, IRON, RETICCTPCT in the last 72 hours. Urine analysis:    Component Value Date/Time   COLORURINE  YELLOW 11/18/2016 1345   APPEARANCEUR CLEAR 11/18/2016 1345   LABSPEC 1.006 11/18/2016 1345   PHURINE 6.0 11/18/2016 1345   GLUCOSEU NEGATIVE 11/18/2016 1345   HGBUR LARGE (A) 11/18/2016 1345   BILIRUBINUR NEGATIVE 11/18/2016 1345   BILIRUBINUR negative 01/27/2016 1759   BILIRUBINUR neg 04/08/2012 1424   KETONESUR NEGATIVE 11/18/2016 1345   PROTEINUR NEGATIVE 11/18/2016 1345   UROBILINOGEN 0.2 01/27/2016 1759   UROBILINOGEN 1.0 01/06/2011 0008   NITRITE NEGATIVE 11/18/2016 1345   LEUKOCYTESUR MODERATE (A) 11/18/2016 1345   Sepsis Labs: @LABRCNTIP (procalcitonin:4,lacticidven:4)   ) Recent Results (from the past 240 hour(s))  Culture, Urine     Status: Abnormal   Collection Time: 11/18/16  1:45 PM  Result Value Ref Range Status   Specimen Description URINE, RANDOM  Final   Special Requests NONE  Final   Culture >=100,000 COLONIES/mL ESCHERICHIA COLI (A)  Final   Report Status 11/21/2016 FINAL  Final   Organism ID, Bacteria ESCHERICHIA COLI (A)  Final      Susceptibility   Escherichia coli - MIC*    AMPICILLIN 8 SENSITIVE Sensitive     CEFAZOLIN <=4 SENSITIVE Sensitive     CEFTRIAXONE <=1 SENSITIVE Sensitive     CIPROFLOXACIN <=0.25 SENSITIVE Sensitive     GENTAMICIN <=1  SENSITIVE Sensitive     IMIPENEM <=0.25 SENSITIVE Sensitive     NITROFURANTOIN 32 SENSITIVE Sensitive     TRIMETH/SULFA <=20 SENSITIVE Sensitive     AMPICILLIN/SULBACTAM 4 SENSITIVE Sensitive     PIP/TAZO <=4 SENSITIVE Sensitive     Extended ESBL NEGATIVE Sensitive     * >=100,000 COLONIES/mL ESCHERICHIA COLI      Radiology Studies: Ir US Guide Vasc Access Left  Result Date: 11/25/2016 INDICATION: 71 year old with peritoneal carcinomatosis. Port-A-Cath needed for treatment. The patient's white blood cell count is markedly low and abnormal. This was discussed with the patient's oncologist, Dr. Alvy Bimler, who felt the patient was safe for Port-A-Cath placement. EXAM: FLUOROSCOPIC AND ULTRASOUND GUIDED PLACEMENT OF A SUBCUTANEOUS PORT. Physician: Stephan Minister. Anselm Pancoast, MD MEDICATIONS: Cleocin 600 mg administered intravenously within one hour of incision. ANESTHESIA/SEDATION: Versed 1.0 mg IV; Fentanyl 25 mcg IV; Moderate Sedation Time:  40 minutes The patient was continuously monitored during the procedure by the interventional radiology nurse under my direct supervision. FLUOROSCOPY TIME:  54 seconds, 7 mGy COMPLICATIONS: None immediate. PROCEDURE: The risks of the procedure were explained to the patient. Informed consent was obtained. Patient was placed supine on the interventional table. Ultrasound confirmed a patent left internal jugular vein. The left chest and neck were cleaned with a skin antiseptic and a sterile drape was placed. Maximal barrier sterile technique was utilized including caps, mask, sterile gowns, sterile gloves, sterile drape, hand hygiene and skin antiseptic. The left neck was anesthetized with 1% lidocaine. Small incision was made in the left neck with a blade. Micropuncture set was placed in the left IJ with ultrasound guidance. The left chest was anesthetized with 1% lidocaine with epinephrine. #15 blade was used to make an incision and a subcutaneous port pocket was formed. [place] was selected. Subcutaneous tunnel was formed with a stiff tunneling device. The port catheter was brought through the subcutaneous tunnel. The micropuncture set was exchanged for a peel-away sheath. The catheter was placed through the peel-away sheath and the tip was positioned at the superior cavoatrial junction. Catheter placement was confirmed with fluoroscopy. The port was assembled and placed within the subcutaneous pocket. The port was accessed and flushed with  heparinized saline. The port pocket was closed using two layers of absorbable sutures and Dermabond. The vein skin site was closed using a single layer of absorbable suture and Dermabond. Sterile dressings were applied. Patient tolerated the procedure well without an immediate complication. Ultrasound and fluoroscopic images were taken and saved for this procedure. IMPRESSION: Placement of a subcutaneous port device. Electronically Signed   By: Markus Daft M.D.   On: 11/25/2016 15:53   Ir Fluoro Guide Port Insertion Left  Result Date: 11/25/2016 INDICATION: 71 year old with peritoneal carcinomatosis. Port-A-Cath needed for treatment. The patient's white blood cell count is markedly low and abnormal. This was discussed with the patient's oncologist, Dr. Alvy Bimler, who felt the patient was safe for Port-A-Cath placement. EXAM: FLUOROSCOPIC AND ULTRASOUND GUIDED PLACEMENT OF A SUBCUTANEOUS PORT. Physician: Stephan Minister. Anselm Pancoast, MD MEDICATIONS: Cleocin 600 mg administered intravenously within one hour of incision. ANESTHESIA/SEDATION: Versed 1.0 mg IV; Fentanyl 25 mcg IV; Moderate Sedation Time:  40 minutes The patient was continuously monitored during the procedure by the interventional radiology nurse under my direct supervision. FLUOROSCOPY TIME:  54 seconds, 7 mGy COMPLICATIONS: None immediate. PROCEDURE: The risks of the procedure were explained to the patient. Informed consent was obtained. Patient was placed supine on the interventional table.  Ultrasound confirmed a patent left internal jugular vein. The left chest and neck were cleaned with a skin antiseptic and a sterile drape was placed. Maximal barrier sterile technique was utilized including caps, mask, sterile gowns, sterile gloves, sterile drape, hand hygiene and skin antiseptic. The left neck was anesthetized with 1% lidocaine. Small incision was made in the left neck with a blade. Micropuncture set was placed in the left IJ with ultrasound guidance. The left chest was anesthetized with 1% lidocaine with epinephrine. #15 blade was used to make an incision and a subcutaneous port pocket was formed. [place] was selected. Subcutaneous tunnel was formed with a stiff tunneling device. The port catheter was brought through the subcutaneous tunnel. The micropuncture set was exchanged for a peel-away sheath. The catheter was placed through the peel-away sheath and the tip was positioned at the superior cavoatrial junction. Catheter placement was confirmed with fluoroscopy. The port was assembled and placed within the subcutaneous pocket. The port was accessed and flushed with heparinized saline. The port pocket was closed using two layers of absorbable sutures and Dermabond. The vein skin site was closed using a single layer of absorbable suture and Dermabond. Sterile dressings were applied. Patient tolerated the procedure well without an immediate complication. Ultrasound and fluoroscopic images were taken and saved for this procedure. IMPRESSION: Placement of a subcutaneous port device. Electronically Signed   By: Markus Daft M.D.   On: 11/25/2016 15:53        Scheduled Meds: . aspirin  81 mg Oral Daily  . bisacodyl  10 mg Rectal Daily  . enoxaparin (LOVENOX) injection  85 mg Subcutaneous Q12H  . feeding supplement  1 Container Oral BID  . feeding supplement (ENSURE ENLIVE)  237 mL Oral Q24H  . gabapentin  300 mg Oral Daily  . insulin aspart  0-5 Units Subcutaneous QHS  .  insulin aspart  0-9 Units Subcutaneous TID WC  . metoCLOPramide (REGLAN) injection  5 mg Intravenous Q8H  . metoprolol succinate  25 mg Oral Daily  . nystatin   Topical TID  . ondansetron (ZOFRAN) IV  4 mg Intravenous TID AC  . pantoprazole  40 mg Oral Q0600  . senna  2 tablet Oral  Daily  . Tbo-filgastrim (GRANIX) SQ  300 mcg Subcutaneous q1800   Continuous Infusions: . sodium chloride    . cefTRIAXone (ROCEPHIN)  IV Stopped (11/25/16 1656)  . dextrose 5 % and 0.45% NaCl 75 mL/hr at 11/25/16 2153     LOS: 16 days    Time spent: 15 minutes  Greater than 50% of the time spent on counseling and coordinating the care.   Leisa Lenz, MD [facility] Pager [phone]  If 7PM-7AM, please contact night-coverage [email or web address] Password [email or web address] 11/26/2016, 11:55 AM

## 2016-11-26 NOTE — Progress Notes (Signed)
ANTICOAGULATION CONSULT NOTE - Follow Up Consult  Pharmacy Consult for Lovenox Indication: DVT  Patient Measurements: Height: 5' 1.5" (156.2 cm) Weight: 206 lb 5.6 oz (93.6 kg) IBW/kg (Calculated) : 48.95   Vital Signs: Temp: 97.9 F (36.6 C) (07/07 0509) Temp Source: Oral (07/07 0509) BP: 100/58 (07/07 0509) Pulse Rate: 92 (07/07 0509)  Labs:  Recent Labs  11/24/16 0328 11/24/16 1204 11/25/16 0349 11/26/16 0445  HGB 12.1  --  9.9* 8.9*  HCT 34.9*  --  29.2* 25.7*  PLT 158  --  145* 157  LABPROT  --   --  13.8  --   INR  --   --  1.06  --   CREATININE 0.69 0.79  --  0.83    Estimated Creatinine Clearance: 64.6 mL/min (by C-G formula based on SCr of 0.83 mg/dL).  Assessment: 20 yoF with ovarian cancer, malignant SBO who was initially started on IV heparin for extensive DVTs. Transitioned to Lovenox 6/28 as no longer planning for Port-a-Cath placement for first cycle of chemotherapy. Recent hematuria noted on 6/25 which has resolved per MD notes. Renal function also stable.  Repeat dopplers 6/30 do not show new clot.   Today, 11/26/2016  PAC placed 7/6 by IR at ~15:30.  IR asked for pharmacy to resume enoxaparin 7/7  Renal function remains stable and WNL  Hgb dec to 8.9 and pltc WNL  Anemia and neutropenia noted s/p chemo  Weight increasing, palliative care note from 7/3 notes patient looks more swollen, 7/6 onc note states anasarca.  Dose LMWH using admission weight  Goal of Therapy:  Anti-Xa level 0.6-1 units/ml 4hrs after LMWH dose given Monitor platelets by anticoagulation protocol: Yes   Plan:   Resume Lovenox  85mg  (1mg /kg) SQ q12h  Follow CBC at least q72h.  Monitor renal function and adjust interval as needed  Monitor closely for s/sx bleeding  Doreene Eland, PharmD, BCPS.   Pager: [phone] 11/26/2016 8:09 AM

## 2016-11-27 LAB — BASIC METABOLIC PANEL
Anion gap: 7 (ref 5–15)
BUN: 13 mg/dL (ref 6–20)
CHLORIDE: 99 mmol/L — AB (ref 101–111)
CO2: 24 mmol/L (ref 22–32)
CREATININE: 0.9 mg/dL (ref 0.44–1.00)
Calcium: 7.3 mg/dL — ABNORMAL LOW (ref 8.9–10.3)
GFR calc Af Amer: >60 mL/min (ref >60)
GLUCOSE: 116 mg/dL — AB (ref 65–99)
POTASSIUM: 3.7 mmol/L (ref 3.5–5.1)
SODIUM: 130 mmol/L — AB (ref 135–145)

## 2016-11-27 LAB — CBC WITH DIFFERENTIAL/PLATELET
Basophils Absolute: 0 10*3/uL (ref 0.0–0.1)
Basophils Relative: 0 %
EOS ABS: 0.1 10*3/uL (ref 0.0–0.7)
EOS PCT: 4 %
HCT: 26.1 % — ABNORMAL LOW (ref 36.0–46.0)
Hemoglobin: 8.8 g/dL — ABNORMAL LOW (ref 12.0–15.0)
LYMPHS ABS: 0.4 10*3/uL — AB (ref 0.7–4.0)
LYMPHS PCT: 35 %
MCH: 29.1 pg (ref 26.0–34.0)
MCHC: 33.7 g/dL (ref 30.0–36.0)
MCV: 86.4 fL (ref 78.0–100.0)
Monocytes Absolute: 0.4 10*3/uL (ref 0.1–1.0)
Monocytes Relative: 38 %
Neutro Abs: 0.3 10*3/uL — ABNORMAL LOW (ref 1.7–7.7)
Neutrophils Relative %: 23 %
PLATELETS: 149 10*3/uL — AB (ref 150–400)
RBC: 3.02 MIL/uL — AB (ref 3.87–5.11)
RDW: 14.2 % (ref 11.5–15.5)
WBC: 1.2 10*3/uL — AB (ref 4.0–10.5)

## 2016-11-27 LAB — GLUCOSE, CAPILLARY
GLUCOSE-CAPILLARY: 132 mg/dL — AB (ref 65–99)
Glucose-Capillary: 121 mg/dL — ABNORMAL HIGH (ref 65–99)
Glucose-Capillary: 116 mg/dL — ABNORMAL HIGH (ref 65–99)
Glucose-Capillary: 138 mg/dL — ABNORMAL HIGH (ref 65–99)

## 2016-11-27 NOTE — Progress Notes (Signed)
Dr. Charlies Silvers called and informed of the port being tender,red and edematous.Also informed  It was de accessed. Pt states it's been hurting for a couple days, she thought that was how it was suppose to feel. Informed pt that it shouldn't hurt her.

## 2016-11-27 NOTE — Progress Notes (Addendum)
Patient ID: Annette Farmer, female   DOB: 07-Sep-1945, 71 y.o.   MRN: [id number]  PROGRESS NOTE    NETANYA YAZDANI  GUY:[id number] DOB: December 30, 1945 DOA: 11/10/2016  PCP: Wardell Honour, MD   Brief Narrative:  71 year old female with hypertension, chronic right sided weakness from history of CVA. Patient presented to office 11/05/16 with RLE edema and right hip pain after a fall. Pt was subsequently found to have RLE DVT. Pt also noted to have extensive nodular implants consistent with carcinomatosis on CT scan and she also had ascites. Pt underwent paracentesis, cytology consistent with adenocarcinoma. Hospital course was complicated with ileus and gen surgery also consulted. Pt had NG tube from 7/1 - 7/2. During this hospital stay pt underwent neoadjuvant chemo which was given 6/28 through peripheral line.  Plan for [facility] placement today.71 year old female with hypertension, chronic right sided weakness from history of CVA. Patient presented to office 11/05/16 with RLE edema and right hip pain after a fall. Pt was subsequently found to have RLE DVT. Pt also noted to have extensive nodular implants consistent with carcinomatosis on CT scan and she also had ascites. Pt underwent paracentesis, cytology consistent with adenocarcinoma. Hospital course was complicated with ileus and gen surgery also consulted. Pt had NG tube from 7/1 - 7/2. During this hospital stay pt underwent neoadjuvant chemo which was given 6/28 through peripheral line.  PAC place 7/6 by IR.   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  - Cytology consistent with adenocarcinoma - Neoadjuvant chemo started 6/28 - PAC placed 7/6   Active Problems: Bowel obstruction ([place]) - In the setting of peritoneal carcinomatosis - X ray 6/28 - no obstruction  - NG tube pulled 7/2 - Pt decline TPN  E.Coli UTI - On Ancef  Acute gross hematuria / Anemia of chronic  disease - Due to tumor invasion into bladder - No intervention per GU - Hgb stable  Acute thromboembolism of deep veins of right lower extremity (HCC) - RLE doppler positive for DVT  - No DVT in LLE - No thrombectomy required per vascular surgery  - Continue Lovenox   AKI - Due ot volume depletion, prerenal azotemia - Cr within normal limit   Hyponatremia - Due to GI losses, poor po intake  - Sodium 130  Essential hypertension - Continue Toprol   Severe protein calorie malnutrition - Due to ovarian ca - Continue full liquid diet     DVT prophylaxis: Lovenox subQ Code Status: full code  Family Communication: no family at the bedside this am Disposition Plan: SNF likely in next 48 hours    Consultants:   Palliative care   Oncology, Dr. Alvy Bimler 11/15/2016  IR, Dr. Pascal Lux 11/14/2016  General surgery, Dr. Donne Hazel 11/14/2016  GYN oncology, Dr. Denman George 11/15/2016 and Dr. Delsa Sale 11/11/2016  Vascular surgery, Dr. Donzetta Matters 11/12/2016  Urologym, Dr. Jeffie Pollock 11/18/2016  PT  Nutrition  Procedures:   LE doppler 6/21 - widespread DVT in RLE; LLE doppler 6/30 - no DVT  Paracentesis 6/22 - 420 mg fluid removed   NG tube 7/1 - 7/2   Antimicrobials:   Rocephin 7/6 --> 7/7  Ancef 7/7 -->   Subjective: No overnight events.  Objective: Vitals:   11/26/16 1437 11/27/16 0500 11/27/16 1353 11/27/16 1440  BP: (!) 101/50 107/60 110/71 110/71  Pulse: 97 90 97 97  Resp: 16 18 18    Temp: 97.9 F (36.6 C) 99  F (37.2 C) 98.4 F (36.9 C)   TempSrc: Oral Oral Oral   SpO2: 95% 93% 96%   Weight:  94 kg (207 lb 3.7 oz)    Height:        Intake/Output Summary (Last 24 hours) at 11/27/16 1611 Last data filed at 11/27/16 0731  Gross per 24 hour  Intake          1401.25 ml  Output              250 ml  Net          1151.25 ml   Filed Weights   11/24/16 0550 11/26/16 0509 11/27/16 0500  Weight: 92.8 kg (204 lb 9.4 oz) 93.6 kg (206 lb 5.6 oz) 94 kg (207 lb 3.7 oz)     Physical Exam  Constitutional: Appears well-developed and well-nourished. No distress.  CVS: RRR, S1/S2 + Pulmonary: Effort and breath sounds normal, no stridor Abdominal: Soft. BS +,  no distension, tenderness, rebound or guarding.  Musculoskeletal: Normal range of motion. No edema and no tenderness.  Lymphadenopathy: No lymphadenopathy noted, cervical, inguinal. Neuro: Alert. Normal reflexes, muscle tone coordination. No cranial nerve deficit. Skin: Skin is warm and dry. Swollen UE Psychiatric: Normal mood and affect.    Data Reviewed: I have personally reviewed following labs and imaging studies  CBC:  Recent Labs Lab 11/23/16 0319 11/24/16 0328 11/25/16 0349 11/26/16 0445 11/27/16 0615  WBC 2.3* 2.2* 0.8* 0.7* 1.2*  NEUTROABS  --   --  0.3* 0.2* 0.3*  HGB 11.3* 12.1 9.9* 8.9* 8.8*  HCT 34.0* 34.9* 29.2* 25.7* 26.1*  MCV 88.5 87.0 87.7 87.4 86.4  PLT 204 158 145* 157 962*   Basic Metabolic Panel:  Recent Labs Lab 11/22/16 0401 11/24/16 0328 11/24/16 1204 11/26/16 0445 11/27/16 0615  NA 133* 131* 130* 128* 130*  K 4.5 4.4 4.3 3.9 3.7  CL 103 99* 100* 98* 99*  CO2 23 22 27 26 24   GLUCOSE 130* 166* 184* 141* 116*  BUN 10 11 11 13 13   CREATININE 0.54 0.69 0.79 0.83 0.90  CALCIUM 7.6* 8.2* 7.4* 7.2* 7.3*   GFR: Estimated Creatinine Clearance: 59.8 mL/min (by C-G formula based on SCr of 0.9 mg/dL). Liver Function Tests: No results for input(s): AST, ALT, ALKPHOS, BILITOT, PROT, ALBUMIN in the last 168 hours. No results for input(s): LIPASE, AMYLASE in the last 168 hours. No results for input(s): AMMONIA in the last 168 hours. Coagulation Profile:  Recent Labs Lab 11/25/16 0349  INR 1.06   Cardiac Enzymes: No results for input(s): CKTOTAL, CKMB, CKMBINDEX, TROPONINI in the last 168 hours. BNP (last 3 results) No results for input(s): PROBNP in the last 8760 hours. HbA1C: No results for input(s): HGBA1C in the last 72 hours. CBG:  Recent  Labs Lab 11/26/16 1142 11/26/16 1713 11/26/16 2130 11/27/16 0748 11/27/16 1211  GLUCAP 151* 159* 127* 121* 116*   Lipid Profile: No results for input(s): CHOL, HDL, LDLCALC, TRIG, CHOLHDL, LDLDIRECT in the last 72 hours. Thyroid Function Tests: No results for input(s): TSH, T4TOTAL, FREET4, T3FREE, THYROIDAB in the last 72 hours. Anemia Panel: No results for input(s): VITAMINB12, FOLATE, FERRITIN, TIBC, IRON, RETICCTPCT in the last 72 hours. Urine analysis:    Component Value Date/Time   COLORURINE YELLOW 11/18/2016 [place] 11/18/2016 1345   LABSPEC 1.006 11/18/2016 1345   PHURINE 6.0 11/18/2016 1345   GLUCOSEU NEGATIVE 11/18/2016 1345   HGBUR LARGE (A) 11/18/2016 1345   BILIRUBINUR NEGATIVE 11/18/2016  [place] negative 01/27/2016 1759   BILIRUBINUR neg 04/08/2012 1424   [place] 11/18/2016 1345   PROTEINUR NEGATIVE 11/18/2016 1345   UROBILINOGEN 0.2 01/27/2016 1759   UROBILINOGEN 1.0 01/06/2011 0008   NITRITE NEGATIVE 11/18/2016 1345   LEUKOCYTESUR MODERATE (A) 11/18/2016 1345   Sepsis Labs: @LABRCNTIP (procalcitonin:4,lacticidven:4)   ) Recent Results (from the past 240 hour(s))  Culture, Urine     Status: Abnormal   Collection Time: 11/18/16  1:45 PM  Result Value Ref Range Status   Specimen Description URINE, RANDOM  Final   Special Requests NONE  Final   Culture >=100,000 COLONIES/mL ESCHERICHIA COLI (A)  Final   Report Status 11/21/2016 FINAL  Final   Organism ID, Bacteria ESCHERICHIA COLI (A)  Final      Susceptibility   Escherichia coli - MIC*    AMPICILLIN 8 SENSITIVE Sensitive     CEFAZOLIN <=4 SENSITIVE Sensitive     CEFTRIAXONE <=1 SENSITIVE Sensitive     CIPROFLOXACIN <=0.25 SENSITIVE Sensitive     GENTAMICIN <=1 SENSITIVE Sensitive     IMIPENEM <=0.25 SENSITIVE Sensitive     NITROFURANTOIN 32 SENSITIVE Sensitive     TRIMETH/SULFA <=20 SENSITIVE Sensitive     AMPICILLIN/SULBACTAM 4 SENSITIVE Sensitive      PIP/TAZO <=4 SENSITIVE Sensitive     Extended ESBL NEGATIVE Sensitive     * >=100,000 COLONIES/mL ESCHERICHIA COLI      Radiology Studies: Ir US Guide Vasc Access Left  Result Date: 11/25/2016 INDICATION: 71 year old with peritoneal carcinomatosis. Port-A-Cath needed for treatment. The patient's white blood cell count is markedly low and abnormal. This was discussed with the patient's oncologist, Dr. Alvy Bimler, who felt the patient was safe for Port-A-Cath placement. EXAM: FLUOROSCOPIC AND ULTRASOUND GUIDED PLACEMENT OF A SUBCUTANEOUS PORT. Physician: Stephan Minister. Anselm Pancoast, MD MEDICATIONS: Cleocin 600 mg administered intravenously within one hour of incision. ANESTHESIA/SEDATION: Versed 1.0 mg IV; Fentanyl 25 mcg IV; Moderate Sedation Time:  40 minutes The patient was continuously monitored during the procedure by the interventional radiology nurse under my direct supervision. FLUOROSCOPY TIME:  54 seconds, 7 mGy COMPLICATIONS: None immediate. PROCEDURE: The risks of the procedure were explained to the patient. Informed consent was obtained. Patient was placed supine on the interventional table. Ultrasound confirmed a patent left internal jugular vein. The left chest and neck were cleaned with a skin antiseptic and a sterile drape was placed. Maximal barrier sterile technique was utilized including caps, mask, sterile gowns, sterile gloves, sterile drape, hand hygiene and skin antiseptic. The left neck was anesthetized with 1% lidocaine. Small incision was made in the left neck with a blade. Micropuncture set was placed in the left IJ with ultrasound guidance. The left chest was anesthetized with 1% lidocaine with epinephrine. #15 blade was used to make an incision and a subcutaneous port pocket was formed. [place] was selected. Subcutaneous tunnel was formed with a stiff tunneling device. The port catheter was brought through the subcutaneous tunnel. The micropuncture set was exchanged for a peel-away  sheath. The catheter was placed through the peel-away sheath and the tip was positioned at the superior cavoatrial junction. Catheter placement was confirmed with fluoroscopy. The port was assembled and placed within the subcutaneous pocket. The port was accessed and flushed with heparinized saline. The port pocket was closed using two layers of absorbable sutures and Dermabond. The vein skin site was closed using a single layer of absorbable suture and Dermabond. Sterile dressings were applied. Patient tolerated the procedure well  without an immediate complication. Ultrasound and fluoroscopic images were taken and saved for this procedure. IMPRESSION: Placement of a subcutaneous port device. Electronically Signed   By: Markus Daft M.D.   On: 11/25/2016 15:53   Ir Fluoro Guide Port Insertion Left  Result Date: 11/25/2016 INDICATION: 71 year old with peritoneal carcinomatosis. Port-A-Cath needed for treatment. The patient's white blood cell count is markedly low and abnormal. This was discussed with the patient's oncologist, Dr. Alvy Bimler, who felt the patient was safe for Port-A-Cath placement. EXAM: FLUOROSCOPIC AND ULTRASOUND GUIDED PLACEMENT OF A SUBCUTANEOUS PORT. Physician: Stephan Minister. Anselm Pancoast, MD MEDICATIONS: Cleocin 600 mg administered intravenously within one hour of incision. ANESTHESIA/SEDATION: Versed 1.0 mg IV; Fentanyl 25 mcg IV; Moderate Sedation Time:  40 minutes The patient was continuously monitored during the procedure by the interventional radiology nurse under my direct supervision. FLUOROSCOPY TIME:  54 seconds, 7 mGy COMPLICATIONS: None immediate. PROCEDURE: The risks of the procedure were explained to the patient. Informed consent was obtained. Patient was placed supine on the interventional table. Ultrasound confirmed a patent left internal jugular vein. The left chest and neck were cleaned with a skin antiseptic and a sterile drape was placed. Maximal barrier sterile technique was utilized  including caps, mask, sterile gowns, sterile gloves, sterile drape, hand hygiene and skin antiseptic. The left neck was anesthetized with 1% lidocaine. Small incision was made in the left neck with a blade. Micropuncture set was placed in the left IJ with ultrasound guidance. The left chest was anesthetized with 1% lidocaine with epinephrine. #15 blade was used to make an incision and a subcutaneous port pocket was formed. [place] was selected. Subcutaneous tunnel was formed with a stiff tunneling device. The port catheter was brought through the subcutaneous tunnel. The micropuncture set was exchanged for a peel-away sheath. The catheter was placed through the peel-away sheath and the tip was positioned at the superior cavoatrial junction. Catheter placement was confirmed with fluoroscopy. The port was assembled and placed within the subcutaneous pocket. The port was accessed and flushed with heparinized saline. The port pocket was closed using two layers of absorbable sutures and Dermabond. The vein skin site was closed using a single layer of absorbable suture and Dermabond. Sterile dressings were applied. Patient tolerated the procedure well without an immediate complication. Ultrasound and fluoroscopic images were taken and saved for this procedure. IMPRESSION: Placement of a subcutaneous port device. Electronically Signed   By: Markus Daft M.D.   On: 11/25/2016 15:53        Scheduled Meds: . aspirin  81 mg Oral Daily  . bisacodyl  10 mg Rectal Daily  . enoxaparin (LOVENOX) injection  85 mg Subcutaneous Q12H  . feeding supplement  1 Container Oral BID  . feeding supplement (ENSURE ENLIVE)  237 mL Oral Q24H  . gabapentin  300 mg Oral Daily  . insulin aspart  0-5 Units Subcutaneous QHS  . insulin aspart  0-9 Units Subcutaneous TID WC  . metoCLOPramide (REGLAN) injection  5 mg Intravenous Q8H  . metoprolol succinate  25 mg Oral Daily  . nystatin   Topical TID  . ondansetron (ZOFRAN)  IV  4 mg Intravenous TID AC  . pantoprazole  40 mg Oral Q0600  . senna  2 tablet Oral Daily  . Tbo-filgastrim (GRANIX) SQ  300 mcg Subcutaneous q1800   Continuous Infusions: . sodium chloride    .  ceFAZolin (ANCEF) IV 1 g (11/27/16 1441)  . dextrose 5 % and 0.9% NaCl 75  mL/hr at 11/27/16 0731     LOS: 17 days    Time spent: 15 minutes  Greater than 50% of the time spent on counseling and coordinating the care.   Leisa Lenz, MD [facility] Pager [phone]  If 7PM-7AM, please contact night-coverage [email or web address] Password [email or web address] 11/27/2016, 4:11 PM

## 2016-11-28 LAB — CBC WITH DIFFERENTIAL/PLATELET
BASOS ABS: 0 10*3/uL (ref 0.0–0.1)
Basophils Relative: 0 %
EOS ABS: 0.1 10*3/uL (ref 0.0–0.7)
Eosinophils Relative: 3 %
HCT: 28.2 % — ABNORMAL LOW (ref 36.0–46.0)
Hemoglobin: 9.5 g/dL — ABNORMAL LOW (ref 12.0–15.0)
LYMPHS ABS: 0.8 10*3/uL (ref 0.7–4.0)
Lymphocytes Relative: 25 %
MCH: 29 pg (ref 26.0–34.0)
MCHC: 33.7 g/dL (ref 30.0–36.0)
MCV: 86 fL (ref 78.0–100.0)
Monocytes Absolute: 0.8 10*3/uL (ref 0.1–1.0)
Monocytes Relative: 24 %
NEUTROS ABS: 1.5 10*3/uL — AB (ref 1.7–7.7)
Neutrophils Relative %: 48 %
PLATELETS: 168 10*3/uL (ref 150–400)
RBC: 3.28 MIL/uL — AB (ref 3.87–5.11)
RDW: 14 % (ref 11.5–15.5)
WBC: 3.2 10*3/uL — AB (ref 4.0–10.5)

## 2016-11-28 LAB — GLUCOSE, CAPILLARY
Glucose-Capillary: 107 mg/dL — ABNORMAL HIGH (ref 65–99)
Glucose-Capillary: 118 mg/dL — ABNORMAL HIGH (ref 65–99)
Glucose-Capillary: 156 mg/dL — ABNORMAL HIGH (ref 65–99)

## 2016-11-28 MED ORDER — TRAMADOL HCL 50 MG PO TABS: 50.0000 mg | ORAL_TABLET | Freq: Four times a day (QID) | ORAL | 0 refills | Status: DC | PRN

## 2016-11-28 NOTE — Progress Notes (Signed)
Palliative care progress note  Chart reviewed.  Met with patient and discussed with care management and social work.  Plan for d/c to SNF when bed found. I would recommend consideration for palliative care to follow at SNF to continue to monitor disease trajectory and assist in decision making based on her clinical course.  She reports being agreeable to this.  Please include this on the discharge summary if you feel appropriate.  NO CHARGE NOTE  Micheline Rough, MD [place] Team [phone]

## 2016-11-28 NOTE — Progress Notes (Signed)
Physical Therapy Treatment Patient Details Name: Annette Farmer MRN: [id number] DOB: [birth year] Today's Date: 11/28/2016    History of Present Illness Pt is a 71 y.o. female with medical history significant of HTN, DM type II, CVA, and depression. She presented with complaints of right leg swelling over the last 1 week.  Ultrasound revealed extensive clot RLE. Abdominal CT revealed right adnexal tumour with carcinomatosis. Plan for aracentesis on 6/29; cytology is still pending; however, she will likely require an omental biopsy for diagnosis. Palliative care mtg 6/27.    PT Comments    Pt required increased assistance on today compared to last session. Pt appears weak and fatigued. Multiple loose bowel movements during session requiring total assist for hygiene. Will continue to follow and progress a activity as tolerated. Recommend continued rehab at [facility].     Follow Up Recommendations  SNF     Equipment Recommendations  None recommended by PT    Recommendations for Other Services       Precautions / Restrictions Precautions Precautions: Fall Precaution Comments: CHEMO Restrictions Weight Bearing Restrictions: No    Mobility  Bed Mobility Overal bed mobility: Needs Assistance Bed Mobility: Rolling;Sidelying to Sit;Sit to Supine Rolling: Min assist Sidelying to sit: Mod assist   Sit to supine: Mod assist   General bed mobility comments: Increased assistance required on today. Assist for trunk and LEs. Increased time. Pt relied on bedrail  Transfers Overall transfer level: Needs assistance Equipment used: Rolling walker (2 wheeled) Transfers: Sit to/from Stand Sit to Stand: Mod assist;From elevated surface         General transfer comment: x3. Pt stood at least ~20 seconds each time for hygience, strengthening. VCs safety, technique, hand placement. Pt declined pivot to recliner  Ambulation/Gait Ambulation/Gait assistance: Mod assist           General Gait  Details: Pt took a couple of side steps along side of bed with RW. She fatigues quickly. Too weak, fatigued to safely attempt any steps away from bed during this session.    Stairs            Wheelchair Mobility    Modified Rankin (Stroke Patients Only)       Balance Overall balance assessment: Needs assistance         Standing balance support: Bilateral upper extremity supported Standing balance-Leahy Scale: Poor                              Cognition Arousal/Alertness: Awake/alert Behavior During Therapy: WFL for tasks assessed/performed Overall Cognitive Status: Within Functional Limits for tasks assessed Area of Impairment: Safety/judgement                         Safety/Judgement: Decreased awareness of safety     General Comments: requires encouragement      Exercises      General Comments        Pertinent Vitals/Pain Pain Assessment: Faces Faces Pain Scale: Hurts even more Pain Location: perineal area Pain Descriptors / Indicators: Sore Pain Intervention(s): Monitored during session;Repositioned    Home Living                      Prior Function            PT Goals (current goals can now be found in the care plan section) Progress towards PT goals: Progressing toward goals  Frequency    Min 3X/week      PT Plan Current plan remains appropriate    Co-evaluation              AM-PAC PT "6 Clicks" Daily Activity  Outcome Measure  Difficulty turning over in bed (including adjusting bedclothes, sheets and blankets)?: Total Difficulty moving from lying on back to sitting on the side of the bed? : Total Difficulty sitting down on and standing up from a chair with arms (e.g., wheelchair, bedside commode, etc,.)?: Total Help needed moving to and from a bed to chair (including a wheelchair)?: A Lot Help needed walking in hospital room?: A Lot Help needed climbing 3-5 steps with a railing? : Total 6  Click Score: 8    End of Session Equipment Utilized During Treatment: Gait belt Activity Tolerance: Patient limited by fatigue Patient left: in bed;with call bell/phone within reach;with bed alarm set   PT Visit Diagnosis: Muscle weakness (generalized) (M62.81);Difficulty in walking, not elsewhere classified (R26.2)     Time: 3435-6861 PT Time Calculation (min) (ACUTE ONLY): 33 min  Charges:  $Therapeutic Activity: 23-37 mins                    G Codes:          Weston Anna, MPT Pager: [phone]

## 2016-11-28 NOTE — Progress Notes (Addendum)
Pt continues to have very edematous hands,legs and feet. Feet and hands elevated. Peri area also redden and edematous,Nystatin applied after peri care done. Turned and repositioned when pt would allow.

## 2016-11-28 NOTE — Care Management Important Message (Signed)
Important Message  Patient Details  Name: Annette Farmer MRN: [id number] Date of Birth: 01-06-1946   Medicare Important Message Given:  Yes    [name] 11/28/2016, 9:56 AMImportant Message  Patient Details  Name: Annette Farmer MRN: [id number] Date of Birth: 1946/02/26   Medicare Important Message Given:  Yes    [name] 11/28/2016, 9:56 AM

## 2016-11-28 NOTE — Progress Notes (Signed)
VERLEY PARISEAU   DOB:[birth year]   YQ#:[id number]    Subjective: She had port placement last week.  She complained of significant bruising, needle was removed No reported fever or chills.  She is quite immobile and debilitated.  She is able to tolerate liquid diet She had mild diarrhea   Objective:  Vitals:   11/27/16 2035 11/28/16 0625  BP: 93/60 100/63  Pulse: 77 77  Resp: 18 18  Temp: 98.1 F (36.7 C) 98.7 F (37.1 C)     Intake/Output Summary (Last 24 hours) at 11/28/16 0838 Last data filed at 11/28/16 0600  Gross per 24 hour  Intake          2126.25 ml  Output              650 ml  Net          1476.25 ml    GENERAL:alert, no distress and comfortable.  She looks elderly and debilitated SKIN: Noted skin bruising around the port site  EYES: normal, Conjunctiva are pink and non-injected, sclera clear OROPHARYNX:no exudate, no erythema and lips, buccal mucosa, and tongue normal  NECK: supple, thyroid normal size, non-tender, without nodularity LYMPH:  no palpable lymphadenopathy in the cervical, axillary or inguinal LUNGS: clear to auscultation and percussion with normal breathing effort HEART: regular rate & rhythm and no murmurs and no lower extremity edema ABDOMEN:abdomen soft, non-tender and normal bowel sounds.  It appeared distended Musculoskeletal:no cyanosis of digits and no clubbing  NEURO: alert & oriented x 3 with fluent speech, no focal motor/sensory deficits   Labs:  Lab Results  Component Value Date   WBC 3.2 (L) 11/28/2016   HGB 9.5 (L) 11/28/2016   HCT 28.2 (L) 11/28/2016   MCV 86.0 11/28/2016   PLT 168 11/28/2016   NEUTROABS 1.5 (L) 11/28/2016    Lab Results  Component Value Date   NA 130 (L) 11/27/2016   K 3.7 11/27/2016   CL 99 (L) 11/27/2016   CO2 24 11/27/2016   Assessment & Plan:   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 I agree with neoadjuvant chemotherapy approach She appears very frail and is  unlikely able to withstand primary surgical resection especially with recent diagnosis of extensive DVT, making primary debulking surgery somewhat risky  She received cycle 1 of treatment on 11/17/2016, next due on 7/19 in the outpatient  Acquired pancytopenia Due to hospitalization, I am not able to prescribe Neulasta support Due to pancytopenia, I will initiate daily Granix until [place] is greater than 1.5. Will give one last dose today.  She has responded well to G-CSF She does not need blood transfusion unless hemoglobin is less than 8  Mild GI obstructive symptoms/ileus This is likely due to carcinomatosis Continue conservative approach Continue to advance her diet as tolerated  Extensive DVT Due to untreated cancer I do not feel it is necessary for IVC filter placement She is on Lovenox.  I recommend we continue the same  Strong family history of cancer I would refer her to see genetic counselor after discharge  Diabetes and history of stroke The patient would be at high risk of severe hyperglycemia after treatment I recommend first dose of chemo to be given in the inpatient setting with close monitoring of her blood sugar afterwards I will reduce her baseline aspirin therapy from 325 mg daily to 81 mg daily  Poor social circumstances  The patient is very adamant she would not go to skilled nursing facility She claims  to have close friends at church who would be willing to provide transportation back and forth to the cancer center for treatment Please see documentation by palliative care team dated 11/16/2016. [employer] friends have significant concerns about safety being at home We will continue to work with palliative care team and case management for discharge planning  Moderate to severe protein calorie malnutrition Due to poor oral intake and malignancy The patient has declined TPN  Goals of care Her disease is highly treatable, potentially curable with aggressive  management The patient has living will at home [place] is changed to DNR per discussion with palliative care service  Discharge planning She has responded to G-CSF I would defer to social worker and team for discharge planning I will set up a return appointment in the outpatient clinic next week  Heath Lark, MD 11/28/2016  8:38 AM

## 2016-11-28 NOTE — Progress Notes (Addendum)
Was notified that pt's SNF choice, [facility], unable to offer pt bed.  Discussed DC plans at length with pt. Pt very reluctant to consider other SNF options but admits her home is not optimal to navigate at her current mobility level and need for assistance with ADLs.  Pt agreed to referrals to area SNFs and agreed to move forward if another SNF able to accept her. Discussed possibility that pt's care may be deemed custodial by insurance, and pt agreed she will pay privately if insurance request denied.  Made referrals to area facilities. Updated Attending. Will follow.   Sharren Bridge, MSW, LCSW Clinical Social Work 11/28/2016 [phone]  Pt received bed offer at [facility]. Confirmed with facility and initiated [facility] SNF authorization request. Representative stated medical director will review and advised "peer-to-peer request may be required as pt did not participate in therapy for several days" and states insurance representative will call CSW back with decision if authorized or if peer-to-peer requested.

## 2016-11-28 NOTE — Progress Notes (Signed)
CSW following for disposition/ DC planning. Plan for pt to transfer to SNF for ST rehab at DC has been in progress for several days. Pt wants [facility] and unwilling to consider other facilities.  CSW made contact with admissions office who advises that due to several days and changes in medical issues since initial acceptance, admissions will be re-reviewing pt's information today and will advise as to whether facility will be able to accept pt for ST rehab.  Once facility confirms, will need updated insurance pre-authorization.  Updated attending. Will follow.  Sharren Bridge, MSW, LCSW Clinical Social Work 11/28/2016 [phone]

## 2016-11-28 NOTE — Progress Notes (Signed)
Patient ID: Annette Farmer, female   DOB: [birth year], 71 y.o.   MRN: [id number]  PROGRESS NOTE    JOLYSSA OPLINGER  VEL:[id number] DOB: 01-07-1946 DOA: 11/10/2016  PCP: Wardell Honour, MD   Brief Narrative:  71 year old female with hypertension, chronic right sided weakness from history of CVA. Patient presented to office 11/05/16 with RLE edema and right hip pain after a fall. Pt was subsequently found to have RLE DVT. Pt also noted to have extensive nodular implants consistent with carcinomatosis on CT scan and she also had ascites. Pt underwent paracentesis, cytology consistent with adenocarcinoma. Hospital course was complicated with ileus and gen surgery also consulted. Pt had NG tube from 7/1 - 7/2. During this hospital stay pt underwent neoadjuvant chemo which was given 6/28 through peripheral line.  Plan for [facility] placement today.71 year old female with hypertension, chronic right sided weakness from history of CVA. Patient presented to office 11/05/16 with RLE edema and right hip pain after a fall. Pt was subsequently found to have RLE DVT. Pt also noted to have extensive nodular implants consistent with carcinomatosis on CT scan and she also had ascites. Pt underwent paracentesis, cytology consistent with adenocarcinoma. Hospital course was complicated with ileus and gen surgery also consulted. Pt had NG tube from 7/1 - 7/2. During this hospital stay pt underwent neoadjuvant chemo which was given 6/28 through peripheral line.  PAC place 7/6 by IR.   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  - Cytology consistent with adenocarcinoma - Neoadjuvant chemo started 6/28 - PAC placed 7/6; surrounding erythema noted since 7/8, per IR continue to monitor the site, use peripheral IV for access and not PAC - Stable for D/C once ANC above 1.2 - Will get one last dose Neupogen today   Active Problems: Bowel obstruction  (HCC) - In the setting of peritoneal carcinomatosis - X ray 6/28 - no obstruction  - NG tube pulled 7/2 - Pt declined TPN  E.Coli UTI - Pt on cefazolin  Acute gross hematuria / Anemia of chronic disease - Due to tumor invasion into bladder - No intervention per GU - Hgb stable  Acute thromboembolism of deep veins of right lower extremity (HCC) - RLE doppler positive for DVT  - No DVT in LLE - No thrombectomy required per vascular surgery  - Continue Lovenox per oncology  AKI - Due ot volume depletion, prerenal azotemia - Cr within normal limit   Hyponatremia - Due to GI losses, poor po intake  - Sodium stable  Essential hypertension - Continue Toprol   Severe protein calorie malnutrition - Due to ovarian ca - On full liquid diet    DVT prophylaxis: Lovenox subQ Code Status: full code  Family Communication: no family at the bedside this am Disposition Plan: insurance authorization pending for SNF placement    Consultants:   Palliative care   Oncology, Dr. Alvy Bimler 11/15/2016  IR, Dr. Pascal Lux 11/14/2016  General surgery, Dr. Donne Hazel 11/14/2016  GYN oncology, Dr. Denman George 11/15/2016 and Dr. Delsa Sale 11/11/2016  Vascular surgery, Dr. Donzetta Matters 11/12/2016  Urologym, Dr. Jeffie Pollock 11/18/2016  PT  Nutrition  Procedures:  LE doppler 6/21 - widespread DVT in RLE; LLE doppler 6/30 - no DVT  Paracentesis 6/22 - 420 mg fluid removed   NG tube 7/1 - 7/2   Antimicrobials:   Rocephin 7/6 --> 7/7  Ancef 7/7 -->  Subjective: No overnight events.   Objective:  Vitals:   11/27/16 2035 11/28/16 0625 11/28/16 1420 11/28/16 1628  BP: 93/60 100/63 (!) 92/57 92/74  Pulse: 77 77 87 90  Resp: 18 18  19   Temp: 98.1 F (36.7 C) 98.7 F (37.1 C)  98.1 F (36.7 C)  TempSrc: Oral Oral  Oral  SpO2: 97% 94%  97%  Weight:  93 kg (205 lb 0.4 oz)    Height:        Intake/Output Summary (Last 24 hours) at 11/28/16 1648 Last data filed at 11/28/16 1432   Gross per 24 hour  Intake          1838.75 ml  Output              500 ml  Net          1338.75 ml   Filed Weights   11/26/16 0509 11/27/16 0500 11/28/16 0625  Weight: 93.6 kg (206 lb 5.6 oz) 94 kg (207 lb 3.7 oz) 93 kg (205 lb 0.4 oz)    Examination:  General exam: Appears calm and comfortable  Respiratory system: Clear to auscultation. Respiratory effort normal. Cardiovascular system: S1 & S2 heard, RRR.  Gastrointestinal system: Abdomen is nondistended, soft and nontender. No organomegaly or masses felt. Normal bowel sounds heard. Central nervous system: Alert and oriented. No focal neurological deficits. Extremities: UE swollen; port-a-cath site redness surrounding it  Skin: No rashes, lesions or ulcers Psychiatry: Judgement and insight appear normal. Mood & affect appropriate.   Data Reviewed: I have personally reviewed following labs and imaging studies  CBC:  Recent Labs Lab 11/24/16 0328 11/25/16 0349 11/26/16 0445 11/27/16 0615 11/28/16 0352  WBC 2.2* 0.8* 0.7* 1.2* 3.2*  NEUTROABS  --  0.3* 0.2* 0.3* 1.5*  HGB 12.1 9.9* 8.9* 8.8* 9.5*  HCT 34.9* 29.2* 25.7* 26.1* 28.2*  MCV 87.0 87.7 87.4 86.4 86.0  PLT 158 145* 157 149* 397   Basic Metabolic Panel:  Recent Labs Lab 11/22/16 0401 11/24/16 0328 11/24/16 1204 11/26/16 0445 11/27/16 0615  NA 133* 131* 130* 128* 130*  K 4.5 4.4 4.3 3.9 3.7  CL 103 99* 100* 98* 99*  CO2 23 22 27 26 24   GLUCOSE 130* 166* 184* 141* 116*  BUN 10 11 11 13 13   CREATININE 0.54 0.69 0.79 0.83 0.90  CALCIUM 7.6* 8.2* 7.4* 7.2* 7.3*   GFR: Estimated Creatinine Clearance: 59.4 mL/min (by C-G formula based on SCr of 0.9 mg/dL). Liver Function Tests: No results for input(s): AST, ALT, ALKPHOS, BILITOT, PROT, ALBUMIN in the last 168 hours. No results for input(s): LIPASE, AMYLASE in the last 168 hours. No results for input(s): AMMONIA in the last 168 hours. Coagulation Profile:  Recent Labs Lab 11/25/16 0349  INR 1.06    Cardiac Enzymes: No results for input(s): CKTOTAL, CKMB, CKMBINDEX, TROPONINI in the last 168 hours. BNP (last 3 results) No results for input(s): PROBNP in the last 8760 hours. HbA1C: No results for input(s): HGBA1C in the last 72 hours. CBG:  Recent Labs Lab 11/27/16 1211 11/27/16 1726 11/27/16 2132 11/28/16 0752 11/28/16 1221  GLUCAP 116* 132* 138* 107* 118*   Lipid Profile: No results for input(s): CHOL, HDL, LDLCALC, TRIG, CHOLHDL, LDLDIRECT in the last 72 hours. Thyroid Function Tests: No results for input(s): TSH, T4TOTAL, FREET4, T3FREE, THYROIDAB in the last 72 hours. Anemia Panel: No results for input(s): VITAMINB12, FOLATE, FERRITIN, TIBC, IRON, RETICCTPCT in the last 72 hours. Urine analysis:    Component Value Date/Time   COLORURINE YELLOW 11/18/2016 1345  APPEARANCEUR CLEAR 11/18/2016 1345   LABSPEC 1.006 11/18/2016 1345   PHURINE 6.0 11/18/2016 1345   GLUCOSEU NEGATIVE 11/18/2016 1345   HGBUR LARGE (A) 11/18/2016 1345   BILIRUBINUR NEGATIVE 11/18/2016 1345   BILIRUBINUR negative 01/27/2016 1759   BILIRUBINUR neg 04/08/2012 1424   KETONESUR NEGATIVE 11/18/2016 1345   PROTEINUR NEGATIVE 11/18/2016 1345   UROBILINOGEN 0.2 01/27/2016 1759   UROBILINOGEN 1.0 01/06/2011 0008   NITRITE NEGATIVE 11/18/2016 1345   LEUKOCYTESUR MODERATE (A) 11/18/2016 1345   Sepsis Labs: @LABRCNTIP (procalcitonin:4,lacticidven:4)   )No results found for this or any previous visit (from the past 240 hour(s)).    Radiology Studies: Ir US Guide Vasc Access Left  Result Date: 11/25/2016 INDICATION: 71 year old with peritoneal carcinomatosis. Port-A-Cath needed for treatment. The patient's white blood cell count is markedly low and abnormal. This was discussed with the patient's oncologist, Dr. Alvy Bimler, who felt the patient was safe for Port-A-Cath placement. EXAM: FLUOROSCOPIC AND ULTRASOUND GUIDED PLACEMENT OF A SUBCUTANEOUS PORT. Physician: Stephan Minister. Anselm Pancoast, MD MEDICATIONS:  Cleocin 600 mg administered intravenously within one hour of incision. ANESTHESIA/SEDATION: Versed 1.0 mg IV; Fentanyl 25 mcg IV; Moderate Sedation Time:  40 minutes The patient was continuously monitored during the procedure by the interventional radiology nurse under my direct supervision. FLUOROSCOPY TIME:  54 seconds, 7 mGy COMPLICATIONS: None immediate. PROCEDURE: The risks of the procedure were explained to the patient. Informed consent was obtained. Patient was placed supine on the interventional table. Ultrasound confirmed a patent left internal jugular vein. The left chest and neck were cleaned with a skin antiseptic and a sterile drape was placed. Maximal barrier sterile technique was utilized including caps, mask, sterile gowns, sterile gloves, sterile drape, hand hygiene and skin antiseptic. The left neck was anesthetized with 1% lidocaine. Small incision was made in the left neck with a blade. Micropuncture set was placed in the left IJ with ultrasound guidance. The left chest was anesthetized with 1% lidocaine with epinephrine. #15 blade was used to make an incision and a subcutaneous port pocket was formed. [place] was selected. Subcutaneous tunnel was formed with a stiff tunneling device. The port catheter was brought through the subcutaneous tunnel. The micropuncture set was exchanged for a peel-away sheath. The catheter was placed through the peel-away sheath and the tip was positioned at the superior cavoatrial junction. Catheter placement was confirmed with fluoroscopy. The port was assembled and placed within the subcutaneous pocket. The port was accessed and flushed with heparinized saline. The port pocket was closed using two layers of absorbable sutures and Dermabond. The vein skin site was closed using a single layer of absorbable suture and Dermabond. Sterile dressings were applied. Patient tolerated the procedure well without an immediate complication. Ultrasound and  fluoroscopic images were taken and saved for this procedure. IMPRESSION: Placement of a subcutaneous port device. Electronically Signed   By: Markus Daft M.D.   On: 11/25/2016 15:53   Ir Fluoro Guide Port Insertion Left  Result Date: 11/25/2016 INDICATION: 71 year old with peritoneal carcinomatosis. Port-A-Cath needed for treatment. The patient's white blood cell count is markedly low and abnormal. This was discussed with the patient's oncologist, Dr. Alvy Bimler, who felt the patient was safe for Port-A-Cath placement. EXAM: FLUOROSCOPIC AND ULTRASOUND GUIDED PLACEMENT OF A SUBCUTANEOUS PORT. Physician: Stephan Minister. Anselm Pancoast, MD MEDICATIONS: Cleocin 600 mg administered intravenously within one hour of incision. ANESTHESIA/SEDATION: Versed 1.0 mg IV; Fentanyl 25 mcg IV; Moderate Sedation Time:  40 minutes The patient was continuously monitored during the procedure by  the interventional radiology nurse under my direct supervision. FLUOROSCOPY TIME:  54 seconds, 7 mGy COMPLICATIONS: None immediate. PROCEDURE: The risks of the procedure were explained to the patient. Informed consent was obtained. Patient was placed supine on the interventional table. Ultrasound confirmed a patent left internal jugular vein. The left chest and neck were cleaned with a skin antiseptic and a sterile drape was placed. Maximal barrier sterile technique was utilized including caps, mask, sterile gowns, sterile gloves, sterile drape, hand hygiene and skin antiseptic. The left neck was anesthetized with 1% lidocaine. Small incision was made in the left neck with a blade. Micropuncture set was placed in the left IJ with ultrasound guidance. The left chest was anesthetized with 1% lidocaine with epinephrine. #15 blade was used to make an incision and a subcutaneous port pocket was formed. [place] was selected. Subcutaneous tunnel was formed with a stiff tunneling device. The port catheter was brought through the subcutaneous tunnel. The  micropuncture set was exchanged for a peel-away sheath. The catheter was placed through the peel-away sheath and the tip was positioned at the superior cavoatrial junction. Catheter placement was confirmed with fluoroscopy. The port was assembled and placed within the subcutaneous pocket. The port was accessed and flushed with heparinized saline. The port pocket was closed using two layers of absorbable sutures and Dermabond. The vein skin site was closed using a single layer of absorbable suture and Dermabond. Sterile dressings were applied. Patient tolerated the procedure well without an immediate complication. Ultrasound and fluoroscopic images were taken and saved for this procedure. IMPRESSION: Placement of a subcutaneous port device. Electronically Signed   By: Markus Daft M.D.   On: 11/25/2016 15:53        Scheduled Meds: . aspirin  81 mg Oral Daily  . bisacodyl  10 mg Rectal Daily  . enoxaparin (LOVENOX) injection  85 mg Subcutaneous Q12H  . feeding supplement  1 Container Oral BID  . feeding supplement (ENSURE ENLIVE)  237 mL Oral Q24H  . gabapentin  300 mg Oral Daily  . insulin aspart  0-5 Units Subcutaneous QHS  . insulin aspart  0-9 Units Subcutaneous TID WC  . metoCLOPramide (REGLAN) injection  5 mg Intravenous Q8H  . metoprolol succinate  25 mg Oral Daily  . nystatin   Topical TID  . ondansetron (ZOFRAN) IV  4 mg Intravenous TID AC  . pantoprazole  40 mg Oral Q0600  . senna  2 tablet Oral Daily  . Tbo-filgastrim (GRANIX) SQ  300 mcg Subcutaneous q1800   Continuous Infusions: . sodium chloride    .  ceFAZolin (ANCEF) IV Stopped (11/28/16 1503)  . dextrose 5 % and 0.9% NaCl 75 mL/hr at 11/27/16 2022     LOS: 18 days    Time spent: 15 minutes  Greater than 50% of the time spent on counseling and coordinating the care.   Leisa Lenz, MD [facility] Pager [phone]  If 7PM-7AM, please contact night-coverage [email or web address] Password [facility] 11/28/2016, 4:48 PM

## 2016-11-28 NOTE — Progress Notes (Addendum)
Patient ID: Annette Farmer, female   DOB: Jul 22, 1945, 71 y.o.   MRN: [id number] Pt s/p left IJ port a cath 7/6; afebrile, WBC 3.2, hgb stable; port site with some mild erythema and bruising around pocket as well as slight edema, possible small hematoma; no drainage or tissue breakdown, mildly tender to palpation. Would continue close observation for now and avoid access at this time. Will cont to monitor.

## 2016-11-29 ENCOUNTER — Other Ambulatory Visit: Payer: Self-pay | Admitting: Hematology and Oncology

## 2016-11-29 DIAGNOSIS — C561 Malignant neoplasm of right ovary: Secondary | ICD-10-CM

## 2016-11-29 DIAGNOSIS — K5649 Other impaction of intestine: Secondary | ICD-10-CM

## 2016-11-29 LAB — CBC WITH DIFFERENTIAL/PLATELET
BASOS ABS: 0.1 10*3/uL (ref 0.0–0.1)
Basophils Relative: 1 %
EOS ABS: 0.1 10*3/uL (ref 0.0–0.7)
Eosinophils Relative: 1 %
HCT: 28.8 % — ABNORMAL LOW (ref 36.0–46.0)
HEMOGLOBIN: 9.7 g/dL — AB (ref 12.0–15.0)
LYMPHS PCT: 10 %
Lymphs Abs: 1.4 10*3/uL (ref 0.7–4.0)
MCH: 29.4 pg (ref 26.0–34.0)
MCHC: 33.7 g/dL (ref 30.0–36.0)
MCV: 87.3 fL (ref 78.0–100.0)
Monocytes Absolute: 1.4 10*3/uL — ABNORMAL HIGH (ref 0.1–1.0)
Monocytes Relative: 10 %
NEUTROS ABS: 10.9 10*3/uL — AB (ref 1.7–7.7)
NEUTROS PCT: 78 %
Platelets: 187 10*3/uL (ref 150–400)
RBC: 3.3 MIL/uL — ABNORMAL LOW (ref 3.87–5.11)
RDW: 14.4 % (ref 11.5–15.5)
WBC: 13.9 10*3/uL — ABNORMAL HIGH (ref 4.0–10.5)

## 2016-11-29 LAB — GLUCOSE, CAPILLARY: GLUCOSE-CAPILLARY: 133 mg/dL — AB (ref 65–99)

## 2016-11-29 MED ORDER — PANTOPRAZOLE SODIUM 40 MG PO TBEC: 40.0000 mg | DELAYED_RELEASE_TABLET | Freq: Every day | ORAL | 0 refills | Status: AC

## 2016-11-29 MED ORDER — GUAIFENESIN-DM 100-10 MG/5ML PO SYRP: 5.0000 mL | ORAL_SOLUTION | ORAL | 0 refills | Status: AC | PRN

## 2016-11-29 MED ORDER — DEXTROSE-NACL 5-0.45 % IV SOLN: INTRAVENOUS | Status: DC

## 2016-11-29 MED ORDER — BISACODYL 10 MG RE SUPP: 10.0000 mg | Freq: Every day | RECTAL | 0 refills | Status: AC

## 2016-11-29 MED ORDER — INSULIN ASPART 100 UNIT/ML ~~LOC~~ SOLN: 0.0000 [IU] | Freq: Three times a day (TID) | SUBCUTANEOUS | 11 refills | Status: DC

## 2016-11-29 MED ORDER — ALBUTEROL SULFATE (2.5 MG/3ML) 0.083% IN NEBU
2.5000 mg | INHALATION_SOLUTION | Freq: Four times a day (QID) | RESPIRATORY_TRACT | 12 refills | Status: AC | PRN
Start: 2016-11-29 — End: ?

## 2016-11-29 MED ORDER — ENOXAPARIN SODIUM 100 MG/ML ~~LOC~~ SOLN: 85.0000 mg | Freq: Two times a day (BID) | SUBCUTANEOUS | 0 refills | Status: DC

## 2016-11-29 MED ORDER — CEPHALEXIN 500 MG PO CAPS: 500.0000 mg | ORAL_CAPSULE | Freq: Four times a day (QID) | ORAL | 0 refills | Status: DC

## 2016-11-29 MED ORDER — ENSURE ENLIVE PO LIQD: 237.0000 mL | ORAL | 12 refills | Status: DC

## 2016-11-29 MED ORDER — ALUM & MAG HYDROXIDE-SIMETH 200-200-20 MG/5ML PO SUSP: 30.0000 mL | ORAL | 0 refills | Status: AC | PRN

## 2016-11-29 MED ORDER — ACETAMINOPHEN 325 MG PO TABS: 650.0000 mg | ORAL_TABLET | Freq: Four times a day (QID) | ORAL | 0 refills | Status: AC | PRN

## 2016-11-29 MED ORDER — SENNA 8.6 MG PO TABS: 2.0000 | ORAL_TABLET | Freq: Every day | ORAL | 0 refills | Status: DC

## 2016-11-29 MED ORDER — ONDANSETRON HCL 4 MG PO TABS: 4.0000 mg | ORAL_TABLET | Freq: Every day | ORAL | 1 refills | Status: AC | PRN

## 2016-11-29 MED ORDER — BOOST / RESOURCE BREEZE PO LIQD: 1.0000 | Freq: Two times a day (BID) | ORAL | 0 refills | Status: DC

## 2016-11-29 NOTE — Discharge Summary (Addendum)
Physician Discharge Summary  Annette Farmer ALP:[id number] DOB: Dec 25, 1945 DOA: 11/10/2016  PCP: Wardell Honour, MD  Admit date: 11/10/2016 Discharge date: 11/29/2016  Recommendations for Outpatient Follow-up:  Please continue to monitor port-a-cath site. There is some erythema around it.  Continue Kelfex for 5 days on discharge which will cover for possible skin infection at the [facility] site as well as UTI. Continue Lovenox for DVT. Monitor for any signs of bleeding and hold if there is bleeding. Also, check CBC per SNF protocol to make sure hgb is stable.  Please continue palliative car services in SNF.  Discharge Diagnoses:  Principal Problem:   Ovarian cancer on right [facility]): Stage 3C Active Problems:   Type 2 diabetes mellitus (HCC)   HYPERTENSION, BENIGN SYSTEMIC   DVT (deep venous thrombosis) (HCC)   AKI (acute kidney injury) ([place])   Pelvic mass   Carcinomatosis ([place])   Metastatic cancer (HCC)   Palliative care encounter   Goals of care, counseling/discussion   Bowel obstruction (HCC)   Severe protein-calorie malnutrition (HCC)   Ileus (HCC)   Acute thromboembolism of deep veins of right lower extremity (HCC)   Port-a-cath in place   DNR (do not resuscitate)   Peritoneal carcinomatosis ([place])    Discharge Condition: stable   Diet recommendation: as tolerated   History of present illness:   71 year old female with hypertension, chronic right sided weakness from history of CVA. Patient presented to office 11/05/16 with RLE edema and right hip pain after a fall. Pt was subsequently found to have RLE DVT. Pt also noted to have extensive nodular implants consistent with carcinomatosis on CT scan and she also had ascites. Pt underwent paracentesis, cytology consistent with adenocarcinoma. Hospital course was complicated with ileus and gen surgery also consulted. Pt had NG tube from 7/1 - 7/2. During this hospital stay pt underwent neoadjuvant chemo which was given 6/28 through  peripheral line.  Plan for [facility] placement today.71 year old female with hypertension, chronic right sided weakness from history of CVA. Patient presented to office 11/05/16 with RLE edema and right hip pain after a fall. Pt was subsequently found to have RLE DVT. Pt also noted to have extensive nodular implants consistent with carcinomatosis on CT scan and she also had ascites. Pt underwent paracentesis, cytology consistent with adenocarcinoma. Hospital course was complicated with ileus and gen surgery also consulted. Pt had NG tube from 7/1 - 7/2. During this hospital stay pt underwent neoadjuvant chemo which was given 6/28 through peripheral line.  PAC placed 7/6 by IR.  Hospital Course:   Assessment and plan:   Principal Problem: Ovarian cancer on right [facility]): Stage 3C / Peritoneal carcinomatosis  - Cytology consistent with adenocarcinoma - Neoadjuvant chemo started 6/28 - PAC placed 7/6; surrounding erythema noted since 7/8, seems stable, would not access until erythema and swelling around it resolves and she will be on Keflex on discharge for 5 days. - Follow up with oncology per scheduled appt    Active Problems: Bowel obstruction ([place]) - In the setting of peritoneal carcinomatosis - X ray 6/28 - no obstruction  - NG tube pulled out 7/2 - Pt decline TPN for nutritional support  E.Coli UTI - Stop cefazolin and use Keflex for 5 days  Acute gross hematuria / Anemia of chronic disease - Due to tumor invasion into bladder - No intervention per GU - Hgb stable   Acute thromboembolism of deep veins of right lower extremity (HCC) - RLE doppler positive for  DVT  - No DVT in LLE - No thrombectomy required per vascular surgery  - Continue Lovenox   AKI - Due ot volume depletion, prerenal azotemia - Cr WNL  Hyponatremia - Due to GI losses, poor po intake  - Sodium stable  Essential hypertension - Continue Toprol   Severe protein calorie malnutrition - Due to  ovarian ca - On full liquid diet    DVT prophylaxis:Lovenox subQ Code Status:DNR/DNI Family Communication:no family at the bedside this am    Consultants:  Palliative care   Oncology, Dr. Alvy Bimler 11/15/2016  IR, Dr. Pascal Lux 11/14/2016  General surgery, Dr. Donne Hazel 11/14/2016  GYN oncology, Dr. Denman George 11/15/2016 and Dr. Delsa Sale 11/11/2016  Vascular surgery, Dr. Donzetta Matters 11/12/2016  Urologym, Dr. Jeffie Pollock 11/18/2016  PT  Nutrition  Procedures:  LE doppler 6/21 - widespread DVT in RLE; LLE doppler 6/30 - no DVT  Paracentesis 6/22 - 420 mg fluid removed   NG tube 7/1 - 7/2   Antimicrobials:   Rocephin 7/6 --> 7/7  Ancef 7/7 --> 7/10    Signed:  Leisa Lenz, MD  [facility] 11/29/2016, 9:35 AM  Pager #: [phone]  Time spent in minutes: more than 30 minutes  Discharge Exam: Vitals:   11/28/16 2208 11/29/16 0625  BP: 95/64 105/62  Pulse: 86 97  Resp: 17 19  Temp: 98.4 F (36.9 C) 97.8 F (36.6 C)   Vitals:   11/28/16 1420 11/28/16 1628 11/28/16 2208 11/29/16 0625  BP: (!) 92/57 92/74 95/64  105/62  Pulse: 87 90 86 97  Resp:  19 17 19   Temp:  98.1 F (36.7 C) 98.4 F (36.9 C) 97.8 F (36.6 C)  TempSrc:  Oral Oral Oral  SpO2:  97% 91% 97%  Weight:    99.5 kg (219 lb 5.7 oz)  Height:        General: Pt is alert, follows commands appropriately, not in acute distress Cardiovascular: Regular rate and rhythm, S1/S2 +, no murmurs Respiratory: Clear to auscultation bilaterally, no wheezing, no crackles, no rhonchi Abdominal: Soft, non tender, non distended, bowel sounds +, no guarding Extremities: UE edema appreciated, palpable pulses  Neuro: Grossly nonfocal  Discharge Instructions  Discharge Instructions    Call MD for:  persistant nausea and vomiting    Complete by:  As directed    Call MD for:  redness, tenderness, or signs of infection (pain, swelling, redness, odor or green/yellow discharge around incision site)     Complete by:  As directed    Call MD for:  severe uncontrolled pain    Complete by:  As directed    Diet - low sodium heart healthy    Complete by:  As directed    Discharge instructions    Complete by:  As directed    Please continue to monitor port-a-cath site. There is some erythema around it.  Continue Kelfex for 5 days on discharge which will cover for possible skin infection at the [facility] site as well as UTI. Continue Lovenox for DVT. Monitor for any signs of bleeding and hold if there is bleeding. Also, check CBC per SNF protocol to make sure hgb is stable.   Increase activity slowly    Complete by:  As directed    SCHEDULING COMMUNICATION    Complete by:  As directed    Chemotherapy Appointment - 4.5 hr   TREATMENT CONDITIONS    Complete by:  As directed    Patient should have CBC & CMP within 7 days prior  to chemotherapy administration. NOTIFY MD IF: ANC < 1500, Hemoglobin < 8, PLT < 100,000,  Total Bili > 1.5, Creatinine > 1.5, ALT & AST > 80 or if patient has unstable vital signs: Temperature > 38.5, SBP > 180 or < 90, RR > 30 or HR > 100.     Allergies as of 11/29/2016      Reactions   Penicillins Nausea Only   Has patient had a PCN reaction causing immediate rash, facial/tongue/throat swelling, SOB or lightheadedness with hypotension: NO Has patient had a PCN reaction causing severe rash involving mucus membranes or skin necrosis: NO Has patient had a PCN reaction that required hospitalization: NO Has patient had a PCN reaction occurring within the last 10 years: NO If all of the above answers are "NO", then may proceed with Cephalosporin use.      Medication List    STOP taking these medications   aspirin 325 MG tablet   ibuprofen 800 MG tablet Commonly known as:  ADVIL,MOTRIN   KRILL OIL PO   losartan-hydrochlorothiazide 50-12.5 MG tablet Commonly known as:  HYZAAR   metFORMIN 500 MG tablet Commonly known as:  GLUCOPHAGE     TAKE these medications    acetaminophen 325 MG tablet Commonly known as:  TYLENOL Take 2 tablets (650 mg total) by mouth every 6 (six) hours as needed for mild pain (or Fever >/= 101).   albuterol (2.5 MG/3ML) 0.083% nebulizer solution Commonly known as:  PROVENTIL Take 3 mLs (2.5 mg total) by nebulization every 6 (six) hours as needed for wheezing or shortness of breath.   alum & mag hydroxide-simeth 200-200-20 MG/5ML suspension Commonly known as:  MAALOX/MYLANTA Take 30 mLs by mouth every 4 (four) hours as needed for indigestion or heartburn.   bisacodyl 10 MG suppository Commonly known as:  DULCOLAX Place 1 suppository (10 mg total) rectally daily.   cephALEXin 500 MG capsule Commonly known as:  KEFLEX Take 1 capsule (500 mg total) by mouth 4 (four) times daily.   COLLAGEN PO Take 6 tablets by mouth daily.   enoxaparin 100 MG/ML injection Commonly known as:  LOVENOX Inject 0.85 mLs (85 mg total) into the skin every 12 (twelve) hours.   feeding supplement Liqd Take 1 Container by mouth 2 (two) times daily.   feeding supplement (ENSURE ENLIVE) Liqd Take 237 mLs by mouth daily.   gabapentin 300 MG capsule Commonly known as:  NEURONTIN Take 1 capsule (300 mg total) by mouth at bedtime.   guaiFENesin-dextromethorphan 100-10 MG/5ML syrup Commonly known as:  ROBITUSSIN DM Take 5 mLs by mouth every 4 (four) hours as needed for cough.   insulin aspart 100 UNIT/ML injection Commonly known as:  novoLOG Inject 0-9 Units into the skin 3 (three) times daily with meals.   metoprolol succinate 25 MG 24 hr tablet Commonly known as:  TOPROL-XL Take 1 tablet (25 mg total) by mouth daily.   nystatin powder Commonly known as:  MYCOSTATIN/NYSTOP Apply topically 3 (three) times daily. TO RASH OF R GROIN   ondansetron 4 MG tablet Commonly known as:  ZOFRAN Take 1 tablet (4 mg total) by mouth daily as needed for nausea or vomiting.   pantoprazole 40 MG tablet Commonly known as:  PROTONIX Take 1 tablet  (40 mg total) by mouth daily at 6 (six) AM. Start taking on:  11/30/2016   senna 8.6 MG Tabs tablet Commonly known as:  SENOKOT Take 2 tablets (17.2 mg total) by mouth daily.   traMADol 50 MG  tablet Commonly known as:  ULTRAM Take 1 tablet (50 mg total) by mouth every 6 (six) hours as needed for moderate pain.        Contact information for follow-up providers    Wardell Honour, MD. Schedule an appointment as soon as possible for a visit in 1 week(s).   Specialty:  Family Medicine Contact information: [place] [address] [phone]            Contact information for after-discharge care    Destination    [facility] Follow up.   Specialty:  [place] information: [address] [place] [phone]                   The results of significant diagnostics from this hospitalization (including imaging, microbiology, ancillary and laboratory) are listed below for reference.    Significant Diagnostic Studies: Dg Chest 2 View  Result Date: 11/10/2016 CLINICAL DATA:  Chest pain and right lower extremity DVT EXAM: CHEST  2 VIEW COMPARISON:  Chest radiograph 08/30/2015 FINDINGS: Shallow lung inflation without focal airspace disease or pulmonary edema. No pleural effusion or pneumothorax. Unchanged cardiomediastinal contours. IMPRESSION: No active cardiopulmonary disease. Electronically Signed   By: Ulyses Jarred M.D.   On: 11/10/2016 18:25   Dg Tibia/fibula Right  Result Date: 11/05/2016 CLINICAL DATA:  Swelling and pain in the right lower extremity after fall 1 week prior EXAM: RIGHT TIBIA AND FIBULA - 2 VIEW COMPARISON:  None. FINDINGS: Diffuse right lower extremity soft tissue swelling, most prominent lateral to the right knee. No fracture. No suspicious focal osseous lesion. No evidence of malalignment at the right knee or right ankle on these views. No radiopaque foreign body. IMPRESSION:  Diffuse right lower extremity soft tissue swelling, most prominent lateral to the right knee. No fracture. No radiopaque foreign body. Electronically Signed   By: Ilona Sorrel M.D.   On: 11/05/2016 17:38   Dg Abd 1 View  Result Date: 11/18/2016 CLINICAL DATA:  Followup ileus or partial small bowel obstruction. Ovarian cancer. EXAM: ABDOMEN - 1 VIEW COMPARISON:  11/15/2016. FINDINGS: Normal bowel gas pattern.  Unremarkable bones. IMPRESSION: No acute abnormality. Electronically Signed   By: Claudie Revering M.D.   On: 11/18/2016 15:02   Dg Abd 1 View  Result Date: 11/15/2016 CLINICAL DATA:  Abdominal distention, back pain, history of SP of EXAM: ABDOMEN - 1 VIEW COMPARISON:  Abdomen film of 11/14/2016 and CT abdomen pelvis of 11/10/2016 FINDINGS: Compared to the most recent KUB, there is less gaseous distention of large and small bowel. There is some gaseous distention of the stomach. No small bowel obstruction is evident. A moderate amount of feces is noted in the rectosigmoid colon. No opaque calculi are seen. The bones are unremarkable. IMPRESSION: 1. No present evidence of small-bowel obstruction. Very little gaseous distention of large or small bowel. 2. No opaque calculi . Electronically Signed   By: Ivar Drape M.D.   On: 11/15/2016 08:45   Ct Angio Chest Pe W And/or Wo Contrast  Result Date: 11/10/2016 CLINICAL DATA:  Shortness of breath, abdominal pain, right lower extremity DVT EXAM: CT ANGIOGRAPHY CHEST CT ABDOMEN AND PELVIS WITH CONTRAST TECHNIQUE: Multidetector CT imaging of the chest was performed using the standard protocol during bolus administration of intravenous contrast. Multiplanar CT image reconstructions and MIPs were obtained to evaluate the vascular anatomy. Multidetector CT imaging of the abdomen and pelvis was performed using the standard protocol during bolus  administration of intravenous contrast. CONTRAST:  100 mL Isovue 370 IV COMPARISON:  Chest radiograph 12/24/2003 and  11/10/2016 FINDINGS: CTA CHEST FINDINGS Cardiovascular: Contrast injection is sufficient to demonstrate satisfactory opacification of the pulmonary arteries to the segmental level. There is no pulmonary embolus. The main pulmonary artery is within normal limits for size. There is no CT evidence of acute right heart strain. There is calcific aortic atherosclerosis. There are multifocal coronary artery calcifications. There is a normal 3-vessel arch branching pattern. Heart size is normal, without pericardial effusion. Mediastinum/Nodes: No mediastinal, hilar or axillary lymphadenopathy. The visualized thyroid and thoracic esophageal course are unremarkable. Lungs/Pleura: No pulmonary nodules or masses. No pleural effusion or pneumothorax. No focal airspace consolidation. No focal pleural abnormality. Musculoskeletal: No chest wall abnormality. No acute or significant osseous findings. Review of the MIP images confirms the above findings. CT ABDOMEN and PELVIS FINDINGS Hepatobiliary: Moderate volume perihepatic ascites. No focal liver lesion. The gallbladder is filled with stones. Pancreas: Normal pancreatic contours and enhancement. No peripancreatic fluid collection or pancreatic ductal dilatation. Spleen: Medium volume perisplenic ascites. There is peritoneal nodularity particularly in the left upper quadrant and lower midline abdomen. Adrenals/Urinary Tract: Normal adrenal glands. No hydronephrosis or solid renal mass. Stomach/Bowel: There is no hiatal hernia. The stomach and duodenum are normal. There is no dilated small bowel or enteric inflammation. There is no colonic abnormality. Nonvisualization of the appendix. Vascular/Lymphatic: There is atherosclerotic calcification of the non aneurysmal abdominal aorta. Aortocaval lymph nodes measure up to 8 mm. Reproductive: There is a multilobulated mass of the right pelvis. It is unclear whether it arises from the uterine fundus to the right adnexa. There is a left  ovarian cyst measuring 4 cm. Musculoskeletal: No lytic or blastic osseous lesion. Normal visualized extrathoracic and extraperitoneal soft tissues. Other: No contributory non-categorized findings. IMPRESSION: 1. No pulmonary embolus. 2. Extensive anterior peritoneal nodular implants, consistent with carcinomatosis. Perihepatic and perisplenic ascites is likely malignant. 3. Lobulated right pelvic mass arising from either the right adnexa or the uterine fundus. This may be the primary malignancy. 4. Gallbladder filled stones but no other evidence of acute cholecystitis. 5. Multiple subcentimeter retroperitoneal nodes. In this context, these could indicate lymphatic spread of disease. Electronically Signed   By: Ulyses Jarred M.D.   On: 11/10/2016 19:29   Ct Abdomen Pelvis W Contrast  Result Date: 11/10/2016 CLINICAL DATA:  Shortness of breath, abdominal pain, right lower extremity DVT EXAM: CT ANGIOGRAPHY CHEST CT ABDOMEN AND PELVIS WITH CONTRAST TECHNIQUE: Multidetector CT imaging of the chest was performed using the standard protocol during bolus administration of intravenous contrast. Multiplanar CT image reconstructions and MIPs were obtained to evaluate the vascular anatomy. Multidetector CT imaging of the abdomen and pelvis was performed using the standard protocol during bolus administration of intravenous contrast. CONTRAST:  100 mL Isovue 370 IV COMPARISON:  Chest radiograph 12/24/2003 and 11/10/2016 FINDINGS: CTA CHEST FINDINGS Cardiovascular: Contrast injection is sufficient to demonstrate satisfactory opacification of the pulmonary arteries to the segmental level. There is no pulmonary embolus. The main pulmonary artery is within normal limits for size. There is no CT evidence of acute right heart strain. There is calcific aortic atherosclerosis. There are multifocal coronary artery calcifications. There is a normal 3-vessel arch branching pattern. Heart size is normal, without pericardial effusion.  Mediastinum/Nodes: No mediastinal, hilar or axillary lymphadenopathy. The visualized thyroid and thoracic esophageal course are unremarkable. Lungs/Pleura: No pulmonary nodules or masses. No pleural effusion or pneumothorax. No focal airspace consolidation. No focal pleural  abnormality. Musculoskeletal: No chest wall abnormality. No acute or significant osseous findings. Review of the MIP images confirms the above findings. CT ABDOMEN and PELVIS FINDINGS Hepatobiliary: Moderate volume perihepatic ascites. No focal liver lesion. The gallbladder is filled with stones. Pancreas: Normal pancreatic contours and enhancement. No peripancreatic fluid collection or pancreatic ductal dilatation. Spleen: Medium volume perisplenic ascites. There is peritoneal nodularity particularly in the left upper quadrant and lower midline abdomen. Adrenals/Urinary Tract: Normal adrenal glands. No hydronephrosis or solid renal mass. Stomach/Bowel: There is no hiatal hernia. The stomach and duodenum are normal. There is no dilated small bowel or enteric inflammation. There is no colonic abnormality. Nonvisualization of the appendix. Vascular/Lymphatic: There is atherosclerotic calcification of the non aneurysmal abdominal aorta. Aortocaval lymph nodes measure up to 8 mm. Reproductive: There is a multilobulated mass of the right pelvis. It is unclear whether it arises from the uterine fundus to the right adnexa. There is a left ovarian cyst measuring 4 cm. Musculoskeletal: No lytic or blastic osseous lesion. Normal visualized extrathoracic and extraperitoneal soft tissues. Other: No contributory non-categorized findings. IMPRESSION: 1. No pulmonary embolus. 2. Extensive anterior peritoneal nodular implants, consistent with carcinomatosis. Perihepatic and perisplenic ascites is likely malignant. 3. Lobulated right pelvic mass arising from either the right adnexa or the uterine fundus. This may be the primary malignancy. 4. Gallbladder filled  stones but no other evidence of acute cholecystitis. 5. Multiple subcentimeter retroperitoneal nodes. In this context, these could indicate lymphatic spread of disease. Electronically Signed   By: Ulyses Jarred M.D.   On: 11/10/2016 19:29   US Abdomen Limited  Result Date: 11/20/2016 CLINICAL DATA:  Gyn malignancy with carcinomatosis. Abdominal distention and malignant small-bowel obstruction. Request is made for evaluations verbal paracentesis. EXAM: LIMITED ABDOMEN ULTRASOUND FOR ASCITES TECHNIQUE: Limited ultrasound survey for ascites was performed in all four abdominal quadrants. COMPARISON:  None. FINDINGS: All 4 quadrants evaluated and minimal ascites is noted. IMPRESSION: Not enough fluid for paracentesis. Read by: Saverio Danker, PA-C Electronically Signed   By: Markus Daft M.D.   On: 11/20/2016 13:05   Ir US Guide Vasc Access Left  Result Date: 11/25/2016 INDICATION: 71 year old with peritoneal carcinomatosis. Port-A-Cath needed for treatment. The patient's white blood cell count is markedly low and abnormal. This was discussed with the patient's oncologist, Dr. Alvy Bimler, who felt the patient was safe for Port-A-Cath placement. EXAM: FLUOROSCOPIC AND ULTRASOUND GUIDED PLACEMENT OF A SUBCUTANEOUS PORT. Physician: Stephan Minister. Anselm Pancoast, MD MEDICATIONS: Cleocin 600 mg administered intravenously within one hour of incision. ANESTHESIA/SEDATION: Versed 1.0 mg IV; Fentanyl 25 mcg IV; Moderate Sedation Time:  40 minutes The patient was continuously monitored during the procedure by the interventional radiology nurse under my direct supervision. FLUOROSCOPY TIME:  54 seconds, 7 mGy COMPLICATIONS: None immediate. PROCEDURE: The risks of the procedure were explained to the patient. Informed consent was obtained. Patient was placed supine on the interventional table. Ultrasound confirmed a patent left internal jugular vein. The left chest and neck were cleaned with a skin antiseptic and a sterile drape was placed. Maximal  barrier sterile technique was utilized including caps, mask, sterile gowns, sterile gloves, sterile drape, hand hygiene and skin antiseptic. The left neck was anesthetized with 1% lidocaine. Small incision was made in the left neck with a blade. Micropuncture set was placed in the left IJ with ultrasound guidance. The left chest was anesthetized with 1% lidocaine with epinephrine. #15 blade was used to make an incision and a subcutaneous port pocket was formed. 8 french Power  Port was selected. Subcutaneous tunnel was formed with a stiff tunneling device. The port catheter was brought through the subcutaneous tunnel. The micropuncture set was exchanged for a peel-away sheath. The catheter was placed through the peel-away sheath and the tip was positioned at the superior cavoatrial junction. Catheter placement was confirmed with fluoroscopy. The port was assembled and placed within the subcutaneous pocket. The port was accessed and flushed with heparinized saline. The port pocket was closed using two layers of absorbable sutures and Dermabond. The vein skin site was closed using a single layer of absorbable suture and Dermabond. Sterile dressings were applied. Patient tolerated the procedure well without an immediate complication. Ultrasound and fluoroscopic images were taken and saved for this procedure. IMPRESSION: Placement of a subcutaneous port device. Electronically Signed   By: Markus Daft M.D.   On: 11/25/2016 15:53   Dg Abd Portable 1v-small Bowel Obstruction Protocol-initial, 8 Hr Delay  Result Date: 11/20/2016 CLINICAL DATA:  71 year old female 8 hours status post enteric contrast via NG tube. EXAM: PORTABLE ABDOMEN - 1 VIEW COMPARISON:  1130 hours today, and earlier. FINDINGS: Portable AP supine view at 2123 hours. Stable enteric tube, side hole up the level of the gastric fundus. Oral contrast present throughout the right colon to the mid transverse colon. No dilated small or large bowel loops. Some  retained stool in the colon. No acute osseous abnormality identified. IMPRESSION: 1. Enteric contrast has reached the mid transverse colon. No dilated small or large bowel loops. 2. Stable enteric tube. Electronically Signed   By: Genevie Ann M.D.   On: 11/20/2016 21:47   Dg Abd Portable 1v-small Bowel Protocol-position Verification  Result Date: 11/20/2016 CLINICAL DATA:  NG tube placement EXAM: PORTABLE ABDOMEN - 1 VIEW COMPARISON:  11/18/2016 FINDINGS: NG tube is in the stomach. Nonobstructive bowel gas pattern. No free air organomegaly. IMPRESSION: NG tube tip in the stomach. Electronically Signed   By: Rolm Baptise M.D.   On: 11/20/2016 11:47   Dg Abd Portable 1v  Result Date: 11/14/2016 CLINICAL DATA:  Small bowel obstruction, intra-abdominal ascites an malignancy. EXAM: PORTABLE ABDOMEN - 1 VIEW COMPARISON:  Abdominal and pelvic CT scan of November 10, 2016 FINDINGS: There are loops of mildly distended gas-filled small bowel present. There is stool and gas within normal caliber colon and rectum. No free extraluminal gas collections are observed. IMPRESSION: The bowel gas pattern is consistent with small bowel ileus or partial mid to distal small bowel obstruction. Electronically Signed   By: David  Martinique M.D.   On: 11/14/2016 11:43   Dg Hip Unilat W Or W/o Pelvis 2-3 Views Right  Result Date: 11/03/2016 CLINICAL DATA:  Patient fell 6 days ago. Swollen right leg with hip pain. EXAM: DG HIP (WITH OR WITHOUT PELVIS) 2-3V RIGHT COMPARISON:  Abdomen films from 12/24/2003. FINDINGS: There is no evidence of hip fracture or dislocation. There is no evidence of arthropathy or other focal bone abnormality. IMPRESSION: Negative. Electronically Signed   By: Misty Stanley M.D.   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 Port-A-Cath needed for treatment. The patient's white blood cell count is markedly low and abnormal.  This was discussed with the patient's oncologist, Dr. Alvy Bimler, who felt the patient was safe for Port-A-Cath placement. EXAM: FLUOROSCOPIC AND ULTRASOUND GUIDED PLACEMENT OF A SUBCUTANEOUS PORT. Physician: Stephan Minister. Anselm Pancoast, MD MEDICATIONS: Cleocin 600 mg administered intravenously within one hour of incision. ANESTHESIA/SEDATION: Versed 1.0  mg IV; Fentanyl 25 mcg IV; Moderate Sedation Time:  40 minutes The patient was continuously monitored during the procedure by the interventional radiology nurse under my direct supervision. FLUOROSCOPY TIME:  54 seconds, 7 mGy COMPLICATIONS: None immediate. PROCEDURE: The risks of the procedure were explained to the patient. Informed consent was obtained. Patient was placed supine on the interventional table. Ultrasound confirmed a patent left internal jugular vein. The left chest and neck were cleaned with a skin antiseptic and a sterile drape was placed. Maximal barrier sterile technique was utilized including caps, mask, sterile gowns, sterile gloves, sterile drape, hand hygiene and skin antiseptic. The left neck was anesthetized with 1% lidocaine. Small incision was made in the left neck with a blade. Micropuncture set was placed in the left IJ with ultrasound guidance. The left chest was anesthetized with 1% lidocaine with epinephrine. #15 blade was used to make an incision and a subcutaneous port pocket was formed. [place] was selected. Subcutaneous tunnel was formed with a stiff tunneling device. The port catheter was brought through the subcutaneous tunnel. The micropuncture set was exchanged for a peel-away sheath. The catheter was placed through the peel-away sheath and the tip was positioned at the superior cavoatrial junction. Catheter placement was confirmed with fluoroscopy. The port was assembled and placed within the subcutaneous pocket. The port was accessed and flushed with heparinized saline. The port pocket was closed using two layers of absorbable  sutures and Dermabond. The vein skin site was closed using a single layer of absorbable suture and Dermabond. Sterile dressings were applied. Patient tolerated the procedure well without an immediate complication. Ultrasound and fluoroscopic images were taken and saved for this procedure. IMPRESSION: Placement of a subcutaneous port device. Electronically Signed   By: Markus Daft M.D.   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 MEDICATIONS: 10 cc 1% lidocaine. COMPLICATIONS: None immediate. TECHNIQUE: Informed written consent was obtained from the patient after a discussion of the risks, benefits and alternatives to treatment. A timeout was performed prior to the initiation of the procedure. Initial ultrasound scanning demonstrates a small amount of ascites within the right upper abdominal quadrant. The right upper abdomen was prepped and draped in the usual sterile fashion. 1% lidocaine with epinephrine was used for local anesthesia. Under direct ultrasound guidance, a 19 gauge, 7-cm, Yueh catheter was introduced. An ultrasound image was saved for documentation purposed. The paracentesis was performed. The catheter was removed and a dressing was applied. The patient tolerated the procedure well without immediate post procedural complication. FINDINGS: A total of approximately 420 cc of dark yellow fluid was removed. Samples were sent to the laboratory as requested by the clinical team. IMPRESSION: Successful ultrasound-guided paracentesis yielding 420 cc of peritoneal fluid. Read by Lavonia Drafts [facility] Electronically Signed   By: Jacqulynn Cadet M.D.   On: 11/11/2016 15:52    Microbiology: No results found for this or any previous visit (from the past 240 hour(s)).   Labs: Basic Metabolic Panel:  Recent Labs Lab 11/24/16 0328 11/24/16 1204 11/26/16 0445 11/27/16 0615  NA 131*  130* 128* 130*  K 4.4 4.3 3.9 3.7  CL 99* 100* 98* 99*  CO2 22 27 26 24   GLUCOSE 166* 184* 141* 116*  BUN 11 11 13 13   CREATININE 0.69 0.79 0.83 0.90  CALCIUM 8.2* 7.4* 7.2* 7.3*   Liver Function Tests: No results for input(s): AST,  ALT, ALKPHOS, BILITOT, PROT, ALBUMIN in the last 168 hours. No results for input(s): LIPASE, AMYLASE in the last 168 hours. No results for input(s): AMMONIA in the last 168 hours. CBC:  Recent Labs Lab 11/25/16 0349 11/26/16 0445 11/27/16 0615 11/28/16 0352 11/29/16 0813  WBC 0.8* 0.7* 1.2* 3.2* 13.9*  NEUTROABS 0.3* 0.2* 0.3* 1.5* 10.9*  HGB 9.9* 8.9* 8.8* 9.5* 9.7*  HCT 29.2* 25.7* 26.1* 28.2* 28.8*  MCV 87.7 87.4 86.4 86.0 87.3  PLT 145* 157 149* 168 187   Cardiac Enzymes: No results for input(s): CKTOTAL, CKMB, CKMBINDEX, TROPONINI in the last 168 hours. BNP: BNP (last 3 results) No results for input(s): BNP in the last 8760 hours.  ProBNP (last 3 results) No results for input(s): PROBNP in the last 8760 hours.  CBG:  Recent Labs Lab 11/27/16 2132 11/28/16 0752 11/28/16 1221 11/28/16 2201 11/29/16 0742  GLUCAP 138* 107* 118* 156* 133*

## 2016-11-29 NOTE — Discharge Instructions (Signed)
Cephalexin tablets or capsules What is this medicine? CEPHALEXIN (sef a LEX in) is a cephalosporin antibiotic. It is used to treat certain kinds of bacterial infections It will not work for colds, flu, or other viral infections. This medicine may be used for other purposes; ask your health care provider or pharmacist if you have questions. COMMON BRAND NAME(S): Biocef, Daxbia, Keflex, Keftab What should I tell my health care provider before I take this medicine? They need to know if you have any of these conditions: -kidney disease -stomach or intestine problems, especially colitis -an unusual or allergic reaction to cephalexin, other cephalosporins, penicillins, other antibiotics, medicines, foods, dyes or preservatives -pregnant or trying to get pregnant -breast-feeding How should I use this medicine? Take this medicine by mouth with a full glass of water. Follow the directions on the prescription label. This medicine can be taken with or without food. Take your medicine at regular intervals. Do not take your medicine more often than directed. Take all of your medicine as directed even if you think you are better. Do not skip doses or stop your medicine early. Talk to your pediatrician regarding the use of this medicine in children. While this drug may be prescribed for selected conditions, precautions do apply. Overdosage: If you think you have taken too much of this medicine contact a poison control center or emergency room at once. NOTE: This medicine is only for you. Do not share this medicine with others. What if I miss a dose? If you miss a dose, take it as soon as you can. If it is almost time for your next dose, take only that dose. Do not take double or extra doses. There should be at least 4 to 6 hours between doses. What may interact with this medicine? -probenecid -some other antibiotics This list may not describe all possible interactions. Give your health care provider a list of  all the medicines, herbs, non-prescription drugs, or dietary supplements you use. Also tell them if you smoke, drink alcohol, or use illegal drugs. Some items may interact with your medicine. What should I watch for while using this medicine? Tell your doctor or health care professional if your symptoms do not begin to improve in a few days. Do not treat diarrhea with over the counter products. Contact your doctor if you have diarrhea that lasts more than 2 days or if it is severe and watery. If you have diabetes, you may get a false-positive result for sugar in your urine. Check with your doctor or health care professional. What side effects may I notice from receiving this medicine? Side effects that you should report to your doctor or health care professional as soon as possible: -allergic reactions like skin rash, itching or hives, swelling of the face, lips, or tongue -breathing problems -pain or trouble passing urine -redness, blistering, peeling or loosening of the skin, including inside the mouth -severe or watery diarrhea -unusually weak or tired -yellowing of the eyes, skin Side effects that usually do not require medical attention (report to your doctor or health care professional if they continue or are bothersome): -gas or heartburn -genital or anal irritation -headache -joint or muscle pain -nausea, vomiting This list may not describe all possible side effects. Call your doctor for medical advice about side effects. You may report side effects to FDA at 1-800-FDA-1088. Where should I keep my medicine? Keep out of the reach of children. Store at room temperature between 59 and 86 degrees F (15 and   30 degrees C). Throw away any unused medicine after the expiration date. NOTE: This sheet is a summary. It may not cover all possible information. If you have questions about this medicine, talk to your doctor, pharmacist, or health care provider.  2018 Elsevier/Gold Standard  (2007-08-13 17:09:13)  

## 2016-11-29 NOTE — Progress Notes (Signed)
ANTICOAGULATION CONSULT NOTE - Follow Up Consult  Pharmacy Consult for Lovenox Indication: DVT  Patient Measurements: Height: 5' 1.5" (156.2 cm) Weight: 219 lb 5.7 oz (99.5 kg) IBW/kg (Calculated) : 48.95   Vital Signs: Temp: 97.8 F (36.6 C) (07/10 0625) Temp Source: Oral (07/10 0625) BP: 105/62 (07/10 0625) Pulse Rate: 97 (07/10 0625)  Labs:  Recent Labs  11/27/16 0615 11/28/16 0352 11/29/16 0813  HGB 8.8* 9.5* 9.7*  HCT 26.1* 28.2* 28.8*  PLT 149* 168 187  CREATININE 0.90  --   --     Estimated Creatinine Clearance: 61.7 mL/min (by C-G formula based on SCr of 0.9 mg/dL).  Assessment: 29 yoF with ovarian cancer, malignant SBO who was initially started on IV heparin for extensive DVTs, then transitioned to Lovenox per Pharmacy.  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  7/5 Lovenox held prior to [facility] placed by IR at 1530 on 7/6 7/7 Pharmacy consulted to resume enoxaparin dosing 7/9 IR notes possible small hematoma around port site  Today, 11/29/2016  Renal function remains stable and WNL  Hgb remains low/stable at 9.7 and pltc WNL  Anemia and neutropenia noted s/p chemo  Noted increasing weight,7/6 onc note states anasarca, 7/9 note states carcinamatosis w/ ascites.  RN notes continued edema.  Dose LMWH using admission weight of 85.5 kg.  Goal of Therapy:  Anti-Xa level 0.6-1 units/ml 4hrs after LMWH dose given Monitor platelets by anticoagulation protocol: Yes   Plan:   Continue Lovenox  85mg  (1mg /kg) SQ q12h  Follow CBC at least q72h.  Monitor renal function and adjust interval as needed  Monitor closely for s/sx bleeding  Gretta Arab PharmD, BCPS Pager [phone] 11/29/2016 9:32 AM

## 2016-11-29 NOTE — Clinical Social Work Placement (Signed)
Pt discharging to transfer to [facility] SN[facility] today. Report # for RN [phone], room #201 Obtained [facility] authorization [id number] [place] [facility], next review date 7/12. Provided facility this information. Pt will transfer via [place] completed medical necessity form and will arrange transportation. Family notified- Pt declined to have CSW call family, states "she is keeping her brother and her friends up to date." All information provided to facility via the [place].   See below for placement details  CLINICAL SOCIAL WORK PLACEMENT  NOTE  Date:  11/29/2016  Patient Details  Name: Annette Farmer MRN: [id number] Date of Birth: 1945-12-08  Clinical Social Work is seeking post-discharge placement for this patient at the [place] level of care (*CSW will initial, date and re-position this form in  chart as items are completed):  Yes   Patient/family provided with [place] Work Department's list of facilities offering this level of care within the geographic area requested by the patient (or if unable, by the patient's family).  Yes   Patient/family informed of their freedom to choose among providers that offer the needed level of care, that participate in Medicare, Medicaid or managed care program needed by the patient, have an available bed and are willing to accept the patient.  Yes   Patient/family informed of [place]'s ownership interest in [facility] and [facility], as well as of the fact that they are under no obligation to receive care at these facilities.  PASRR submitted to EDS on       PASRR number received on       Existing PASRR number confirmed on 11/17/16     FL2 transmitted to all facilities in geographic area requested by pt/family on 11/28/16     FL2 transmitted to all facilities within larger geographic area on       Patient informed that his/her managed care company has contracts with or will negotiate with  certain facilities, including the following:        Yes   Patient/family informed of bed offers received.  Patient chooses bed at [facility]     Physician recommends and patient chooses bed at [facility]    Patient to be transferred to [facility] on 11/29/16.  Patient to be transferred to facility by [facility]     Patient family notified on 11/29/16 of transfer.  Name of family member notified:  Pt informed brother     PHYSICIAN       Additional Comment:    _______________________________________________ Nila Nephew, LCSW 11/29/2016, 9:40 AM

## 2016-11-30 ENCOUNTER — Ambulatory Visit: Payer: Medicare PPO | Admitting: Family Medicine

## 2016-11-30 ENCOUNTER — Telehealth: Payer: Self-pay | Admitting: Hematology and Oncology

## 2016-11-30 NOTE — Telephone Encounter (Signed)
sw pt to confirm 7/18 and 7/20 appts per sch msg

## 2016-12-02 ENCOUNTER — Inpatient Hospital Stay (HOSPITAL_COMMUNITY)
Admission: EM | Admit: 2016-12-02 | Discharge: 2016-12-07 | DRG: 371 | Disposition: A | Discharge status: Skilled Nursing Facility | Payer: Medicare PPO | Attending: Internal Medicine | Admitting: Internal Medicine

## 2016-12-02 ENCOUNTER — Encounter (HOSPITAL_COMMUNITY): Payer: Self-pay | Admitting: Nurse Practitioner

## 2016-12-02 ENCOUNTER — Emergency Department (HOSPITAL_COMMUNITY): Payer: Medicare PPO

## 2016-12-02 DIAGNOSIS — K659 Peritonitis, unspecified: Secondary | ICD-10-CM | POA: Diagnosis present

## 2016-12-02 DIAGNOSIS — E119 Type 2 diabetes mellitus without complications: Secondary | ICD-10-CM | POA: Diagnosis present

## 2016-12-02 DIAGNOSIS — K567 Ileus, unspecified: Secondary | ICD-10-CM | POA: Diagnosis present

## 2016-12-02 DIAGNOSIS — R601 Generalized edema: Secondary | ICD-10-CM | POA: Diagnosis present

## 2016-12-02 DIAGNOSIS — Z86718 Personal history of other venous thrombosis and embolism: Secondary | ICD-10-CM

## 2016-12-02 DIAGNOSIS — R180 Malignant ascites: Secondary | ICD-10-CM | POA: Diagnosis present

## 2016-12-02 DIAGNOSIS — Z66 Do not resuscitate: Secondary | ICD-10-CM | POA: Diagnosis present

## 2016-12-02 DIAGNOSIS — R188 Other ascites: Secondary | ICD-10-CM

## 2016-12-02 DIAGNOSIS — R110 Nausea: Secondary | ICD-10-CM

## 2016-12-02 DIAGNOSIS — D649 Anemia, unspecified: Secondary | ICD-10-CM | POA: Diagnosis not present

## 2016-12-02 DIAGNOSIS — K56600 Partial intestinal obstruction, unspecified as to cause: Secondary | ICD-10-CM | POA: Diagnosis present

## 2016-12-02 DIAGNOSIS — D630 Anemia in neoplastic disease: Secondary | ICD-10-CM | POA: Diagnosis present

## 2016-12-02 DIAGNOSIS — Z880 Allergy status to penicillin: Secondary | ICD-10-CM | POA: Diagnosis not present

## 2016-12-02 DIAGNOSIS — F329 Major depressive disorder, single episode, unspecified: Secondary | ICD-10-CM | POA: Diagnosis present

## 2016-12-02 DIAGNOSIS — E43 Unspecified severe protein-calorie malnutrition: Secondary | ICD-10-CM | POA: Diagnosis present

## 2016-12-02 DIAGNOSIS — C786 Secondary malignant neoplasm of retroperitoneum and peritoneum: Secondary | ICD-10-CM | POA: Diagnosis present

## 2016-12-02 DIAGNOSIS — I69351 Hemiplegia and hemiparesis following cerebral infarction affecting right dominant side: Secondary | ICD-10-CM | POA: Diagnosis not present

## 2016-12-02 DIAGNOSIS — E876 Hypokalemia: Secondary | ICD-10-CM | POA: Diagnosis present

## 2016-12-02 DIAGNOSIS — Z6837 Body mass index (BMI) 37.0-37.9, adult: Secondary | ICD-10-CM | POA: Diagnosis not present

## 2016-12-02 DIAGNOSIS — K652 Spontaneous bacterial peritonitis: Principal | ICD-10-CM | POA: Diagnosis present

## 2016-12-02 DIAGNOSIS — E440 Moderate protein-calorie malnutrition: Secondary | ICD-10-CM | POA: Diagnosis not present

## 2016-12-02 DIAGNOSIS — C561 Malignant neoplasm of right ovary: Secondary | ICD-10-CM | POA: Diagnosis present

## 2016-12-02 DIAGNOSIS — D72829 Elevated white blood cell count, unspecified: Secondary | ICD-10-CM

## 2016-12-02 DIAGNOSIS — E118 Type 2 diabetes mellitus with unspecified complications: Secondary | ICD-10-CM

## 2016-12-02 DIAGNOSIS — Z7901 Long term (current) use of anticoagulants: Secondary | ICD-10-CM | POA: Diagnosis not present

## 2016-12-02 DIAGNOSIS — I10 Essential (primary) hypertension: Secondary | ICD-10-CM | POA: Diagnosis present

## 2016-12-02 DIAGNOSIS — R1084 Generalized abdominal pain: Secondary | ICD-10-CM

## 2016-12-02 DIAGNOSIS — I82409 Acute embolism and thrombosis of unspecified deep veins of unspecified lower extremity: Secondary | ICD-10-CM | POA: Diagnosis not present

## 2016-12-02 DIAGNOSIS — K5909 Other constipation: Secondary | ICD-10-CM

## 2016-12-02 DIAGNOSIS — K5900 Constipation, unspecified: Secondary | ICD-10-CM | POA: Diagnosis not present

## 2016-12-02 LAB — URINALYSIS, COMPLETE (UACMP) WITH MICROSCOPIC
Bilirubin Urine: NEGATIVE
GLUCOSE, UA: 50 mg/dL — AB
KETONES UR: 20 mg/dL — AB
LEUKOCYTES UA: NEGATIVE
Nitrite: NEGATIVE
PH: 5 (ref 5.0–8.0)
Protein, ur: 30 mg/dL — AB
Specific Gravity, Urine: 1.028 (ref 1.005–1.030)

## 2016-12-02 LAB — LIPASE, BLOOD: Lipase: 11 U/L (ref 11–51)

## 2016-12-02 LAB — CBC WITH DIFFERENTIAL/PLATELET
BASOS PCT: 0 %
Band Neutrophils: 30 %
Basophils Absolute: 0 10*3/uL (ref 0.0–0.1)
EOS ABS: 0 10*3/uL (ref 0.0–0.7)
EOS PCT: 0 %
HCT: 29.6 % — ABNORMAL LOW (ref 36.0–46.0)
Hemoglobin: 9.9 g/dL — ABNORMAL LOW (ref 12.0–15.0)
LYMPHS PCT: 4 %
Lymphs Abs: 1 10*3/uL (ref 0.7–4.0)
MCH: 29.9 pg (ref 26.0–34.0)
MCHC: 33.4 g/dL (ref 30.0–36.0)
MCV: 89.4 fL (ref 78.0–100.0)
MONO ABS: 0.7 10*3/uL (ref 0.1–1.0)
MONOS PCT: 3 %
MYELOCYTES: 1 %
Metamyelocytes Relative: 2 %
NEUTROS PCT: 60 %
Neutro Abs: 23.2 10*3/uL — ABNORMAL HIGH (ref 1.7–7.7)
PLATELETS: 207 10*3/uL (ref 150–400)
RBC: 3.31 MIL/uL — AB (ref 3.87–5.11)
RDW: 15.1 % (ref 11.5–15.5)
WBC: 24.9 10*3/uL — AB (ref 4.0–10.5)

## 2016-12-02 LAB — COMPREHENSIVE METABOLIC PANEL
ALBUMIN: 1.8 g/dL — AB (ref 3.5–5.0)
ALK PHOS: 168 U/L — AB (ref 38–126)
ALT: 18 U/L (ref 14–54)
ANION GAP: 4 — AB (ref 5–15)
AST: 43 U/L — ABNORMAL HIGH (ref 15–41)
BUN: 12 mg/dL (ref 6–20)
CALCIUM: 7.5 mg/dL — AB (ref 8.9–10.3)
CHLORIDE: 100 mmol/L — AB (ref 101–111)
CO2: 31 mmol/L (ref 22–32)
Creatinine, Ser: 0.62 mg/dL (ref 0.44–1.00)
GFR calc non Af Amer: >60 mL/min (ref >60)
GLUCOSE: 170 mg/dL — AB (ref 65–99)
POTASSIUM: 3.3 mmol/L — AB (ref 3.5–5.1)
SODIUM: 135 mmol/L (ref 135–145)
Total Bilirubin: 0.1 mg/dL — ABNORMAL LOW (ref 0.3–1.2)
Total Protein: 4.6 g/dL — ABNORMAL LOW (ref 6.5–8.1)

## 2016-12-02 MED ORDER — SENNOSIDES-DOCUSATE SODIUM 8.6-50 MG PO TABS
1.0000 | ORAL_TABLET | Freq: Two times a day (BID) | ORAL | Status: DC
  Administered 2016-12-03: 1 via ORAL
  Filled 2016-12-02 (×2): qty 1

## 2016-12-02 MED ORDER — POLYETHYLENE GLYCOL 3350 17 G PO PACK: 17.0000 g | PACK | Freq: Every day | ORAL | Status: DC

## 2016-12-02 MED ORDER — CIPROFLOXACIN IN D5W 400 MG/200ML IV SOLN
500.0000 mg | Freq: Two times a day (BID) | INTRAVENOUS | Status: DC
  Filled 2016-12-02: qty 400

## 2016-12-02 MED ORDER — IOPAMIDOL (ISOVUE-300) INJECTION 61%
100.0000 mL | Freq: Once | INTRAVENOUS | Status: AC | PRN
  Administered 2016-12-02: 100 mL via INTRAVENOUS

## 2016-12-02 MED ORDER — LIDOCAINE HCL 1 % IJ SOLN: 30.0000 mL | Freq: Once | INTRAMUSCULAR | Status: DC

## 2016-12-02 MED ORDER — IOPAMIDOL (ISOVUE-300) INJECTION 61%
INTRAVENOUS | Status: AC
  Administered 2016-12-02: 21:00:00
  Filled 2016-12-02: qty 100

## 2016-12-02 MED ORDER — ONDANSETRON HCL 4 MG/2ML IJ SOLN
4.0000 mg | Freq: Once | INTRAMUSCULAR | Status: AC
  Administered 2016-12-02: 4 mg via INTRAVENOUS
  Filled 2016-12-02: qty 2

## 2016-12-02 MED ORDER — SODIUM CHLORIDE 0.9 % IV SOLN
INTRAVENOUS | Status: DC
  Administered 2016-12-02: 17:00:00 via INTRAVENOUS

## 2016-12-02 MED ORDER — CIPROFLOXACIN IN D5W 400 MG/200ML IV SOLN
400.0000 mg | Freq: Two times a day (BID) | INTRAVENOUS | Status: DC
  Administered 2016-12-02 – 2016-12-04 (×5): 400 mg via INTRAVENOUS
  Filled 2016-12-02 (×4): qty 200

## 2016-12-02 MED ORDER — ENOXAPARIN SODIUM 100 MG/ML ~~LOC~~ SOLN
1.0000 mg/kg | Freq: Once | SUBCUTANEOUS | Status: AC
  Administered 2016-12-02: 100 mg via SUBCUTANEOUS
  Filled 2016-12-02 (×2): qty 1

## 2016-12-02 NOTE — ED Provider Notes (Signed)
[facility] DEPT Provider Note   CSN: [id number] Arrival date & time: 12/02/16  1503     History   Chief Complaint Chief Complaint  Patient presents with  . Abnormal Lab Results    CA Pt  . Nausea    HPI Annette Farmer is a 71 y.o. 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 Patient reports since discharge she has been vomiting multiple times a day. Emesis has been NBNB per patient reports. Patient endorses some tightness in her abdomen, but has been moving her bowel. Patient had her last chemo treatment on 6/28 and is currently a resident of [facility] (SNF). Patient has had poor po intake and feels weak. Patient denies any chest pain, shortness of breath, diarrhea, dizziness, fever or chills.  HPI  Past Medical History:  Diagnosis Date  . Depression   . Diabetes mellitus without complication ([place])   . Hypertension   . Stroke [facility])     Patient Active Problem List   Diagnosis Date Noted  . Peritonitis ([place]) 12/02/2016  . Peritoneal carcinomatosis ([place])   . Ovarian cancer on right [facility]): Stage 3C 11/16/2016  . Ileus ([place]) 11/16/2016  . Acute thromboembolism of deep veins of right lower extremity (HCC)   . Port-a-cath in place   . DNR (do not resuscitate)   . Metastatic cancer ([place])   . Palliative care encounter   . Goals of care, counseling/discussion   . Bowel obstruction ([place])   . Severe protein-calorie malnutrition ([place])   . AKI (acute kidney injury) ([place]) 11/11/2016  . Pelvic mass 11/11/2016  . Carcinomatosis ([place]) 11/11/2016  . DVT (deep venous thrombosis) ([place]) 11/10/2016  . Refusal of statin medication by patient 09/28/2016  . Hemiparesis affecting right side as late effect of cerebrovascular accident ([place]) 04/23/2012  . Type 2 diabetes  mellitus ([place]) 07/20/2006  . HYPERCHOLESTEROLEMIA 07/20/2006  . OBESITY, NOS 07/20/2006  . HYPERTENSION, BENIGN SYSTEMIC 07/20/2006  . HEMORRHOIDS, NOS 07/20/2006  . CONSTIPATION 07/20/2006  . INCONTINENCE, STRESS, FEMALE 07/20/2006  . MENOPAUSAL SYNDROME 07/20/2006    Past Surgical History:  Procedure Laterality Date  . HERNIA REPAIR    . IR FLUORO GUIDE PORT INSERTION LEFT  11/25/2016  . IR PARACENTESIS  11/11/2016  . IR US GUIDE VASC ACCESS LEFT  11/25/2016  . UMBILICAL HERNIA REPAIR      OB History    No data available       Home Medications    Prior to Admission medications   Medication Sig Start Date End Date Taking? Authorizing Provider  alum & mag hydroxide-simeth (MAALOX/MYLANTA) 200-200-20 MG/5ML suspension Take 30 mLs by mouth every 4 (four) hours as needed for indigestion or heartburn. 11/29/16  Yes Robbie Lis, MD  bisacodyl (DULCOLAX) 10 MG suppository Place 1 suppository (10 mg total) rectally daily. 11/29/16  Yes Robbie Lis, MD  cephALEXin (KEFLEX) 500 MG capsule Take 1 capsule (500 mg total) by mouth 4 (four) times daily. 11/29/16  Yes Robbie Lis, MD  COLLAGEN PO Take 6 tablets by mouth daily.   Yes [provider]  enoxaparin (LOVENOX) 100 MG/ML injection Inject 0.85 mLs (85 mg total) into the skin every 12 (twelve) hours. 11/29/16  Yes Robbie Lis, MD  feeding supplement (BOOST / RESOURCE BREEZE) LIQD Take 1 Container by mouth  2 (two) times daily. 11/29/16  Yes Robbie Lis, MD  gabapentin (NEURONTIN) 300 MG capsule Take 1 capsule (300 mg total) by mouth at bedtime. 10/04/16  Yes Wardell Honour, MD  metoprolol succinate (TOPROL-XL) 25 MG 24 hr tablet Take 1 tablet (25 mg total) by mouth daily. 10/17/16  Yes Wardell Honour, MD  ondansetron (ZOFRAN) 4 MG tablet Take 1 tablet (4 mg total) by mouth daily as needed for nausea or vomiting. 11/29/16 11/29/17 Yes Robbie Lis, MD  pantoprazole (PROTONIX) 40 MG tablet Take 1 tablet (40 mg total) by mouth  daily at 6 (six) AM. 11/30/16  Yes Robbie Lis, MD  ranitidine (ZANTAC) 75 MG tablet Take 75 mg by mouth once.   Yes [provider]  senna (SENOKOT) 8.6 MG TABS tablet Take 2 tablets (17.2 mg total) by mouth daily. 11/29/16  Yes Robbie Lis, MD  traMADol (ULTRAM) 50 MG tablet Take 1 tablet (50 mg total) by mouth every 6 (six) hours as needed for moderate pain. 11/28/16  Yes Robbie Lis, MD  acetaminophen (TYLENOL) 325 MG tablet Take 2 tablets (650 mg total) by mouth every 6 (six) hours as needed for mild pain (or Fever >/= 101). 11/29/16   Robbie Lis, MD  albuterol (PROVENTIL) (2.5 MG/3ML) 0.083% nebulizer solution Take 3 mLs (2.5 mg total) by nebulization every 6 (six) hours as needed for wheezing or shortness of breath. 11/29/16   Robbie Lis, MD  feeding supplement, ENSURE ENLIVE, (ENSURE ENLIVE) LIQD Take 237 mLs by mouth daily. Patient not taking: Reported on 12/02/2016 11/29/16   Robbie Lis, MD  guaiFENesin-dextromethorphan [facility] DM) 100-10 MG/5ML syrup Take 5 mLs by mouth every 4 (four) hours as needed for cough. 11/29/16   Robbie Lis, MD  insulin aspart (NOVOLOG) 100 UNIT/ML injection Inject 0-9 Units into the skin 3 (three) times daily with meals. Patient not taking: Reported on 12/02/2016 11/29/16   Robbie Lis, MD  nystatin (MYCOSTATIN/NYSTOP) powder Apply topically 3 (three) times daily. TO RASH OF R GROIN Patient not taking: Reported on 12/02/2016 11/05/16   Wendie Agreste, MD    Family History Family History  Problem Relation Age of Onset  . Parkinson's disease Mother   . Dementia Father   . Cancer Sister 34       Breast cancer    Social History Social History  Substance Use Topics  . Smoking status: Never Smoker  . Smokeless tobacco: Never Used  . Alcohol use No     Allergies   Penicillins   Review of Systems Review of Systems  Constitutional: Positive for fatigue.  HENT: Negative.   Eyes: Negative.   Respiratory: Negative.     Cardiovascular: Negative.   Gastrointestinal: Positive for abdominal distention, abdominal pain, nausea and vomiting.  Endocrine: Negative.   Genitourinary: Negative.   Musculoskeletal: Negative.   Skin: Negative.   Allergic/Immunologic: Negative.   Neurological: Negative.   Hematological: Negative.   Psychiatric/Behavioral: Negative.      Physical Exam Updated Vital Signs BP 111/73 (BP Location: Right Arm)   Pulse 72   Temp 98.6 F (37 C) (Oral)   Resp 16   SpO2 100%   Physical Exam  Constitutional: She is oriented to person, place, and time. She appears well-developed.  HENT:  Head: Normocephalic and atraumatic.  Eyes: Pupils are equal, round, and reactive to light. EOM are normal.  Neck: Normal range of motion. Neck supple.  Cardiovascular: Normal rate, regular  rhythm and normal heart sounds.   Pulmonary/Chest: Effort normal and breath sounds normal.  Abdominal: Bowel sounds are normal. She exhibits distension. There is tenderness.  Musculoskeletal: Normal range of motion.  Neurological: She is alert and oriented to person, place, and time.  Limited neuro exam given patient weakness, but she does have baseline right sided weakness 2/2 CVA.  Skin: Skin is warm and dry.  Psychiatric: She has a normal mood and affect. Her behavior is normal.     ED Treatments / Results  Labs (all labs ordered are listed, but only abnormal results are displayed) Labs Reviewed  COMPREHENSIVE METABOLIC PANEL - Abnormal; Notable for the following:       Result Value   Potassium 3.3 (*)    Chloride 100 (*)    Glucose, Bld 170 (*)    Calcium 7.5 (*)    Total Protein 4.6 (*)    Albumin 1.8 (*)    AST 43 (*)    Alkaline Phosphatase 168 (*)    Total Bilirubin 0.1 (*)    Anion gap 4 (*)    All other components within normal limits  CBC WITH DIFFERENTIAL/PLATELET - Abnormal; Notable for the following:    WBC 24.9 (*)    RBC 3.31 (*)    Hemoglobin 9.9 (*)    HCT 29.6 (*)    Neutro  Abs 23.2 (*)    All other components within normal limits  URINALYSIS, COMPLETE (UACMP) WITH MICROSCOPIC - Abnormal; Notable for the following:    APPearance HAZY (*)    Glucose, UA 50 (*)    Hgb urine dipstick SMALL (*)    Ketones, ur 20 (*)    Protein, ur 30 (*)    Bacteria, UA FEW (*)    Squamous Epithelial / LPF 0-5 (*)    All other components within normal limits  LIPASE, BLOOD    EKG  EKG Interpretation None       Radiology Ct Abdomen Pelvis W Contrast  Result Date: 12/02/2016 CLINICAL DATA:  71 y/o F; r/o sbo, Hx of ovarian CA, now w n/v, abd discomfort. EXAM: CT ABDOMEN AND PELVIS WITH CONTRAST TECHNIQUE: Multidetector CT imaging of the abdomen and pelvis was performed using the standard protocol following bolus administration of intravenous contrast. CONTRAST:  176mL ISOVUE-300 IOPAMIDOL (ISOVUE-300) INJECTION 61% COMPARISON:  11/12/2014 CT abdomen and pelvis. FINDINGS: Lower chest: Coronary artery calcifications. Small bilateral pleural effusions. Hepatobiliary: Hepatic steatosis. Cholelithiasis. No intra or extrahepatic biliary ductal dilatation. Pancreas: Unremarkable. No pancreatic ductal dilatation or surrounding inflammatory changes. Spleen: Normal in size without focal abnormality. Adrenals/Urinary Tract: Adrenal glands are unremarkable. Kidneys are normal, without renal calculi, focal lesion, or hydronephrosis. Bladder is unremarkable. Stomach/Bowel: No obstructive or inflammatory changes of bowel. Large volume of stool in rectum, question constipation. Vascular/Lymphatic: Aortic atherosclerosis. No enlarged abdominal or pelvic lymph nodes. Reproductive: Stable 4 cm left ovarian cyst. Stable right adnexal mass measuring 59 x 51 x 36 mm (AP x ML x CC series 2, image 71 and series 3, image 62). Other: Moderate volume of peritoneal ascites is increased from prior CT of abdomen and pelvis. Nodular thickening within the omentum is decreased from the prior CT. There is persistent  peritoneal carcinomatosis within omentum and pericolic gutters with small foci of enhancement. Increased anasarca within the abdominal wall. Musculoskeletal: No acute or significant osseous findings. IMPRESSION: 1. Moderate volume of ascites increased from prior CT and increased anasarca. 2. Omental and peritoneal carcinomatosis is mildly decreased. 3. Stable right adnexal  mass measuring up to 5.9 cm. 4. Hepatic steatosis.  Cholelithiasis. 5. Small bilateral pleural effusions. 6. Large volume of stool in rectum, question constipation. Electronically Signed   By: Kristine Garbe M.D.   On: 12/02/2016 19:35    Procedures Procedures (including critical care time)  Medications Ordered in ED Medications  0.9 %  sodium chloride infusion ( Intravenous New Bag/Given 12/02/16 1633)  ciprofloxacin (CIPRO) IVPB 500 mg (not administered)  polyethylene glycol (MIRALAX / GLYCOLAX) packet 17 g (not administered)  senna-docusate (Senokot-S) tablet 1 tablet (not administered)  lidocaine (XYLOCAINE) 1 % (with pres) injection 30 mL (0 mLs Intradermal Hold 12/02/16 2126)  ondansetron (ZOFRAN) injection 4 mg (4 mg Intravenous Given 12/02/16 1633)  iopamidol (ISOVUE-300) 61 % injection (  Contrast Given 12/02/16 2126)  iopamidol (ISOVUE-300) 61 % injection 100 mL (100 mLs Intravenous Contrast Given 12/02/16 1909)     Initial Impression / Assessment and Plan / ED Course  Patient is a 71 yo female with a recent diagnosis of right ovarian cancer (adenocarcinoma, stage 3C) s/p 1 cycle of chemo who presented w/ nausea, vomiting and mild abdominal pain. Patient had a recent ileus but reports BM since discharge with normal BS on exam. Patient has some peritoneal carcinomatosis with a history of ascites. Appears mildly distended on exam, but no fluid wave noted on exam. Abdominal CT showed moderate volume ascites increased from prior CT and large stool burden. Given elevated WBCs 24.9 with left shift concern for SBP. UA  is unremarkable. Diagnostic tab was attempted with bedside US but not fluid pocket could be located. Patient's vitals are stable, but given concern for SBP will start patient on ciprofloxacin. Patent will likely need an IR guided paracentesis. Constipation also contributing to patient nausea and vomitin and patient was started on bowel regimen with miralax and Senokot. Consulted Hospitalist Service which will admit patient for further medical management.    I have reviewed the triage vital signs and the nursing notes.  Pertinent labs & imaging results that were available during my care of the patient were reviewed by me and considered in my medical decision making (see chart for details).     Final Clinical Impressions(s) / ED Diagnoses   Final diagnoses:  Nausea  Ascites, malignant  Generalized abdominal pain    New Prescriptions New Prescriptions   No medications on file     Marjie Skiff, MD 12/02/16 2226    Marjie Skiff, MD 12/02/16 2229    Carmin Muskrat, MD 12/03/16 2319

## 2016-12-02 NOTE — ED Notes (Signed)
ED Provider at bedside. 

## 2016-12-02 NOTE — ED Triage Notes (Signed)
Pt is presented from [facility] and [place] for evaluation of an elevated white count with labs drawn 2 days ago 11/30/16. Pt is c/o nausea, reports ongoing ovarian ca treatment and recent hospitalization for DVTs. Has no other complaints.

## 2016-12-02 NOTE — ED Notes (Signed)
Bed: [facility] Expected date:  Expected time:  Means of arrival:  Comments: EMS 71 y/o elevated WBC

## 2016-12-02 NOTE — ED Notes (Signed)
Patient transported to CT 

## 2016-12-02 NOTE — H&P (Signed)
[facility]  History and Physical Annette Farmer L. Ardeth Perfect, MD Pager [phone] (if 7P to 7A, page night hospitalist on [email or web address] Farmer QAS:[id number] DOB: [birth year] DOA: 12/02/2016  Referring physician: ED  PCP: Wardell Honour, MD   Chief Complaint: abd pain ,n/v,intol to PO   HPI:  Pt w/ hx of stage 3 ovarian cancer w/ omental involvement who has progressive ascitis now presenting w/ N/V , progressive distension ,andWBC of 24.9 . Upon interview, she states that for the past 4 weeks she has been generally intolerant to food and will vary day to day as to whether she has emesis .What lead to today's presentation was the SNF finally did lab work and noted leukocytosis , so she was taken to ED for w/u . She does not recall having lovenox this evening but does recall having it this morning .   In ED, Unable to tap the fluid at bedside , started on cipro IV in the ED . Needs eval and treat of potential SBP by IM w/ admission . She is full code   WBC 24.9 . VSS . CT w/ constipation , increasing ascitis, ovarian lesion, omental involvement .   Of note, hx of recent DVT on therapeutic lovenox thatwill be continued . First round of chemo was recently completed   Chart Review:  Oncology notes, ED notes , labs , images   Review of Systems:  Patient has distension of abdomen , N/V , weight loss , malaise   Past Medical History:  Diagnosis Date  . Depression   . Diabetes mellitus without complication ([place])   . Hypertension   . Stroke [facility])     Past Surgical History:  Procedure Laterality Date  . HERNIA REPAIR    . IR FLUORO GUIDE PORT INSERTION LEFT  11/25/2016  . IR PARACENTESIS  11/11/2016  . IR US GUIDE VASC ACCESS LEFT  11/25/2016  . UMBILICAL HERNIA REPAIR      Social History:  reports that she has never smoked. She has never used smokeless tobacco. She reports that she does not drink alcohol or use drugs.  Allergies  Allergen Reactions  . Penicillins Nausea Only    Has  patient had a PCN reaction causing immediate rash, facial/tongue/throat swelling, SOB or lightheadedness with hypotension: NO Has patient had a PCN reaction causing severe rash involving mucus membranes or skin necrosis: NO Has patient had a PCN reaction that required hospitalization: NO Has patient had a PCN reaction occurring within the last 10 years: NO If all of the above answers are "NO", then may proceed with Cephalosporin use.     Family History  Problem Relation Age of Onset  . Parkinson's disease Mother   . Dementia Father   . Cancer Sister 57       Breast cancer     Prior to Admission medications   Medication Sig Start Date End Date Taking? Authorizing Provider  alum & mag hydroxide-simeth (MAALOX/MYLANTA) 200-200-20 MG/5ML suspension Take 30 mLs by mouth every 4 (four) hours as needed for indigestion or heartburn. 11/29/16  Yes Robbie Lis, MD  bisacodyl (DULCOLAX) 10 MG suppository Place 1 suppository (10 mg total) rectally daily. 11/29/16  Yes Robbie Lis, MD  cephALEXin (KEFLEX) 500 MG capsule Take 1 capsule (500 mg total) by mouth 4 (four) times daily. 11/29/16  Yes Robbie Lis, MD  COLLAGEN PO Take 6 tablets by mouth daily.   Yes [provider]  enoxaparin (LOVENOX) 100 MG/ML injection  Inject 0.85 mLs (85 mg total) into the skin every 12 (twelve) hours. 11/29/16  Yes Robbie Lis, MD  feeding supplement (BOOST / RESOURCE BREEZE) LIQD Take 1 Container by mouth 2 (two) times daily. 11/29/16  Yes Robbie Lis, MD  gabapentin (NEURONTIN) 300 MG capsule Take 1 capsule (300 mg total) by mouth at bedtime. 10/04/16  Yes Wardell Honour, MD  metoprolol succinate (TOPROL-XL) 25 MG 24 hr tablet Take 1 tablet (25 mg total) by mouth daily. 10/17/16  Yes Wardell Honour, MD  ondansetron (ZOFRAN) 4 MG tablet Take 1 tablet (4 mg total) by mouth daily as needed for nausea or vomiting. 11/29/16 11/29/17 Yes Robbie Lis, MD  pantoprazole (PROTONIX) 40 MG tablet Take 1 tablet  (40 mg total) by mouth daily at 6 (six) AM. 11/30/16  Yes Robbie Lis, MD  ranitidine (ZANTAC) 75 MG tablet Take 75 mg by mouth once.   Yes [provider]  senna (SENOKOT) 8.6 MG TABS tablet Take 2 tablets (17.2 mg total) by mouth daily. 11/29/16  Yes Robbie Lis, MD  traMADol (ULTRAM) 50 MG tablet Take 1 tablet (50 mg total) by mouth every 6 (six) hours as needed for moderate pain. 11/28/16  Yes Robbie Lis, MD  acetaminophen (TYLENOL) 325 MG tablet Take 2 tablets (650 mg total) by mouth every 6 (six) hours as needed for mild pain (or Fever >/= 101). 11/29/16   Robbie Lis, MD  albuterol (PROVENTIL) (2.5 MG/3ML) 0.083% nebulizer solution Take 3 mLs (2.5 mg total) by nebulization every 6 (six) hours as needed for wheezing or shortness of breath. 11/29/16   Robbie Lis, MD  feeding supplement, ENSURE ENLIVE, (ENSURE ENLIVE) LIQD Take 237 mLs by mouth daily. Patient not taking: Reported on 12/02/2016 11/29/16   Robbie Lis, MD  guaiFENesin-dextromethorphan [facility] DM) 100-10 MG/5ML syrup Take 5 mLs by mouth every 4 (four) hours as needed for cough. 11/29/16   Robbie Lis, MD  insulin aspart (NOVOLOG) 100 UNIT/ML injection Inject 0-9 Units into the skin 3 (three) times daily with meals. Patient not taking: Reported on 12/02/2016 11/29/16   Robbie Lis, MD  nystatin (MYCOSTATIN/NYSTOP) powder Apply topically 3 (three) times daily. TO RASH OF R GROIN Patient not taking: Reported on 12/02/2016 11/05/16   Wendie Agreste, MD   Physical Exam: Vitals:   12/02/16 1510 12/02/16 1722 12/02/16 1841  BP: 119/86 114/76 112/83  Pulse: 84 76 75  Resp: 16 16 16   Temp: 98.6 F (37 C)    TempSrc: Oral    SpO2: 96% 92% 98%     General:  WF in NAD   HEENT: dry MM, anicteric   Cardiovascular: RRR, no mrg   Respiratory: ctab, no rales   Abdomen: distended , not firm , tender to deep palpation only w/o localization   Skin: warm , dry   Musculoskeletal: no focal deficits    Psychiatric: some anxiety related to recent dx   Neurologic: no focal deficit  Wt Readings from Last 3 Encounters:  11/29/16 99.5 kg (219 lb 5.7 oz)  11/05/16 73 kg (161 lb)  09/28/16 73.3 kg (161 lb 9.6 oz)    Labs on Admission:  Basic Metabolic Panel:  Recent Labs Lab 11/26/16 0445 11/27/16 0615 12/02/16 1628  NA 128* 130* 135  K 3.9 3.7 3.3*  CL 98* 99* 100*  CO2 26 24 31   GLUCOSE 141* 116* 170*  BUN 13 13 12   CREATININE 0.83 0.90  0.62  CALCIUM 7.2* 7.3* 7.5*   Liver Function Tests:  Recent Labs Lab 12/02/16 1628  AST 43*  ALT 18  ALKPHOS 168*  BILITOT 0.1*  PROT 4.6*  ALBUMIN 1.8*    Recent Labs Lab 12/02/16 1628  LIPASE 11   No results for input(s): AMMONIA in the last 168 hours. CBC:  Recent Labs Lab 11/26/16 0445 11/27/16 0615 11/28/16 0352 11/29/16 0813 12/02/16 1628  WBC 0.7* 1.2* 3.2* 13.9* 24.9*  NEUTROABS 0.2* 0.3* 1.5* 10.9* 23.2*  HGB 8.9* 8.8* 9.5* 9.7* 9.9*  HCT 25.7* 26.1* 28.2* 28.8* 29.6*  MCV 87.4 86.4 86.0 87.3 89.4  PLT 157 149* 168 187 207   Cardiac Enzymes: No results for input(s): CKTOTAL, CKMB, CKMBINDEX, TROPONINI in the last 168 hours.  BNP (last 3 results) No results for input(s): BNP in the last 8760 hours.  ProBNP (last 3 results) No results for input(s): PROBNP in the last 8760 hours.  CBG:  Recent Labs Lab 11/27/16 2132 11/28/16 0752 11/28/16 1221 11/28/16 2201 11/29/16 0742  GLUCAP 138* 107* 118* 156* 133*    Radiological Exams on Admission: Ct Abdomen Pelvis W Contrast  Result Date: 12/02/2016 CLINICAL DATA:  71 y/o F; r/o sbo, Hx of ovarian CA, now w n/v, abd discomfort. EXAM: CT ABDOMEN AND PELVIS WITH CONTRAST TECHNIQUE: Multidetector CT imaging of the abdomen and pelvis was performed using the standard protocol following bolus administration of intravenous contrast. CONTRAST:  11mL ISOVUE-300 IOPAMIDOL (ISOVUE-300) INJECTION 61% COMPARISON:  11/12/2014 CT abdomen and pelvis. FINDINGS:  Lower chest: Coronary artery calcifications. Small bilateral pleural effusions. Hepatobiliary: Hepatic steatosis. Cholelithiasis. No intra or extrahepatic biliary ductal dilatation. Pancreas: Unremarkable. No pancreatic ductal dilatation or surrounding inflammatory changes. Spleen: Normal in size without focal abnormality. Adrenals/Urinary Tract: Adrenal glands are unremarkable. Kidneys are normal, without renal calculi, focal lesion, or hydronephrosis. Bladder is unremarkable. Stomach/Bowel: No obstructive or inflammatory changes of bowel. Large volume of stool in rectum, question constipation. Vascular/Lymphatic: Aortic atherosclerosis. No enlarged abdominal or pelvic lymph nodes. Reproductive: Stable 4 cm left ovarian cyst. Stable right adnexal mass measuring 59 x 51 x 36 mm (AP x ML x CC series 2, image 71 and series 3, image 62). Other: Moderate volume of peritoneal ascites is increased from prior CT of abdomen and pelvis. Nodular thickening within the omentum is decreased from the prior CT. There is persistent peritoneal carcinomatosis within omentum and pericolic gutters with small foci of enhancement. Increased anasarca within the abdominal wall. Musculoskeletal: No acute or significant osseous findings. IMPRESSION: 1. Moderate volume of ascites increased from prior CT and increased anasarca. 2. Omental and peritoneal carcinomatosis is mildly decreased. 3. Stable right adnexal mass measuring up to 5.9 cm. 4. Hepatic steatosis.  Cholelithiasis. 5. Small bilateral pleural effusions. 6. Large volume of stool in rectum, question constipation. Electronically Signed   By: Kristine Garbe M.D.   On: 12/02/2016 19:35    EKG: Independently reviewed.   Active Problems:   Peritonitis ([place])   Assessment/Plan 1. ascitis and leukocytosis - concern for SBP. Started on cipro IV which will be continue on admission . IVF w/ NS . NPO after MN for paracentesis by IR in the morning , order placed including  culture, cell count, etc for eval of SBP . HOLDING LOVENOX IN AM for  PROCEDURE  2. Constipation on CT - miralax bid and enema if needed 3. Stage 3 ovarian cancer - day team can alert the oncologist of her admission , not urgent. Not due for chemo  in coming days . Was not neutropenic so precautions not ordered  4. RLE DVT - continuing home lovenox at therapeutic dosing of 1mg /kg . She did not receive in facility PM dose prior to arrival , so giving 1mg /kg in ED, then holding until after procedure in AM 5. Severe protein calorie malnutrition -home boost being continued . Consulted nutrition   Code Status: FULL CODE  Family Communication: none Disposition Plan/Anticipated LOS: to the floor for admission . Est LOS 3-5 days   Time spent: 60 minutes  Velna Hatchet, MD  Internal Medicine Pager [phone] Cell [phone] If 7PM-7AM, please contact night-coverage at [email or web address], password [facility] 12/02/2016, 10:23 PM

## 2016-12-03 ENCOUNTER — Inpatient Hospital Stay (HOSPITAL_COMMUNITY): Payer: Medicare PPO

## 2016-12-03 DIAGNOSIS — I82409 Acute embolism and thrombosis of unspecified deep veins of unspecified lower extremity: Secondary | ICD-10-CM

## 2016-12-03 DIAGNOSIS — R601 Generalized edema: Secondary | ICD-10-CM

## 2016-12-03 LAB — COMPREHENSIVE METABOLIC PANEL
ALBUMIN: 1.8 g/dL — AB (ref 3.5–5.0)
ALK PHOS: 154 U/L — AB (ref 38–126)
ALT: 18 U/L (ref 14–54)
ANION GAP: 3 — AB (ref 5–15)
AST: 40 U/L (ref 15–41)
BUN: 11 mg/dL (ref 6–20)
CHLORIDE: 100 mmol/L — AB (ref 101–111)
CO2: 33 mmol/L — AB (ref 22–32)
Calcium: 7.5 mg/dL — ABNORMAL LOW (ref 8.9–10.3)
Creatinine, Ser: 0.58 mg/dL (ref 0.44–1.00)
GFR calc Af Amer: >60 mL/min (ref >60)
GFR calc non Af Amer: >60 mL/min (ref >60)
GLUCOSE: 106 mg/dL — AB (ref 65–99)
Potassium: 3.2 mmol/L — ABNORMAL LOW (ref 3.5–5.1)
SODIUM: 136 mmol/L (ref 135–145)
Total Bilirubin: 0.5 mg/dL (ref 0.3–1.2)
Total Protein: 4.4 g/dL — ABNORMAL LOW (ref 6.5–8.1)

## 2016-12-03 LAB — CBC
HCT: 29.1 % — ABNORMAL LOW (ref 36.0–46.0)
HEMOGLOBIN: 9.5 g/dL — AB (ref 12.0–15.0)
MCH: 29.5 pg (ref 26.0–34.0)
MCHC: 32.6 g/dL (ref 30.0–36.0)
MCV: 90.4 fL (ref 78.0–100.0)
Platelets: 203 10*3/uL (ref 150–400)
RBC: 3.22 MIL/uL — ABNORMAL LOW (ref 3.87–5.11)
RDW: 15.1 % (ref 11.5–15.5)
WBC: 17 10*3/uL — ABNORMAL HIGH (ref 4.0–10.5)

## 2016-12-03 LAB — GLUCOSE, CAPILLARY
GLUCOSE-CAPILLARY: 108 mg/dL — AB (ref 65–99)
GLUCOSE-CAPILLARY: 111 mg/dL — AB (ref 65–99)
GLUCOSE-CAPILLARY: 101 mg/dL — AB (ref 65–99)
Glucose-Capillary: 126 mg/dL — ABNORMAL HIGH (ref 65–99)

## 2016-12-03 MED ORDER — GABAPENTIN 300 MG PO CAPS
300.0000 mg | ORAL_CAPSULE | Freq: Every day | ORAL | Status: DC
  Administered 2016-12-03 – 2016-12-06 (×4): 300 mg via ORAL
  Filled 2016-12-03 (×4): qty 1

## 2016-12-03 MED ORDER — ONDANSETRON HCL 4 MG PO TABS: 4.0000 mg | ORAL_TABLET | Freq: Four times a day (QID) | ORAL | Status: DC | PRN

## 2016-12-03 MED ORDER — METOPROLOL SUCCINATE ER 25 MG PO TB24
25.0000 mg | ORAL_TABLET | Freq: Every day | ORAL | Status: DC
  Administered 2016-12-03 – 2016-12-07 (×5): 25 mg via ORAL
  Filled 2016-12-03 (×5): qty 1

## 2016-12-03 MED ORDER — ENOXAPARIN SODIUM 100 MG/ML ~~LOC~~ SOLN
85.0000 mg | Freq: Two times a day (BID) | SUBCUTANEOUS | Status: DC
  Administered 2016-12-03 – 2016-12-07 (×8): 85 mg via SUBCUTANEOUS
  Filled 2016-12-03 (×8): qty 1

## 2016-12-03 MED ORDER — ONDANSETRON HCL 4 MG/2ML IJ SOLN
4.0000 mg | Freq: Four times a day (QID) | INTRAMUSCULAR | Status: DC | PRN
  Filled 2016-12-03: qty 2

## 2016-12-03 MED ORDER — ALBUTEROL SULFATE (2.5 MG/3ML) 0.083% IN NEBU: 2.5000 mg | INHALATION_SOLUTION | Freq: Four times a day (QID) | RESPIRATORY_TRACT | Status: DC | PRN

## 2016-12-03 MED ORDER — BOOST / RESOURCE BREEZE PO LIQD
1.0000 | Freq: Two times a day (BID) | ORAL | Status: DC
  Administered 2016-12-03 – 2016-12-04 (×3): 1 via ORAL

## 2016-12-03 MED ORDER — FUROSEMIDE 10 MG/ML IJ SOLN
20.0000 mg | Freq: Two times a day (BID) | INTRAMUSCULAR | Status: DC
  Administered 2016-12-03: 20 mg via INTRAVENOUS
  Filled 2016-12-03: qty 2

## 2016-12-03 MED ORDER — ACETAMINOPHEN 325 MG PO TABS
650.0000 mg | ORAL_TABLET | Freq: Four times a day (QID) | ORAL | Status: DC | PRN
Start: 2016-12-03 — End: 2016-12-07
  Filled 2016-12-03: qty 2

## 2016-12-03 MED ORDER — SODIUM CHLORIDE 0.9 % IV SOLN
INTRAVENOUS | Status: DC
  Administered 2016-12-03: 08:00:00 via INTRAVENOUS

## 2016-12-03 MED ORDER — FENTANYL CITRATE (PF) 100 MCG/2ML IJ SOLN
25.0000 ug | INTRAMUSCULAR | Status: AC | PRN
  Administered 2016-12-03 (×2): 25 ug via INTRAVENOUS
  Filled 2016-12-03 (×2): qty 2

## 2016-12-03 MED ORDER — FLEET ENEMA 7-19 GM/118ML RE ENEM
1.0000 | ENEMA | Freq: Once | RECTAL | Status: DC | PRN
Start: 2016-12-03 — End: 2016-12-03

## 2016-12-03 MED ORDER — PANTOPRAZOLE SODIUM 40 MG PO TBEC
40.0000 mg | DELAYED_RELEASE_TABLET | Freq: Every day | ORAL | Status: DC
  Administered 2016-12-03 – 2016-12-07 (×5): 40 mg via ORAL
  Filled 2016-12-03 (×6): qty 1

## 2016-12-03 MED ORDER — POLYETHYLENE GLYCOL 3350 17 G PO PACK
17.0000 g | PACK | Freq: Two times a day (BID) | ORAL | Status: DC
  Administered 2016-12-03: 17 g via ORAL
  Filled 2016-12-03: qty 1

## 2016-12-03 MED ORDER — ALUM & MAG HYDROXIDE-SIMETH 200-200-20 MG/5ML PO SUSP
30.0000 mL | ORAL | Status: DC | PRN
  Administered 2016-12-04 (×2): 30 mL via ORAL
  Filled 2016-12-03 (×2): qty 30

## 2016-12-03 MED ORDER — ONDANSETRON HCL 4 MG/2ML IJ SOLN
4.0000 mg | Freq: Four times a day (QID) | INTRAMUSCULAR | Status: DC | PRN
  Administered 2016-12-03: 4 mg via INTRAVENOUS
  Filled 2016-12-03: qty 2

## 2016-12-03 MED ORDER — ALBUMIN HUMAN 25 % IV SOLN
12.5000 g | Freq: Once | INTRAVENOUS | Status: AC
  Administered 2016-12-03: 12.5 g via INTRAVENOUS
  Filled 2016-12-03: qty 50

## 2016-12-03 MED ORDER — TRAMADOL HCL 50 MG PO TABS
50.0000 mg | ORAL_TABLET | Freq: Four times a day (QID) | ORAL | Status: DC | PRN
  Administered 2016-12-03 – 2016-12-06 (×6): 50 mg via ORAL
  Filled 2016-12-03 (×7): qty 1

## 2016-12-03 NOTE — Evaluation (Signed)
PT Cancellation Note  Patient Details Name: Annette Farmer MRN: [id number] DOB: Apr 04, 1946   Cancelled Treatment:  Attempted to see patient today, but she adamantly refused. Will check back with patient at another time.     Donato Heinz. Owens Shark, PT   Norwood Levo 12/03/2016, 1:14 PM

## 2016-12-03 NOTE — Progress Notes (Signed)
  Limited ultrasound of abdomen was performed prior to possible paracentesis.  There is a small to moderate amount of fluid but no safe window due to numerous mobile loops of bowel to proceed safely with paracentesis  Samar Dass S Yashar Inclan PA-C 12/03/2016 11:41 AM

## 2016-12-03 NOTE — Progress Notes (Signed)
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 Patient was admitted for possible SBP was placed on antibiotics and IR was consulted for paracentesis. Abdominal ultrasound was performed but there was not a safe window to do paracentesis in this moment. Will continue with Abx and attempt paracentesis tomorrow.   Subjective: Patient seen and examined, she report feeling distended and full of fluid. C/o mild abdominal pain. Patient want to eat. No other concerns at this time.   Assessment & Plan: Peritoneal carcinomatosis - with ascites Suspected SBP - Given increase in abdominal girth, increasing ascites on CT abd and elevated white count. She was started on empiric antibiotic with IV ciprofloxacin. Paracentesis was ordered but ultrasound did not reveal a safe window for procedure Patient was started on neoadjuvant chemotherapy on 11/17/2016. Will discuss w medical oncologist  Anasarca Likely to be cancer related with hypoalbuminemia We'll give 1 dose of albumin and place on IV Lasix Strict I&O's   Ovarian cancer stage III See above  Right lower extremity DVT Patient Lovenox at home, we'll continue therapeutic dose and hold before any indicated procedure.  Severe protein caloric malnutrition Due to overlying cancer Nutrition consult  DVT prophylaxis: Lovenox sq  Code Status: DNR Family Communication: None at bedside  Disposition Plan: SNF when medically stable  Consultants:   IR   Procedures:   None   Antimicrobials: Anti-infectives    Start     Dose/Rate Route Frequency  Ordered Stop   12/03/16 0000  ciprofloxacin (CIPRO) IVPB 400 mg     400 mg 200 mL/hr over 60 Minutes Intravenous Every 12 hours 12/02/16 2352     12/02/16 2200  ciprofloxacin (CIPRO) IVPB 500 mg  Status:  Discontinued     500 mg 250 mL/hr over 60 Minutes Intravenous Every 12 hours 12/02/16 2138 12/02/16 2352       Objective: Vitals:   12/02/16 2223 12/02/16 2230 12/03/16 0004 12/03/16 1407  BP: 111/73 107/73 104/71 111/78  Pulse: 72 72 74 69  Resp: 16  14 16   Temp:   98 F (36.7 C) 97.9 F (36.6 C)  TempSrc:   Oral Oral  SpO2: 100% 98% 98% 100%  Weight:   92.4 kg (203 lb 11.3 oz)   Height:   5' 1.5" (1.562 m)     Intake/Output Summary (Last 24 hours) at 12/03/16 1637 Last data filed at 12/03/16 0700  Gross per 24 hour  Intake            344.5 ml  Output              100 ml  Net            244.5 ml   Filed Weights   12/03/16 0004  Weight: 92.4 kg (203 lb 11.3 oz)    Examination:  General exam: Appears calm and comfortable, anasarca HEENT: AC/AT, PERRLA, OP moist and clear Respiratory system: Clear to auscultation. No wheezes,crackle or rhonchi Cardiovascular system: S1 & S2 heard, RRR. No JVD, murmurs, rubs or gallops Gastrointestinal system: Obese, severe distention, + BS, mild TTP RUQ & RLQ  Central nervous system: Alert and oriented.  Extremities: Edema in  all four extremities    Skin: No rashes Psychiatry: Judgement and insight appear normal. Mood & affect appropriate.    Data Reviewed: I have personally reviewed following labs and imaging studies  CBC:  Recent Labs Lab 11/27/16 0615 11/28/16 0352 11/29/16 0813 12/02/16 1628 12/03/16 0821  WBC 1.2* 3.2* 13.9* 24.9* 17.0*  NEUTROABS 0.3* 1.5* 10.9* 23.2*  --   HGB 8.8* 9.5* 9.7* 9.9* 9.5*  HCT 26.1* 28.2* 28.8* 29.6* 29.1*  MCV 86.4 86.0 87.3 89.4 90.4  PLT 149* 168 187 207 660   Basic Metabolic Panel:  Recent Labs Lab 11/27/16 0615 12/02/16 1628 12/03/16 0821  NA 130* 135 136  K 3.7 3.3*  3.2*  CL 99* 100* 100*  CO2 24 31 33*  GLUCOSE 116* 170* 106*  BUN 13 12 11   CREATININE 0.90 0.62 0.58  CALCIUM 7.3* 7.5* 7.5*   GFR: Estimated Creatinine Clearance: 66.6 mL/min (by C-G formula based on SCr of 0.58 mg/dL). Liver Function Tests:  Recent Labs Lab 12/02/16 1628 12/03/16 0821  AST 43* 40  ALT 18 18  ALKPHOS 168* 154*  BILITOT 0.1* 0.5  PROT 4.6* 4.4*  ALBUMIN 1.8* 1.8*    Recent Labs Lab 12/02/16 1628  LIPASE 11   No results for input(s): AMMONIA in the last 168 hours. Coagulation Profile: No results for input(s): INR, PROTIME in the last 168 hours. Cardiac Enzymes: No results for input(s): CKTOTAL, CKMB, CKMBINDEX, TROPONINI in the last 168 hours. BNP (last 3 results) No results for input(s): PROBNP in the last 8760 hours. HbA1C: No results for input(s): HGBA1C in the last 72 hours. CBG:  Recent Labs Lab 11/28/16 1221 11/28/16 2201 11/29/16 0742 12/03/16 0756 12/03/16 1214  GLUCAP 118* 156* 133* 108* 126*   Lipid Profile: No results for input(s): CHOL, HDL, LDLCALC, TRIG, CHOLHDL, LDLDIRECT in the last 72 hours. Thyroid Function Tests: No results for input(s): TSH, T4TOTAL, FREET4, T3FREE, THYROIDAB in the last 72 hours. Anemia Panel: No results for input(s): VITAMINB12, FOLATE, FERRITIN, TIBC, IRON, RETICCTPCT in the last 72 hours. Sepsis Labs: No results for input(s): PROCALCITON, LATICACIDVEN in the last 168 hours.  No results found for this or any previous visit (from the past 240 hour(s)).    Radiology Studies: Ct Abdomen Pelvis W Contrast  Result Date: 12/02/2016 CLINICAL DATA:  71 y/o F; r/o sbo, Hx of ovarian CA, now w n/v, abd discomfort. EXAM: CT ABDOMEN AND PELVIS WITH CONTRAST TECHNIQUE: Multidetector CT imaging of the abdomen and pelvis was performed using the standard protocol following bolus administration of intravenous contrast. CONTRAST:  164mL ISOVUE-300 IOPAMIDOL (ISOVUE-300) INJECTION 61% COMPARISON:  11/12/2014 CT  abdomen and pelvis. FINDINGS: Lower chest: Coronary artery calcifications. Small bilateral pleural effusions. Hepatobiliary: Hepatic steatosis. Cholelithiasis. No intra or extrahepatic biliary ductal dilatation. Pancreas: Unremarkable. No pancreatic ductal dilatation or surrounding inflammatory changes. Spleen: Normal in size without focal abnormality. Adrenals/Urinary Tract: Adrenal glands are unremarkable. Kidneys are normal, without renal calculi, focal lesion, or hydronephrosis. Bladder is unremarkable. Stomach/Bowel: No obstructive or inflammatory changes of bowel. Large volume of stool in rectum, question constipation. Vascular/Lymphatic: Aortic atherosclerosis. No enlarged abdominal or pelvic lymph nodes. Reproductive: Stable 4 cm left ovarian cyst. Stable right adnexal mass measuring 59 x 51 x 36 mm (AP x ML x CC series 2, image 71 and series 3, image 62). Other: Moderate volume of peritoneal ascites is increased from prior CT of abdomen and pelvis. Nodular thickening within the omentum is decreased from the prior CT. There is persistent peritoneal  carcinomatosis within omentum and pericolic gutters with small foci of enhancement. Increased anasarca within the abdominal wall. Musculoskeletal: No acute or significant osseous findings. IMPRESSION: 1. Moderate volume of ascites increased from prior CT and increased anasarca. 2. Omental and peritoneal carcinomatosis is mildly decreased. 3. Stable right adnexal mass measuring up to 5.9 cm. 4. Hepatic steatosis.  Cholelithiasis. 5. Small bilateral pleural effusions. 6. Large volume of stool in rectum, question constipation. Electronically Signed   By: Kristine Garbe M.D.   On: 12/02/2016 19:35   US Abdomen Limited  Result Date: 12/03/2016 CLINICAL DATA:  Ascites. EXAM: LIMITED ABDOMEN ULTRASOUND FOR ASCITES TECHNIQUE: Limited ultrasound survey for ascites was performed in all four abdominal quadrants. COMPARISON:  Abdomen and pelvis CT dated  12/02/2016. FINDINGS: Small moderate amount of free peritoneal fluid. IMPRESSION: Small to moderate amount of ascites. Electronically Signed   By: Claudie Revering M.D.   On: 12/03/2016 10:13    Scheduled Meds: . enoxaparin  85 mg Subcutaneous Q12H  . feeding supplement  1 Container Oral BID  . furosemide  20 mg Intravenous BID  . gabapentin  300 mg Oral QHS  . metoprolol succinate  25 mg Oral Daily  . pantoprazole  40 mg Oral Q0600  . polyethylene glycol  17 g Oral BID   Continuous Infusions: . albumin human    . ciprofloxacin Stopped (12/03/16 1403)     LOS: 1 day    Time spent: Total of 25 minutes spent with pt, greater than 50% of which was spent in discussion of  treatment, counseling and coordination of care    Chipper Oman, MD Pager: Text Page via [email or web address]  [phone]  If 7PM-7AM, please contact night-coverage [email or web address] Password [facility] 12/03/2016, 4:37 PM

## 2016-12-04 LAB — GLUCOSE, CAPILLARY
GLUCOSE-CAPILLARY: 98 mg/dL (ref 65–99)
GLUCOSE-CAPILLARY: 134 mg/dL — AB (ref 65–99)
Glucose-Capillary: 108 mg/dL — ABNORMAL HIGH (ref 65–99)
Glucose-Capillary: 119 mg/dL — ABNORMAL HIGH (ref 65–99)

## 2016-12-04 LAB — COMPREHENSIVE METABOLIC PANEL
ALT: 19 U/L (ref 14–54)
AST: 42 U/L — AB (ref 15–41)
Albumin: 2 g/dL — ABNORMAL LOW (ref 3.5–5.0)
Alkaline Phosphatase: 128 U/L — ABNORMAL HIGH (ref 38–126)
Anion gap: 4 — ABNORMAL LOW (ref 5–15)
BUN: 10 mg/dL (ref 6–20)
CHLORIDE: 99 mmol/L — AB (ref 101–111)
CO2: 34 mmol/L — AB (ref 22–32)
CREATININE: 0.53 mg/dL (ref 0.44–1.00)
Calcium: 7.6 mg/dL — ABNORMAL LOW (ref 8.9–10.3)
GFR calc non Af Amer: >60 mL/min (ref >60)
Glucose, Bld: 129 mg/dL — ABNORMAL HIGH (ref 65–99)
Potassium: 3.1 mmol/L — ABNORMAL LOW (ref 3.5–5.1)
SODIUM: 137 mmol/L (ref 135–145)
Total Bilirubin: 0.3 mg/dL (ref 0.3–1.2)
Total Protein: 4.4 g/dL — ABNORMAL LOW (ref 6.5–8.1)

## 2016-12-04 LAB — CBC WITH DIFFERENTIAL/PLATELET
BASOS PCT: 0 %
Basophils Absolute: 0 10*3/uL (ref 0.0–0.1)
EOS PCT: 1 %
Eosinophils Absolute: 0.1 10*3/uL (ref 0.0–0.7)
HEMATOCRIT: 26.5 % — AB (ref 36.0–46.0)
HEMOGLOBIN: 8.8 g/dL — AB (ref 12.0–15.0)
Lymphocytes Relative: 8 %
Lymphs Abs: 1 10*3/uL (ref 0.7–4.0)
MCH: 29.8 pg (ref 26.0–34.0)
MCHC: 33.2 g/dL (ref 30.0–36.0)
MCV: 89.8 fL (ref 78.0–100.0)
MONO ABS: 0.5 10*3/uL (ref 0.1–1.0)
Monocytes Relative: 4 %
NEUTROS PCT: 87 %
Neutro Abs: 11.2 10*3/uL — ABNORMAL HIGH (ref 1.7–7.7)
Platelets: 194 10*3/uL (ref 150–400)
RBC: 2.95 MIL/uL — ABNORMAL LOW (ref 3.87–5.11)
RDW: 15 % (ref 11.5–15.5)
WBC MORPHOLOGY: INCREASED
WBC: 12.8 10*3/uL — ABNORMAL HIGH (ref 4.0–10.5)

## 2016-12-04 MED ORDER — ALUM & MAG HYDROXIDE-SIMETH 200-200-20 MG/5ML PO SUSP: 15.0000 mL | Freq: Once | ORAL | Status: DC

## 2016-12-04 MED ORDER — BOOST / RESOURCE BREEZE PO LIQD
1.0000 | Freq: Three times a day (TID) | ORAL | Status: DC
  Administered 2016-12-05 – 2016-12-07 (×3): 1 via ORAL

## 2016-12-04 MED ORDER — FUROSEMIDE 10 MG/ML IJ SOLN
40.0000 mg | Freq: Two times a day (BID) | INTRAMUSCULAR | Status: DC
  Administered 2016-12-04 – 2016-12-07 (×7): 40 mg via INTRAVENOUS
  Filled 2016-12-04 (×7): qty 4

## 2016-12-04 MED ORDER — ALBUMIN HUMAN 5 % IV SOLN
12.5000 g | Freq: Once | INTRAVENOUS | Status: AC
Start: 2016-12-04 — End: 2016-12-04
  Administered 2016-12-04: 12.5 g via INTRAVENOUS
  Filled 2016-12-04: qty 250

## 2016-12-04 MED ORDER — POTASSIUM CHLORIDE 10 MEQ/100ML IV SOLN
10.0000 meq | INTRAVENOUS | Status: AC
  Administered 2016-12-04 (×3): 10 meq via INTRAVENOUS
  Filled 2016-12-04 (×3): qty 100

## 2016-12-04 NOTE — Progress Notes (Signed)
PT Cancellation Note  Patient Details Name: Annette Farmer MRN: [id number] DOB: 16-Jan-1946   Cancelled Treatment:     PT eval attempted but deferred.  Pt initially agreeable to participate but on return to room ~10 min later stated with a smile "I've just shit all over myself".  Nursing alerted.  Pt on bedpan on final attempt for PT eval.   Agnes Probert 12/04/2016, 3:47 PM

## 2016-12-04 NOTE — Progress Notes (Signed)
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 Patient was admitted for possible SBP was placed on antibiotics and IR was consulted for paracentesis. Abdominal ultrasound was performed but there was not a safe window to do paracentesis in this moment. Will continue with Abx and attempt paracentesis tomorrow.   Subjective: Patient seen and examined doing well, only c/o of abdominal distention but mild improved from yesterday.   Assessment & Plan: Peritoneal carcinomatosis - with ascites.  Suspected SBP - Given increase in abdominal girth, increasing ascites on CT abd and elevated white count.  Continue empiric antibiotic with IV ciprofloxacin. Paracentesis was ordered but ultrasound did not reveal a safe window for procedure Patient was started on neoadjuvant chemotherapy on 11/17/2016. Will discuss w medical oncologist in AM  Will discuss with IR if can re-evaluated for paracentesis again   Anasarca Likely to be cancer related with hypoalbuminemia Received albumin with Lasix with some improvement - will repeat another dose of Albumin, continue IV Lasix  Strict I&O's   Ovarian cancer stage III See above  Right lower extremity DVT Patient Lovenox at home, we'll continue therapeutic dose.  Hold Lovenox tonight in view of possible paracentesis in AM   Severe protein caloric malnutrition Due to overlying cancer Nutrition consulted  DVT prophylaxis: Lovenox sq  Code Status: DNR Family Communication: None at bedside  Disposition Plan: SNF when medically stable  Consultants:   IR    Procedures:   None   Antimicrobials: Anti-infectives    Start     Dose/Rate Route Frequency Ordered Stop   12/03/16 0000  ciprofloxacin (CIPRO) IVPB 400 mg     400 mg 200 mL/hr over 60 Minutes Intravenous Every 12 hours 12/02/16 2352     12/02/16 2200  ciprofloxacin (CIPRO) IVPB 500 mg  Status:  Discontinued     500 mg 250 mL/hr over 60 Minutes Intravenous Every 12 hours 12/02/16 2138 12/02/16 2352      Objective: Vitals:   12/03/16 1737 12/03/16 2023 12/04/16 0614 12/04/16 1515  BP: (!) 125/92 123/74 104/84 117/75  Pulse: 70 73 73 78  Resp: 16 14 16 18   Temp: 98.3 F (36.8 C) 97.9 F (36.6 C) 98 F (36.7 C) 98.1 F (36.7 C)  TempSrc: Oral Oral Oral Oral  SpO2: 97% 95% 98% 96%  Weight:      Height:        Intake/Output Summary (Last 24 hours) at 12/04/16 1634 Last data filed at 12/04/16 1555  Gross per 24 hour  Intake             1550 ml  Output             3450 ml  Net            -1900 ml   Filed Weights   12/03/16 0004  Weight: 92.4 kg (203 lb 11.3 oz)    Examination:  General: Pt is alert, awake, not in acute distress, Anasarca improving.  Cardiovascular: RRR, S1/S2 +, no rubs, no gallops Respiratory: CTA bilaterally, no wheezing, no rhonchi Abdominal: Obese, Abdominal wall edema has decreased, +BS, + fluid wave  Extremities: LE edema 1+ improving.  Data Reviewed: I have personally reviewed following labs and imaging studies  CBC:  Recent Labs Lab 11/28/16 0352 11/29/16 0813 12/02/16 1628 12/03/16 0821 12/04/16 0430  WBC 3.2* 13.9* 24.9* 17.0* 12.8*  NEUTROABS 1.5* 10.9* 23.2*  --  11.2*  HGB 9.5* 9.7* 9.9* 9.5* 8.8*  HCT 28.2* 28.8* 29.6* 29.1* 26.5*  MCV 86.0 87.3 89.4 90.4 89.8  PLT 168 187 207 203 503   Basic Metabolic Panel:  Recent Labs Lab 12/02/16 1628 12/03/16 0821 12/04/16 0430  NA 135 136 137  K 3.3* 3.2* 3.1*  CL 100* 100* 99*  CO2 31 33* 34*  GLUCOSE 170* 106* 129*  BUN 12 11 10   CREATININE 0.62 0.58 0.53   CALCIUM 7.5* 7.5* 7.6*   GFR: Estimated Creatinine Clearance: 66.6 mL/min (by C-G formula based on SCr of 0.53 mg/dL). Liver Function Tests:  Recent Labs Lab 12/02/16 1628 12/03/16 0821 12/04/16 0430  AST 43* 40 42*  ALT 18 18 19   ALKPHOS 168* 154* 128*  BILITOT 0.1* 0.5 0.3  PROT 4.6* 4.4* 4.4*  ALBUMIN 1.8* 1.8* 2.0*    Recent Labs Lab 12/02/16 1628  LIPASE 11   No results for input(s): AMMONIA in the last 168 hours. Coagulation Profile: No results for input(s): INR, PROTIME in the last 168 hours. Cardiac Enzymes: No results for input(s): CKTOTAL, CKMB, CKMBINDEX, TROPONINI in the last 168 hours. BNP (last 3 results) No results for input(s): PROBNP in the last 8760 hours. HbA1C: No results for input(s): HGBA1C in the last 72 hours. CBG:  Recent Labs Lab 12/03/16 1214 12/03/16 1756 12/03/16 2001 12/04/16 0808 12/04/16 1212  GLUCAP 126* 111* 101* 108* 98   Lipid Profile: No results for input(s): CHOL, HDL, LDLCALC, TRIG, CHOLHDL, LDLDIRECT in the last 72 hours. Thyroid Function Tests: No results for input(s): TSH, T4TOTAL, FREET4, T3FREE, THYROIDAB in the last 72 hours. Anemia Panel: No results for input(s): VITAMINB12, FOLATE, FERRITIN, TIBC, IRON, RETICCTPCT in the last 72 hours. Sepsis Labs: No results for input(s): PROCALCITON, LATICACIDVEN in the last 168 hours.  No results found for this or any previous visit (from the past 240 hour(s)).    Radiology Studies: Ct Abdomen Pelvis W Contrast  Result Date: 12/02/2016 CLINICAL DATA:  71 y/o F; r/o sbo, Hx of ovarian CA, now w n/v, abd discomfort. EXAM: CT ABDOMEN AND PELVIS WITH CONTRAST TECHNIQUE: Multidetector CT imaging of the abdomen and pelvis was performed using the standard protocol following bolus administration of intravenous contrast. CONTRAST:  179mL ISOVUE-300 IOPAMIDOL (ISOVUE-300) INJECTION 61% COMPARISON:  11/12/2014 CT abdomen and pelvis. FINDINGS: Lower chest: Coronary artery  calcifications. Small bilateral pleural effusions. Hepatobiliary: Hepatic steatosis. Cholelithiasis. No intra or extrahepatic biliary ductal dilatation. Pancreas: Unremarkable. No pancreatic ductal dilatation or surrounding inflammatory changes. Spleen: Normal in size without focal abnormality. Adrenals/Urinary Tract: Adrenal glands are unremarkable. Kidneys are normal, without renal calculi, focal lesion, or hydronephrosis. Bladder is unremarkable. Stomach/Bowel: No obstructive or inflammatory changes of bowel. Large volume of stool in rectum, question constipation. Vascular/Lymphatic: Aortic atherosclerosis. No enlarged abdominal or pelvic lymph nodes. Reproductive: Stable 4 cm left ovarian cyst. Stable right adnexal mass measuring 59 x 51 x 36 mm (AP x ML x CC series 2, image 71 and series 3, image 62). Other: Moderate volume of peritoneal ascites is increased from prior CT of abdomen and pelvis. Nodular thickening within the omentum is decreased from the prior CT. There is persistent peritoneal carcinomatosis within omentum and pericolic gutters with small foci of enhancement. Increased anasarca within  the abdominal wall. Musculoskeletal: No acute or significant osseous findings. IMPRESSION: 1. Moderate volume of ascites increased from prior CT and increased anasarca. 2. Omental and peritoneal carcinomatosis is mildly decreased. 3. Stable right adnexal mass measuring up to 5.9 cm. 4. Hepatic steatosis.  Cholelithiasis. 5. Small bilateral pleural effusions. 6. Large volume of stool in rectum, question constipation. Electronically Signed   By: Kristine Garbe M.D.   On: 12/02/2016 19:35   US Abdomen Limited  Result Date: 12/03/2016 CLINICAL DATA:  Ascites. EXAM: LIMITED ABDOMEN ULTRASOUND FOR ASCITES TECHNIQUE: Limited ultrasound survey for ascites was performed in all four abdominal quadrants. COMPARISON:  Abdomen and pelvis CT dated 12/02/2016. FINDINGS: Small moderate amount of free peritoneal  fluid. IMPRESSION: Small to moderate amount of ascites. Electronically Signed   By: Claudie Revering M.D.   On: 12/03/2016 10:13    Scheduled Meds: . enoxaparin  85 mg Subcutaneous Q12H  . feeding supplement  1 Container Oral TID WC  . furosemide  40 mg Intravenous BID  . gabapentin  300 mg Oral QHS  . metoprolol succinate  25 mg Oral Daily  . pantoprazole  40 mg Oral Q0600   Continuous Infusions: . ciprofloxacin Stopped (12/04/16 1340)     LOS: 2 days    Time spent: Total of 15 minutes spent with pt, greater than 50% of which was spent in discussion of  treatment, counseling and coordination of care   Chipper Oman, MD Pager: Text Page via [email or web address]  [phone]  If 7PM-7AM, please contact night-coverage [email or web address] Password [facility] 12/04/2016, 4:34 PM

## 2016-12-04 NOTE — Progress Notes (Signed)
Initial Nutrition Assessment  DOCUMENTATION CODES:   Non-severe (moderate) malnutrition in context of acute illness/injury, Obesity unspecified  INTERVENTION:   Continue Boost Breeze po TID, each supplement provides 250 kcal and 9 grams of protein Encourage PO intake RD will continue to monitor for needs  NUTRITION DIAGNOSIS:   Malnutrition (Moderate) related to acute illness (peritonitis) as evidenced by energy intake < or equal to 75% for > or equal to 1 month, mild depletion of muscle mass.  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 Patient was admitted for possible SBP was placed on antibiotics and IR was consulted for paracentesis. Abdominal ultrasound was performed but there was not a safe window to do paracentesis in this moment. Will continue with Abx and attempt paracentesis tomorrow.   Patient familiar to RD from previous admission. Patient was having N/V PTA. Now consuming 100% of meals, except she does not eat breakfast,so nothing this morning.  She is drinking Boost Breeze supplements, will increase to TID.   No changes in weight status but pt with ascites and anasarca which is masking her actual weight status. NFPE showing minimal results d/t edema. Some mild-moderate depletion noted in clavicle region.   Medications: Iv Lasix BID, Prtotonix tablet daily, Maalox PRN, IV Zofran PRN  Labs reviewed: CBGs: 108 Low K  Diet Order:  DIET SOFT Room service appropriate? Yes; Fluid  consistency: Thin  Skin:  Reviewed, no issues  Last BM:  7/13  Height:   Ht Readings from Last 1 Encounters:  12/03/16 5' 1.5" (1.562 m)    Weight:   Wt Readings from Last 1 Encounters:  12/03/16 203 lb 11.3 oz (92.4 kg)    Ideal Body Weight:  50 kg  BMI:  Body mass index is 37.87 kg/m.  Estimated Nutritional Needs:   Kcal:  1600-1800  Protein:  60-70g  Fluid:  1.5L/day  EDUCATION NEEDS:   No education needs identified at this time  Clayton Bibles, MS, RD, LDN Pager: [phone] After Hours Pager: [phone]

## 2016-12-05 ENCOUNTER — Other Ambulatory Visit: Payer: Self-pay | Admitting: Hematology and Oncology

## 2016-12-05 ENCOUNTER — Inpatient Hospital Stay (HOSPITAL_COMMUNITY): Payer: Medicare PPO

## 2016-12-05 DIAGNOSIS — E440 Moderate protein-calorie malnutrition: Secondary | ICD-10-CM

## 2016-12-05 DIAGNOSIS — R1084 Generalized abdominal pain: Secondary | ICD-10-CM

## 2016-12-05 DIAGNOSIS — E876 Hypokalemia: Secondary | ICD-10-CM

## 2016-12-05 DIAGNOSIS — Z86718 Personal history of other venous thrombosis and embolism: Secondary | ICD-10-CM

## 2016-12-05 DIAGNOSIS — K5900 Constipation, unspecified: Secondary | ICD-10-CM

## 2016-12-05 DIAGNOSIS — Z7901 Long term (current) use of anticoagulants: Secondary | ICD-10-CM

## 2016-12-05 DIAGNOSIS — C786 Secondary malignant neoplasm of retroperitoneum and peritoneum: Secondary | ICD-10-CM

## 2016-12-05 DIAGNOSIS — D649 Anemia, unspecified: Secondary | ICD-10-CM

## 2016-12-05 LAB — BASIC METABOLIC PANEL
ANION GAP: 4 — AB (ref 5–15)
BUN: 8 mg/dL (ref 6–20)
CALCIUM: 7.5 mg/dL — AB (ref 8.9–10.3)
CO2: 36 mmol/L — ABNORMAL HIGH (ref 22–32)
Chloride: 93 mmol/L — ABNORMAL LOW (ref 101–111)
Creatinine, Ser: 0.54 mg/dL (ref 0.44–1.00)
Glucose, Bld: 110 mg/dL — ABNORMAL HIGH (ref 65–99)
POTASSIUM: 3.1 mmol/L — AB (ref 3.5–5.1)
SODIUM: 133 mmol/L — AB (ref 135–145)

## 2016-12-05 LAB — CBC
HCT: 25.7 % — ABNORMAL LOW (ref 36.0–46.0)
Hemoglobin: 8.4 g/dL — ABNORMAL LOW (ref 12.0–15.0)
MCH: 29 pg (ref 26.0–34.0)
MCHC: 32.7 g/dL (ref 30.0–36.0)
MCV: 88.6 fL (ref 78.0–100.0)
PLATELETS: 190 10*3/uL (ref 150–400)
RBC: 2.9 MIL/uL — AB (ref 3.87–5.11)
RDW: 14.8 % (ref 11.5–15.5)
WBC: 9.3 10*3/uL (ref 4.0–10.5)

## 2016-12-05 LAB — MAGNESIUM: MAGNESIUM: 1.5 mg/dL — AB (ref 1.7–2.4)

## 2016-12-05 LAB — ALBUMIN: ALBUMIN: 2 g/dL — AB (ref 3.5–5.0)

## 2016-12-05 MED ORDER — ALBUMIN HUMAN 25 % IV SOLN
12.5000 g | Freq: Once | INTRAVENOUS | Status: AC
  Administered 2016-12-05: 12.5 g via INTRAVENOUS
  Filled 2016-12-05 (×2): qty 50

## 2016-12-05 MED ORDER — MAGNESIUM SULFATE 2 GM/50ML IV SOLN
2.0000 g | Freq: Once | INTRAVENOUS | Status: AC
  Administered 2016-12-05: 2 g via INTRAVENOUS
  Filled 2016-12-05: qty 50

## 2016-12-05 MED ORDER — SENNOSIDES-DOCUSATE SODIUM 8.6-50 MG PO TABS
2.0000 | ORAL_TABLET | Freq: Two times a day (BID) | ORAL | Status: DC
  Administered 2016-12-05 – 2016-12-07 (×5): 2 via ORAL
  Filled 2016-12-05 (×5): qty 2

## 2016-12-05 MED ORDER — SODIUM CHLORIDE 0.9% FLUSH: 10.0000 mL | INTRAVENOUS | Status: DC | PRN

## 2016-12-05 MED ORDER — POLYETHYLENE GLYCOL 3350 17 G PO PACK
17.0000 g | PACK | Freq: Every day | ORAL | Status: DC
  Administered 2016-12-05 – 2016-12-07 (×3): 17 g via ORAL
  Filled 2016-12-05 (×3): qty 1

## 2016-12-05 MED ORDER — CEFTRIAXONE SODIUM 2 G IJ SOLR
2.0000 g | INTRAMUSCULAR | Status: DC
  Administered 2016-12-05 – 2016-12-07 (×3): 2 g via INTRAVENOUS
  Filled 2016-12-05 (×3): qty 2

## 2016-12-05 MED ORDER — POTASSIUM CHLORIDE 10 MEQ/100ML IV SOLN
10.0000 meq | INTRAVENOUS | Status: AC
  Administered 2016-12-05 (×4): 10 meq via INTRAVENOUS
  Filled 2016-12-05 (×4): qty 100

## 2016-12-05 NOTE — Progress Notes (Signed)
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 Patient was admitted for possible SBP was placed on antibiotics and IR was consulted for paracentesis. Abdominal ultrasound was performed but there was not a safe window to do paracentesis in this moment. Will continue with Abx and attempt paracentesis tomorrow.   Subjective: Patient seen and examined, she is doing much better today report her swelling has decreased significantly. Her belly feels less distended. She reported that she's feeling hungry and able to eat better. Ultrasound was performed which showed decrease in amount of free peritoneal fluid.  Assessment & Plan: Peritoneal carcinomatosis - with ascites. Suspected SBP - Given increase in abdominal girth, increasing ascites on CT abd and elevated white count. - Improving Initially treated with Cipro,changed to Rocephin. Ascites significantly improved  Paracentesis was ordered but ultrasound did not reveal a safe window for procedure Patient was started on neoadjuvant chemotherapy on 11/17/2016. She had round 1 of chemo, per oncology team, will have 2nd round of chemo prior to d/c in next 1-2 days  Anasarca - Improving  Likely to be cancer related with hypoalbuminemia Patient has receive 2 doses of albumin with good response, will give another dose today and continue IV Lasix, having good diuresis. Negative 4.3 L from yesterday  Continue to monitor I&Os  Monitor BMP in AM   Ovarian cancer stage III See above Oncology recommendations appreciated   Right lower extremity DVT Cancer related   Will continue therapeutic Lovenox for now   Hypokalemia/Hypomagnesemia  Likely from Lasix and poor nutrition  Replete  Check in AM   Anemia of malignancy  Oncology following  No signs of overt bleeding  Transfuse if Hgb < 7   Severe protein caloric malnutrition Due to overlying cancer Nutrition consulted  DVT prophylaxis: Lovenox sq  Code Status: DNR Family Communication: None at bedside  Disposition Plan: SNF when medically stable  Consultants:   IR   Procedures:   None   Antimicrobials: Anti-infectives    Start     Dose/Rate Route Frequency Ordered Stop   12/05/16 1200  cefTRIAXone (ROCEPHIN) 2 g in dextrose 5 % 50 mL IVPB     2 g 100 mL/hr over 30 Minutes Intravenous Every 24 hours 12/05/16 1110     12/03/16 0000  ciprofloxacin (CIPRO) IVPB 400 mg  Status:  Discontinued     400 mg 200 mL/hr over 60 Minutes Intravenous Every 12 hours 12/02/16 2352 12/05/16 1110   12/02/16 2200  ciprofloxacin (CIPRO) IVPB 500 mg  Status:  Discontinued     500 mg 250 mL/hr over 60 Minutes Intravenous Every 12 hours 12/02/16 2138 12/02/16 2352      Objective: Vitals:   12/04/16 2105 12/04/16 2128 12/04/16 2250 12/05/16 0433  BP: 107/68 122/86 135/78 132/68  Pulse: 76 75 77 76  Resp: 20 20 20 18   Temp: 98.7 F (37.1 C) 98.4 F (36.9 C) 98.3 F (36.8 C) 98.4 F (36.9 C)  TempSrc: Oral Oral Oral Oral  SpO2: 95% 97% 96% 96%  Weight:      Height:        Intake/Output Summary (Last 24 hours) at 12/05/16 1222 Last data  filed at 12/05/16 0547  Gross per 24 hour  Intake              320 ml  Output             3800 ml  Net            -3480 ml   Filed Weights   12/03/16 0004  Weight: 92.4 kg (203 lb 11.3 oz)    Examination:  General: Pt is alert, awake, not in acute distress, Anasarca continues to improve  Cardiovascular: RRR, S1/S2 +, no rubs, no gallops Respiratory: CTA bilaterally, no wheezing, no rhonchi Abdominal: Obese, abdominal wall less edematous, NT bowel  sounds + Extremities: LE edema continues to improve   Data Reviewed: I have personally reviewed following labs and imaging studies  CBC:  Recent Labs Lab 11/29/16 0813 12/02/16 1628 12/03/16 0821 12/04/16 0430 12/05/16 0500  WBC 13.9* 24.9* 17.0* 12.8* 9.3  NEUTROABS 10.9* 23.2*  --  11.2*  --   HGB 9.7* 9.9* 9.5* 8.8* 8.4*  HCT 28.8* 29.6* 29.1* 26.5* 25.7*  MCV 87.3 89.4 90.4 89.8 88.6  PLT 187 207 203 194 009   Basic Metabolic Panel:  Recent Labs Lab 12/02/16 1628 12/03/16 0821 12/04/16 0430 12/05/16 0500  NA 135 136 137 133*  K 3.3* 3.2* 3.1* 3.1*  CL 100* 100* 99* 93*  CO2 31 33* 34* 36*  GLUCOSE 170* 106* 129* 110*  BUN 12 11 10 8   CREATININE 0.62 0.58 0.53 0.54  CALCIUM 7.5* 7.5* 7.6* 7.5*  MG  --   --   --  1.5*   GFR: Estimated Creatinine Clearance: 66.6 mL/min (by C-G formula based on SCr of 0.54 mg/dL). Liver Function Tests:  Recent Labs Lab 12/02/16 1628 12/03/16 0821 12/04/16 0430 12/05/16 0500  AST 43* 40 42*  --   ALT 18 18 19   --   ALKPHOS 168* 154* 128*  --   BILITOT 0.1* 0.5 0.3  --   PROT 4.6* 4.4* 4.4*  --   ALBUMIN 1.8* 1.8* 2.0* 2.0*    Recent Labs Lab 12/02/16 1628  LIPASE 11   No results for input(s): AMMONIA in the last 168 hours. Coagulation Profile: No results for input(s): INR, PROTIME in the last 168 hours. Cardiac Enzymes: No results for input(s): CKTOTAL, CKMB, CKMBINDEX, TROPONINI in the last 168 hours. BNP (last 3 results) No results for input(s): PROBNP in the last 8760 hours. HbA1C: No results for input(s): HGBA1C in the last 72 hours. CBG:  Recent Labs Lab 12/03/16 2001 12/04/16 0808 12/04/16 1212 12/04/16 1657 12/04/16 1945  GLUCAP 101* 108* 98 134* 119*   Lipid Profile: No results for input(s): CHOL, HDL, LDLCALC, TRIG, CHOLHDL, LDLDIRECT in the last 72 hours. Thyroid Function Tests: No results for input(s): TSH, T4TOTAL, FREET4, T3FREE, THYROIDAB in the last 72 hours. Anemia Panel: No  results for input(s): VITAMINB12, FOLATE, FERRITIN, TIBC, IRON, RETICCTPCT in the last 72 hours. Sepsis Labs: No results for input(s): PROCALCITON, LATICACIDVEN in the last 168 hours.  No results found for this or any previous visit (from the past 240 hour(s)).    Radiology Studies: US Abdomen Limited  Result Date: 12/05/2016 CLINICAL DATA:  Evaluate for abdominal ascites and perform ultrasound-guided paracentesis as indicated. EXAM: LIMITED ABDOMEN ULTRASOUND FOR ASCITES TECHNIQUE: Limited ultrasound survey for ascites was performed in all four abdominal quadrants. COMPARISON:  Ascites search ultrasound - 12/03/2016; CT abdomen pelvis - 12/02/2016 FINDINGS: Four quadrant grayscale images are provided of the abdomen  and demonstrates only a trace to small amount of peritoneal fluid with largest collection with the left upper abdominal quadrant, similar to abdominal CT performed 12/02/2016 and too small to allow for safe ultrasound-guided paracentesis. IMPRESSION: Trace to small amount of intra-abdominal ascites, too small to allow for safe ultrasound-guided paracentesis. Electronically Signed   By: Sandi Mariscal M.D.   On: 12/05/2016 10:59    Scheduled Meds: . enoxaparin  85 mg Subcutaneous Q12H  . feeding supplement  1 Container Oral TID WC  . furosemide  40 mg Intravenous BID  . gabapentin  300 mg Oral QHS  . metoprolol succinate  25 mg Oral Daily  . pantoprazole  40 mg Oral Q0600  . polyethylene glycol  17 g Oral Daily  . senna-docusate  2 tablet Oral BID   Continuous Infusions: . cefTRIAXone (ROCEPHIN)  IV    . potassium chloride Stopped (12/05/16 1059)     LOS: 3 days    Time spent: Total of 15 minutes spent with pt, greater than 50% of which was spent in discussion of  treatment, counseling and coordination of care   Chipper Oman, MD Pager: Text Page via [email or web address]  [phone]  If 7PM-7AM, please contact night-coverage [email or web address] Password [facility] 12/05/2016, 12:22 PM

## 2016-12-05 NOTE — Clinical Social Work Note (Signed)
Clinical Social Work Assessment  Patient Details  Name: GREENLY RARICK MRN: [id number] Date of Birth: 1945/08/15  Date of referral:  12/05/16               Reason for consult:  Facility Placement, Discharge Planning                Permission sought to share information with:  Facility Art therapist granted to share information::  Yes, Verbal Permission Granted  Name::        Agency::     Relationship::     Contact Information:     Housing/Transportation Living arrangements for the past 2 months:  [place], [place] of Information:  Patient Patient Interpreter Needed:  None Criminal Activity/Legal Involvement Pertinent to Current Situation/Hospitalization:  No - Comment as needed Significant Relationships:  Friend Lives with:  Self, Facility Resident Do you feel safe going back to the place where you live?  Yes (Requesting to return to SNF.) Need for family participation in patient care:  No (Coment)  Care giving concerns:  No family at bedside.   Social Worker assessment / plan: Pt hospitalized on 12/02/16 from [place] with ascitis and leukocytosis. Pt had been recently hospitalized at [facility] ( 11/10/16 - 11/29/16 ) and dc to [place] for ST Rehab. CSW met with pt at bedside to assist with d/c planning. Pt reports that she would like to return to [place]  at [employer]. Clinicals have been sent to SNF for review. VM also left for Admissions at SNF to confirm dc plan. Awaiting return call. CSW will continue to follow to assist with d/c planning needs.  Employment status:  Retired Nurse, adult PT Recommendations:  Not assessed at this time (PT evalpending.) Information / Referral to community resources:     Patient/Family's Response to care:  Pt would like to return to SNF to complete rehab.  Patient/Family's Understanding of and Emotional Response to Diagnosis, Current Treatment, and Prognosis:  "  I'm not feeling well." Pt reports that she was too sick at SNF to work on her rehab.   Emotional Assessment Appearance:  Appears stated age Attitude/Demeanor/Rapport:  Other (cooperative) Affect (typically observed):  Appropriate, Calm Orientation:  Oriented to Self, Oriented to Place, Oriented to  Time, Oriented to Situation Alcohol / Substance use:  Not Applicable Psych involvement (Current and /or in the community):  No (Comment)  Discharge Needs  Concerns to be addressed:  Discharge Planning Concerns Readmission within the last 30 days:  Yes Current discharge risk:  None Barriers to Discharge:  No Barriers Identified   Luretha Rued, [place] 12/05/2016, 12:22 PM

## 2016-12-05 NOTE — Progress Notes (Signed)
CSW assisting with dc planning. Annette Farmer has confirmed plan to readmit pt once medically stable and [employer] authorization has been received.   Werner Lean LCSW [phone]

## 2016-12-05 NOTE — Evaluation (Signed)
Physical Therapy Evaluation Patient Details Name: Annette Farmer MRN: [id number] DOB: 01/21/1946 Today's Date: 12/05/2016   History of Present Illness  Pt is a 71 y.o. female with medical history significant of HTN, DM type II, CVA, and depression. Recent admission 6/21-7/10/18 for right leg swelling.  Ultrasound revealed extensive clot RLE. Abdominal CT revealed right adnexal tumor with carcinomatosis. DCed to SNF, now readmitted 12/02/16 with N/V, ascites, peritonitis.    Clinical Impression  Pt admitted with above diagnosis. Pt currently with functional limitations due to the deficits listed below (see PT Problem List). Mod assist for stand pivot transfers, pt too fatigued to attempt ambulation. She is significantly deconditioned due to several weeks of illness.  Pt will benefit from skilled PT to increase their independence and safety with mobility to allow discharge to the venue listed below.       Follow Up Recommendations SNF    Equipment Recommendations  None recommended by PT    Recommendations for Other Services       Precautions / Restrictions Precautions Precautions: Fall Precaution Comments: CHEMO, external catheter Restrictions Weight Bearing Restrictions: No      Mobility  Bed Mobility Overal bed mobility: Needs Assistance Bed Mobility: Sit to Supine     Supine to sit: Mod assist     General bed mobility comments: mod A to raise trunk  Transfers Overall transfer level: Needs assistance Equipment used: 2 person hand held assist Transfers: Sit to/from Stand Sit to Stand: Mod assist;+2 physical assistance;+2 safety/equipment Stand pivot transfers: Min assist;+2 physical assistance;+2 safety/equipment       General transfer comment: SPT bed to [facility] then to recliner wtih +2 HHA (pt doesn't like RW), VCs for hand placement  Ambulation/Gait                Stairs            Wheelchair Mobility    Modified Rankin (Stroke Patients Only)        Balance Overall balance assessment: Needs assistance   Sitting balance-Leahy Scale: Fair     Standing balance support: Bilateral upper extremity supported Standing balance-Leahy Scale: Poor Standing balance comment: Requires BUE support in standing                             Pertinent Vitals/Pain Pain Score: 2  Pain Location: abdomen Pain Descriptors / Indicators: Aching Pain Intervention(s): Monitored during session;Limited activity within patient's tolerance    Home Living Family/patient expects to be discharged to:: Skilled nursing facility Living Arrangements: Alone Available Help at Discharge: Friend(s);Available PRN/intermittently Type of Home: House Home Access: Stairs to enter Entrance Stairs-Rails: Psychiatric nurse of Steps: 4 Home Layout: One level Home Equipment: Walker - 2 wheels;Cane - single point;Shower seat      Prior Function Level of Independence: Independent with assistive device(s)         Comments: Pt used cane for ambulation. Still drives. Mainly eats out at restaurants or fast food. Since recent admission, pt was only performing transfers at [facility].      Hand Dominance        Extremity/Trunk Assessment   Upper Extremity Assessment Upper Extremity Assessment: Generalized weakness    Lower Extremity Assessment Lower Extremity Assessment: Generalized weakness (edema RLE, knee ext -4/5 B)    Cervical / Trunk Assessment Cervical / Trunk Assessment: Normal  Communication   Communication: No difficulties  Cognition Arousal/Alertness: Awake/alert Behavior During Therapy: WFL for tasks  assessed/performed Overall Cognitive Status: Within Functional Limits for tasks assessed                           Safety/Judgement: Decreased awareness of safety     General Comments: requires encouragement      General Comments      Exercises General Exercises - Lower Extremity Ankle Circles/Pumps:  AROM;Both;10 reps;Seated Heel Slides: AAROM;Both;10 reps;Supine Hip ABduction/ADduction: AAROM;Both;10 reps;Supine   Assessment/Plan    PT Assessment Patient needs continued PT services  PT Problem List Decreased strength;Decreased mobility;Decreased activity tolerance;Decreased balance;Decreased knowledge of use of DME;Pain;Decreased safety awareness       PT Treatment Interventions DME instruction;Gait training;Stair training;Functional mobility training;Balance training;Therapeutic exercise;Therapeutic activities;Patient/family education    PT Goals (Current goals can be found in the Care Plan section)  Acute Rehab PT Goals Patient Stated Goal: to walk, go home PT Goal Formulation: With patient Time For Goal Achievement: 12/19/16 Potential to Achieve Goals: Fair    Frequency Min 3X/week   Barriers to discharge Decreased caregiver support      Co-evaluation               AM-PAC PT "6 Clicks" Daily Activity  Outcome Measure Difficulty turning over in bed (including adjusting bedclothes, sheets and blankets)?: Total Difficulty moving from lying on back to sitting on the side of the bed? : Total Difficulty sitting down on and standing up from a chair with arms (e.g., wheelchair, bedside commode, etc,.)?: Total Help needed moving to and from a bed to chair (including a wheelchair)?: A Lot Help needed walking in hospital room?: A Lot Help needed climbing 3-5 steps with a railing? : Total 6 Click Score: 8    End of Session Equipment Utilized During Treatment: Gait belt Activity Tolerance: Patient limited by fatigue Patient left: with call bell/phone within reach;in chair;with family/visitor present Nurse Communication: Mobility status PT Visit Diagnosis: Muscle weakness (generalized) (M62.81);Difficulty in walking, not elsewhere classified (R26.2);Pain Pain - part of body:  (abdomen)    Time: 5830-9407 PT Time Calculation (min) (ACUTE ONLY): 24 min   Charges:   PT  Evaluation $PT Eval Moderate Complexity: 1 Procedure PT Treatments $Therapeutic Activity: 8-22 mins   PT G Codes:          Philomena Doheny 12/05/2016, 2:41 PM [phone]

## 2016-12-05 NOTE — Progress Notes (Signed)
[facility] NOTE  Patient Care Team: Wardell Honour, MD as PCP - General (Family Medicine)  CHIEF COMPLAINTS/PURPOSE OF CONSULTATION:  Ovarian cancer, severe abdominal pain  HISTORY OF PRESENTING ILLNESS:  Annette Farmer 71 y.o. female is admitted to the hospital for further management of abdominal symptoms. Please see my detailed dictation from last month for further details. 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  The patient was discharged to skilled nursing facility.  She was readmitted again after presentation with abdominal pain, nausea and inability to keep food down.  Summary of oncologic history as follows:   Ovarian cancer on right [facility]): Stage 3C   11/10/2016 Imaging    1. No pulmonary embolus. 2. Extensive anterior peritoneal nodular implants, consistent with carcinomatosis. Perihepatic and perisplenic ascites is likely malignant. 3. Lobulated right pelvic mass arising from either the right adnexa or the uterine fundus. This may be the primary malignancy. 4. Gallbladder filled stones but no other evidence of acute cholecystitis. 5. Multiple subcentimeter retroperitoneal nodes. In this context, these could indicate lymphatic spread of disease.      11/10/2016 - 11/29/2016 Hospital Admission    She was admitted to the hospital due to fall, DVT, ovarian cancer and debility      11/11/2016 Pathology Results    PERITONEAL/ASCITIC FLUID (SPECIMEN 1 OF 1, COLLECTED ON 11/11/2016): MALIGNANT CELLS CONSISTENT WITH ADENOCARCINOMA, SEE COMMENT.      11/11/2016 Tumor Marker    Patient's tumor was tested for the following markers: CA-125 Results of the tumor marker test revealed 967.8      11/17/2016 -  Chemotherapy    She received carboplatin & Taxol      11/20/2016 Imaging    Not enough fluid  for paracentesis      11/25/2016 Procedure    Placement of a subcutaneous port device      12/02/2016 Imaging    1. Moderate volume of ascites increased from prior CT and increased anasarca. 2. Omental and peritoneal carcinomatosis is mildly decreased. 3. Stable right adnexal mass measuring up to 5.9 cm. 4. Hepatic steatosis.  Cholelithiasis. 5. Small bilateral pleural effusions. 6. Large volume of stool in rectum, question constipation.       12/03/2016 Imaging    US abdomen: Small to moderate amount of ascites      She stated that she had passage of liquid stool on and off.  Since admission to the hospital, her symptoms has improved She has intermittent abdominal pain in the right lower quadrant.  She has poor oral intake even from prior hospitalization, worse since recent discharge from the hospital. She has not reported any recent falls. The patient denies any recent signs or symptoms of bleeding such as spontaneous epistaxis, hematuria or hematochezia.   MEDICAL HISTORY:  Past Medical History:  Diagnosis Date  . Depression   . Diabetes mellitus without complication ([place])   . Hypertension   . Stroke [facility])     SURGICAL HISTORY: Past Surgical History:  Procedure Laterality Date  . HERNIA REPAIR    . IR FLUORO GUIDE PORT INSERTION LEFT  11/25/2016  . IR PARACENTESIS  11/11/2016  . IR US GUIDE VASC ACCESS LEFT  11/25/2016  . UMBILICAL HERNIA REPAIR      SOCIAL HISTORY: Social History   Social History  . Marital status: Single  Spouse name: N/A  . Number of children: N/A  . Years of education: N/A   Occupational History  . Not on file.   Social History Main Topics  . Smoking status: Never Smoker  . Smokeless tobacco: Never Used  . Alcohol use No  . Drug use: No  . Sexual activity: No   Other Topics Concern  . Not on file   Social History Narrative   Marital status: single; not dating      Children: none      Live: alone      Employment: retired in  1997; Corporate treasurer      Tobacco: never      Alcohol: drinks "a little" 3 bottles of liquor and 3 bottles of wine      Exercise: none in 2018    FAMILY HISTORY: Family History  Problem Relation Age of Onset  . Parkinson's disease Mother   . Dementia Father   . Cancer Sister 27       Breast cancer    ALLERGIES:  is allergic to penicillins.  MEDICATIONS:  Current Facility-Administered Medications  Medication Dose Route Frequency Provider Last Rate Last Dose  . acetaminophen (TYLENOL) tablet 650 mg  650 mg Oral Q6H PRN Holwerda, Scott, MD      . albuterol (PROVENTIL) (2.5 MG/3ML) 0.083% nebulizer solution 2.5 mg  2.5 mg Nebulization Q6H PRN Velna Hatchet, MD      . alum & mag hydroxide-simeth (MAALOX/MYLANTA) 200-200-20 MG/5ML suspension 30 mL  30 mL Oral Q4H PRN Velna Hatchet, MD   30 mL at 12/04/16 2139  . ciprofloxacin (CIPRO) IVPB 400 mg  400 mg Intravenous Q12H Velna Hatchet, MD   Stopped at 12/05/16 0041  . enoxaparin (LOVENOX) injection 85 mg  85 mg Subcutaneous Q12H Velna Hatchet, MD   Stopped at 12/05/16 0000  . feeding supplement (BOOST / RESOURCE BREEZE) liquid 1 Container  1 Container Oral TID WC Patrecia Pour, Christean Grief, MD      . furosemide (LASIX) injection 40 mg  40 mg Intravenous BID Patrecia Pour, Christean Grief, MD   40 mg at 12/04/16 1826  . gabapentin (NEURONTIN) capsule 300 mg  300 mg Oral QHS Velna Hatchet, MD   300 mg at 12/04/16 2342  . magnesium sulfate IVPB 2 g 50 mL  2 g Intravenous Once Patrecia Pour, Christean Grief, MD      . metoprolol succinate (TOPROL-XL) 24 hr tablet 25 mg  25 mg Oral Daily Velna Hatchet, MD   25 mg at 12/04/16 0842  . ondansetron (ZOFRAN) tablet 4 mg  4 mg Oral Q6H PRN Velna Hatchet, MD       Or  . ondansetron (ZOFRAN) injection 4 mg  4 mg Intravenous Q6H PRN Velna Hatchet, MD      . ondansetron (ZOFRAN) injection 4 mg  4 mg Intravenous Q6H PRN Opyd, Ilene Qua, MD   4 mg at 12/03/16 0027  . pantoprazole (PROTONIX) EC tablet 40 mg  40 mg  Oral Q0600 Velna Hatchet, MD   40 mg at 12/04/16 0617  . polyethylene glycol (MIRALAX / GLYCOLAX) packet 17 g  17 g Oral Daily Elsey Holts, MD      . potassium chloride 10 mEq in 100 mL IVPB  10 mEq Intravenous Q1 Hr x 4 Patrecia Pour, Edwin, MD      . senna-docusate (Senokot-S) tablet 2 tablet  2 tablet Oral BID Alvy Bimler, Amitai Delaughter, MD      . traMADol (ULTRAM) tablet 50 mg  50 mg  Oral Q6H PRN Velna Hatchet, MD   50 mg at 12/04/16 2342    REVIEW OF SYSTEMS:   Constitutional: Denies fevers, chills or abnormal night sweats Eyes: Denies blurriness of vision, double vision or watery eyes Ears, nose, mouth, throat, and face: Denies mucositis or sore throat Respiratory: Denies cough, dyspnea or wheezes Cardiovascular: Denies palpitation, chest discomfort or lower extremity swelling Skin: Denies abnormal skin rashes Lymphatics: Denies new lymphadenopathy or easy bruising Neurological:Denies numbness, tingling or new weaknesses Behavioral/Psych: Mood is stable, no new changes  All other systems were reviewed with the patient and are negative.  PHYSICAL EXAMINATION: ECOG PERFORMANCE STATUS: 2 - Symptomatic, <50% confined to bed  Vitals:   12/04/16 2250 12/05/16 0433  BP: 135/78 132/68  Pulse: 77 76  Resp: 20 18  Temp: 98.3 F (36.8 C) 98.4 F (36.9 C)   Filed Weights   12/03/16 0004  Weight: 203 lb 11.3 oz (92.4 kg)    GENERAL:alert, no distress and comfortable.  She looks debilitated, older than stated age SKIN: skin color, texture, turgor are normal, no rashes or significant lesions.  Noted anasarca and bruising EYES: normal, conjunctiva are pale and non-injected, sclera clear OROPHARYNX:no exudate, no erythema and lips, buccal mucosa, and tongue normal  NECK: supple, thyroid normal size, non-tender, without nodularity LYMPH:  no palpable lymphadenopathy in the cervical, axillary or inguinal LUNGS: clear to auscultation and percussion with normal breathing effort HEART: regular rate &  rhythm and no murmurs and no lower extremity edema ABDOMEN:abdomen soft, distended with mild right lower quadrant pain, active bowel sounds Musculoskeletal:no cyanosis of digits and no clubbing  PSYCH: alert & oriented x 3 with fluent speech NEURO: no focal motor/sensory deficits  LABORATORY DATA:  I have reviewed the data as listed Lab Results  Component Value Date   WBC 9.3 12/05/2016   HGB 8.4 (L) 12/05/2016   HCT 25.7 (L) 12/05/2016   MCV 88.6 12/05/2016   PLT 190 12/05/2016    Recent Labs  12/02/16 1628 12/03/16 0821 12/04/16 0430 12/05/16 0500  NA 135 136 137 133*  K 3.3* 3.2* 3.1* 3.1*  CL 100* 100* 99* 93*  CO2 31 33* 34* 36*  GLUCOSE 170* 106* 129* 110*  BUN 12 11 10 8   CREATININE 0.62 0.58 0.53 0.54  CALCIUM 7.5* 7.5* 7.6* 7.5*  GFRNONAA >60 >60 >60 >60  GFRAA >60 >60 >60 >60  PROT 4.6* 4.4* 4.4*  --   ALBUMIN 1.8* 1.8* 2.0* 2.0*  AST 43* 40 42*  --   ALT 18 18 19   --   ALKPHOS 168* 154* 128*  --   BILITOT 0.1* 0.5 0.3  --     RADIOGRAPHIC STUDIES: I have personally reviewed the radiological images as listed and agreed with the findings in the report. Dg Chest 2 View  Result Date: 11/10/2016 CLINICAL DATA:  Chest pain and right lower extremity DVT EXAM: CHEST  2 VIEW COMPARISON:  Chest radiograph 08/30/2015 FINDINGS: Shallow lung inflation without focal airspace disease or pulmonary edema. No pleural effusion or pneumothorax. Unchanged cardiomediastinal contours. IMPRESSION: No active cardiopulmonary disease. Electronically Signed   By: Ulyses Jarred M.D.   On: 11/10/2016 18:25   Dg Tibia/fibula Right  Result Date: 11/05/2016 CLINICAL DATA:  Swelling and pain in the right lower extremity after fall 1 week prior EXAM: RIGHT TIBIA AND FIBULA - 2 VIEW COMPARISON:  None. FINDINGS: Diffuse right lower extremity soft tissue swelling, most prominent lateral to the right knee. No fracture. No suspicious focal  osseous lesion. No evidence of malalignment at the right  knee or right ankle on these views. No radiopaque foreign body. IMPRESSION: Diffuse right lower extremity soft tissue swelling, most prominent lateral to the right knee. No fracture. No radiopaque foreign body. Electronically Signed   By: Ilona Sorrel M.D.   On: 11/05/2016 17:38   Dg Abd 1 View  Result Date: 11/18/2016 CLINICAL DATA:  Followup ileus or partial small bowel obstruction. Ovarian cancer. EXAM: ABDOMEN - 1 VIEW COMPARISON:  11/15/2016. FINDINGS: Normal bowel gas pattern.  Unremarkable bones. IMPRESSION: No acute abnormality. Electronically Signed   By: Claudie Revering M.D.   On: 11/18/2016 15:02   Dg Abd 1 View  Result Date: 11/15/2016 CLINICAL DATA:  Abdominal distention, back pain, history of SP of EXAM: ABDOMEN - 1 VIEW COMPARISON:  Abdomen film of 11/14/2016 and CT abdomen pelvis of 11/10/2016 FINDINGS: Compared to the most recent KUB, there is less gaseous distention of large and small bowel. There is some gaseous distention of the stomach. No small bowel obstruction is evident. A moderate amount of feces is noted in the rectosigmoid colon. No opaque calculi are seen. The bones are unremarkable. IMPRESSION: 1. No present evidence of small-bowel obstruction. Very little gaseous distention of large or small bowel. 2. No opaque calculi . Electronically Signed   By: Ivar Drape M.D.   On: 11/15/2016 08:45   Ct Angio Chest Pe W And/or Wo Contrast  Result Date: 11/10/2016 CLINICAL DATA:  Shortness of breath, abdominal pain, right lower extremity DVT EXAM: CT ANGIOGRAPHY CHEST CT ABDOMEN AND PELVIS WITH CONTRAST TECHNIQUE: Multidetector CT imaging of the chest was performed using the standard protocol during bolus administration of intravenous contrast. Multiplanar CT image reconstructions and MIPs were obtained to evaluate the vascular anatomy. Multidetector CT imaging of the abdomen and pelvis was performed using the standard protocol during bolus administration of intravenous contrast.  CONTRAST:  100 mL Isovue 370 IV COMPARISON:  Chest radiograph 12/24/2003 and 11/10/2016 FINDINGS: CTA CHEST FINDINGS Cardiovascular: Contrast injection is sufficient to demonstrate satisfactory opacification of the pulmonary arteries to the segmental level. There is no pulmonary embolus. The main pulmonary artery is within normal limits for size. There is no CT evidence of acute right heart strain. There is calcific aortic atherosclerosis. There are multifocal coronary artery calcifications. There is a normal 3-vessel arch branching pattern. Heart size is normal, without pericardial effusion. Mediastinum/Nodes: No mediastinal, hilar or axillary lymphadenopathy. The visualized thyroid and thoracic esophageal course are unremarkable. Lungs/Pleura: No pulmonary nodules or masses. No pleural effusion or pneumothorax. No focal airspace consolidation. No focal pleural abnormality. Musculoskeletal: No chest wall abnormality. No acute or significant osseous findings. Review of the MIP images confirms the above findings. CT ABDOMEN and PELVIS FINDINGS Hepatobiliary: Moderate volume perihepatic ascites. No focal liver lesion. The gallbladder is filled with stones. Pancreas: Normal pancreatic contours and enhancement. No peripancreatic fluid collection or pancreatic ductal dilatation. Spleen: Medium volume perisplenic ascites. There is peritoneal nodularity particularly in the left upper quadrant and lower midline abdomen. Adrenals/Urinary Tract: Normal adrenal glands. No hydronephrosis or solid renal mass. Stomach/Bowel: There is no hiatal hernia. The stomach and duodenum are normal. There is no dilated small bowel or enteric inflammation. There is no colonic abnormality. Nonvisualization of the appendix. Vascular/Lymphatic: There is atherosclerotic calcification of the non aneurysmal abdominal aorta. Aortocaval lymph nodes measure up to 8 mm. Reproductive: There is a multilobulated mass of the right pelvis. It is unclear  whether it arises from the uterine  fundus to the right adnexa. There is a left ovarian cyst measuring 4 cm. Musculoskeletal: No lytic or blastic osseous lesion. Normal visualized extrathoracic and extraperitoneal soft tissues. Other: No contributory non-categorized findings. IMPRESSION: 1. No pulmonary embolus. 2. Extensive anterior peritoneal nodular implants, consistent with carcinomatosis. Perihepatic and perisplenic ascites is likely malignant. 3. Lobulated right pelvic mass arising from either the right adnexa or the uterine fundus. This may be the primary malignancy. 4. Gallbladder filled stones but no other evidence of acute cholecystitis. 5. Multiple subcentimeter retroperitoneal nodes. In this context, these could indicate lymphatic spread of disease. Electronically Signed   By: Ulyses Jarred M.D.   On: 11/10/2016 19:29   Ct Abdomen Pelvis W Contrast  Result Date: 12/02/2016 CLINICAL DATA:  71 y/o F; r/o sbo, Hx of ovarian CA, now w n/v, abd discomfort. EXAM: CT ABDOMEN AND PELVIS WITH CONTRAST TECHNIQUE: Multidetector CT imaging of the abdomen and pelvis was performed using the standard protocol following bolus administration of intravenous contrast. CONTRAST:  121mL ISOVUE-300 IOPAMIDOL (ISOVUE-300) INJECTION 61% COMPARISON:  11/12/2014 CT abdomen and pelvis. FINDINGS: Lower chest: Coronary artery calcifications. Small bilateral pleural effusions. Hepatobiliary: Hepatic steatosis. Cholelithiasis. No intra or extrahepatic biliary ductal dilatation. Pancreas: Unremarkable. No pancreatic ductal dilatation or surrounding inflammatory changes. Spleen: Normal in size without focal abnormality. Adrenals/Urinary Tract: Adrenal glands are unremarkable. Kidneys are normal, without renal calculi, focal lesion, or hydronephrosis. Bladder is unremarkable. Stomach/Bowel: No obstructive or inflammatory changes of bowel. Large volume of stool in rectum, question constipation. Vascular/Lymphatic: Aortic  atherosclerosis. No enlarged abdominal or pelvic lymph nodes. Reproductive: Stable 4 cm left ovarian cyst. Stable right adnexal mass measuring 59 x 51 x 36 mm (AP x ML x CC series 2, image 71 and series 3, image 62). Other: Moderate volume of peritoneal ascites is increased from prior CT of abdomen and pelvis. Nodular thickening within the omentum is decreased from the prior CT. There is persistent peritoneal carcinomatosis within omentum and pericolic gutters with small foci of enhancement. Increased anasarca within the abdominal wall. Musculoskeletal: No acute or significant osseous findings. IMPRESSION: 1. Moderate volume of ascites increased from prior CT and increased anasarca. 2. Omental and peritoneal carcinomatosis is mildly decreased. 3. Stable right adnexal mass measuring up to 5.9 cm. 4. Hepatic steatosis.  Cholelithiasis. 5. Small bilateral pleural effusions. 6. Large volume of stool in rectum, question constipation. Electronically Signed   By: Kristine Garbe M.D.   On: 12/02/2016 19:35   Ct Abdomen Pelvis W Contrast  Result Date: 11/10/2016 CLINICAL DATA:  Shortness of breath, abdominal pain, right lower extremity DVT EXAM: CT ANGIOGRAPHY CHEST CT ABDOMEN AND PELVIS WITH CONTRAST TECHNIQUE: Multidetector CT imaging of the chest was performed using the standard protocol during bolus administration of intravenous contrast. Multiplanar CT image reconstructions and MIPs were obtained to evaluate the vascular anatomy. Multidetector CT imaging of the abdomen and pelvis was performed using the standard protocol during bolus administration of intravenous contrast. CONTRAST:  100 mL Isovue 370 IV COMPARISON:  Chest radiograph 12/24/2003 and 11/10/2016 FINDINGS: CTA CHEST FINDINGS Cardiovascular: Contrast injection is sufficient to demonstrate satisfactory opacification of the pulmonary arteries to the segmental level. There is no pulmonary embolus. The main pulmonary artery is within normal limits  for size. There is no CT evidence of acute right heart strain. There is calcific aortic atherosclerosis. There are multifocal coronary artery calcifications. There is a normal 3-vessel arch branching pattern. Heart size is normal, without pericardial effusion. Mediastinum/Nodes: No mediastinal, hilar or axillary lymphadenopathy. The  visualized thyroid and thoracic esophageal course are unremarkable. Lungs/Pleura: No pulmonary nodules or masses. No pleural effusion or pneumothorax. No focal airspace consolidation. No focal pleural abnormality. Musculoskeletal: No chest wall abnormality. No acute or significant osseous findings. Review of the MIP images confirms the above findings. CT ABDOMEN and PELVIS FINDINGS Hepatobiliary: Moderate volume perihepatic ascites. No focal liver lesion. The gallbladder is filled with stones. Pancreas: Normal pancreatic contours and enhancement. No peripancreatic fluid collection or pancreatic ductal dilatation. Spleen: Medium volume perisplenic ascites. There is peritoneal nodularity particularly in the left upper quadrant and lower midline abdomen. Adrenals/Urinary Tract: Normal adrenal glands. No hydronephrosis or solid renal mass. Stomach/Bowel: There is no hiatal hernia. The stomach and duodenum are normal. There is no dilated small bowel or enteric inflammation. There is no colonic abnormality. Nonvisualization of the appendix. Vascular/Lymphatic: There is atherosclerotic calcification of the non aneurysmal abdominal aorta. Aortocaval lymph nodes measure up to 8 mm. Reproductive: There is a multilobulated mass of the right pelvis. It is unclear whether it arises from the uterine fundus to the right adnexa. There is a left ovarian cyst measuring 4 cm. Musculoskeletal: No lytic or blastic osseous lesion. Normal visualized extrathoracic and extraperitoneal soft tissues. Other: No contributory non-categorized findings. IMPRESSION: 1. No pulmonary embolus. 2. Extensive anterior  peritoneal nodular implants, consistent with carcinomatosis. Perihepatic and perisplenic ascites is likely malignant. 3. Lobulated right pelvic mass arising from either the right adnexa or the uterine fundus. This may be the primary malignancy. 4. Gallbladder filled stones but no other evidence of acute cholecystitis. 5. Multiple subcentimeter retroperitoneal nodes. In this context, these could indicate lymphatic spread of disease. Electronically Signed   By: Ulyses Jarred M.D.   On: 11/10/2016 19:29   US Abdomen Limited  Result Date: 12/03/2016 CLINICAL DATA:  Ascites. EXAM: LIMITED ABDOMEN ULTRASOUND FOR ASCITES TECHNIQUE: Limited ultrasound survey for ascites was performed in all four abdominal quadrants. COMPARISON:  Abdomen and pelvis CT dated 12/02/2016. FINDINGS: Small moderate amount of free peritoneal fluid. IMPRESSION: Small to moderate amount of ascites. Electronically Signed   By: Claudie Revering M.D.   On: 12/03/2016 10:13   US Abdomen Limited  Result Date: 11/20/2016 CLINICAL DATA:  Gyn malignancy with carcinomatosis. Abdominal distention and malignant small-bowel obstruction. Request is made for evaluations verbal paracentesis. EXAM: LIMITED ABDOMEN ULTRASOUND FOR ASCITES TECHNIQUE: Limited ultrasound survey for ascites was performed in all four abdominal quadrants. COMPARISON:  None. FINDINGS: All 4 quadrants evaluated and minimal ascites is noted. IMPRESSION: Not enough fluid for paracentesis. Read by: Saverio Danker, PA-C Electronically Signed   By: Markus Daft M.D.   On: 11/20/2016 13:05   Ir US Guide Vasc Access Left  Result Date: 11/25/2016 INDICATION: 71 year old with peritoneal carcinomatosis. Port-A-Cath needed for treatment. The patient's white blood cell count is markedly low and abnormal. This was discussed with the patient's oncologist, Dr. Alvy Bimler, who felt the patient was safe for Port-A-Cath placement. EXAM: FLUOROSCOPIC AND ULTRASOUND GUIDED PLACEMENT OF A SUBCUTANEOUS PORT.  Physician: Stephan Minister. Anselm Pancoast, MD MEDICATIONS: Cleocin 600 mg administered intravenously within one hour of incision. ANESTHESIA/SEDATION: Versed 1.0 mg IV; Fentanyl 25 mcg IV; Moderate Sedation Time:  40 minutes The patient was continuously monitored during the procedure by the interventional radiology nurse under my direct supervision. FLUOROSCOPY TIME:  54 seconds, 7 mGy COMPLICATIONS: None immediate. PROCEDURE: The risks of the procedure were explained to the patient. Informed consent was obtained. Patient was placed supine on the interventional table. Ultrasound confirmed a patent left internal jugular vein.  The left chest and neck were cleaned with a skin antiseptic and a sterile drape was placed. Maximal barrier sterile technique was utilized including caps, mask, sterile gowns, sterile gloves, sterile drape, hand hygiene and skin antiseptic. The left neck was anesthetized with 1% lidocaine. Small incision was made in the left neck with a blade. Micropuncture set was placed in the left IJ with ultrasound guidance. The left chest was anesthetized with 1% lidocaine with epinephrine. #15 blade was used to make an incision and a subcutaneous port pocket was formed. [place] was selected. Subcutaneous tunnel was formed with a stiff tunneling device. The port catheter was brought through the subcutaneous tunnel. The micropuncture set was exchanged for a peel-away sheath. The catheter was placed through the peel-away sheath and the tip was positioned at the superior cavoatrial junction. Catheter placement was confirmed with fluoroscopy. The port was assembled and placed within the subcutaneous pocket. The port was accessed and flushed with heparinized saline. The port pocket was closed using two layers of absorbable sutures and Dermabond. The vein skin site was closed using a single layer of absorbable suture and Dermabond. Sterile dressings were applied. Patient tolerated the procedure well without an  immediate complication. Ultrasound and fluoroscopic images were taken and saved for this procedure. IMPRESSION: Placement of a subcutaneous port device. Electronically Signed   By: Markus Daft M.D.   On: 11/25/2016 15:53   Dg Abd Portable 1v-small Bowel Obstruction Protocol-initial, 8 Hr Delay  Result Date: 11/20/2016 CLINICAL DATA:  71 year old female 8 hours status post enteric contrast via NG tube. EXAM: PORTABLE ABDOMEN - 1 VIEW COMPARISON:  1130 hours today, and earlier. FINDINGS: Portable AP supine view at 2123 hours. Stable enteric tube, side hole up the level of the gastric fundus. Oral contrast present throughout the right colon to the mid transverse colon. No dilated small or large bowel loops. Some retained stool in the colon. No acute osseous abnormality identified. IMPRESSION: 1. Enteric contrast has reached the mid transverse colon. No dilated small or large bowel loops. 2. Stable enteric tube. Electronically Signed   By: Genevie Ann M.D.   On: 11/20/2016 21:47   Dg Abd Portable 1v-small Bowel Protocol-position Verification  Result Date: 11/20/2016 CLINICAL DATA:  NG tube placement EXAM: PORTABLE ABDOMEN - 1 VIEW COMPARISON:  11/18/2016 FINDINGS: NG tube is in the stomach. Nonobstructive bowel gas pattern. No free air organomegaly. IMPRESSION: NG tube tip in the stomach. Electronically Signed   By: Rolm Baptise M.D.   On: 11/20/2016 11:47   Dg Abd Portable 1v  Result Date: 11/14/2016 CLINICAL DATA:  Small bowel obstruction, intra-abdominal ascites an malignancy. EXAM: PORTABLE ABDOMEN - 1 VIEW COMPARISON:  Abdominal and pelvic CT scan of November 10, 2016 FINDINGS: There are loops of mildly distended gas-filled small bowel present. There is stool and gas within normal caliber colon and rectum. No free extraluminal gas collections are observed. IMPRESSION: The bowel gas pattern is consistent with small bowel ileus or partial mid to distal small bowel obstruction. Electronically Signed   By: David   Martinique M.D.   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 Port-A-Cath needed for treatment. The patient's white blood cell count is markedly low and abnormal. This was discussed with the patient's oncologist, Dr. Alvy Bimler, who felt the patient was safe for Port-A-Cath placement. EXAM: FLUOROSCOPIC AND ULTRASOUND GUIDED PLACEMENT OF A SUBCUTANEOUS PORT. Physician: Stephan Minister. Anselm Pancoast, MD MEDICATIONS:  Cleocin 600 mg administered intravenously within one hour of incision. ANESTHESIA/SEDATION: Versed 1.0 mg IV; Fentanyl 25 mcg IV; Moderate Sedation Time:  40 minutes The patient was continuously monitored during the procedure by the interventional radiology nurse under my direct supervision. FLUOROSCOPY TIME:  54 seconds, 7 mGy COMPLICATIONS: None immediate. PROCEDURE: The risks of the procedure were explained to the patient. Informed consent was obtained. Patient was placed supine on the interventional table. Ultrasound confirmed a patent left internal jugular vein. The left chest and neck were cleaned with a skin antiseptic and a sterile drape was placed. Maximal barrier sterile technique was utilized including caps, mask, sterile gowns, sterile gloves, sterile drape, hand hygiene and skin antiseptic. The left neck was anesthetized with 1% lidocaine. Small incision was made in the left neck with a blade. Micropuncture set was placed in the left IJ with ultrasound guidance. The left chest was anesthetized with 1% lidocaine with epinephrine. #15 blade was used to make an incision and a subcutaneous port pocket was formed. [place] was selected. Subcutaneous tunnel was formed with a stiff tunneling device. The port catheter was brought through the subcutaneous tunnel. The micropuncture set was exchanged for a peel-away sheath. The catheter was placed through the peel-away sheath and the tip was positioned at the superior  cavoatrial junction. Catheter placement was confirmed with fluoroscopy. The port was assembled and placed within the subcutaneous pocket. The port was accessed and flushed with heparinized saline. The port pocket was closed using two layers of absorbable sutures and Dermabond. The vein skin site was closed using a single layer of absorbable suture and Dermabond. Sterile dressings were applied. Patient tolerated the procedure well without an immediate complication. Ultrasound and fluoroscopic images were taken and saved for this procedure. IMPRESSION: Placement of a subcutaneous port device. Electronically Signed   By: Markus Daft M.D.   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 MEDICATIONS: 10 cc 1% lidocaine. COMPLICATIONS: None immediate. TECHNIQUE: Informed written consent was obtained from the patient after a discussion of the risks, benefits and alternatives to treatment. A timeout was performed prior to the initiation of the procedure. Initial ultrasound scanning demonstrates a small amount of ascites within the right upper abdominal quadrant. The right upper abdomen was prepped and draped in the usual sterile fashion. 1% lidocaine with epinephrine was used for local anesthesia. Under direct ultrasound guidance, a 19 gauge, 7-cm, Yueh catheter was introduced. An ultrasound image was saved for documentation purposed. The paracentesis was performed. The catheter was removed and a dressing was applied. The patient tolerated the procedure well without immediate post procedural complication. FINDINGS: A total of approximately 420 cc of dark yellow fluid was removed. Samples were sent to the laboratory as requested by the clinical team. IMPRESSION: Successful ultrasound-guided paracentesis yielding 420 cc of peritoneal fluid. Read by Lavonia Drafts [facility] Electronically Signed   By: Jacqulynn Cadet M.D.   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   CT scan showed positive response to treatment I recommend we keep her in the hospital for cycle 2 of treatment, if  the patient is willing to pursue further chemotherapy, further discussion tomorrow.  Acquired pancytopenia She had recent leukocytosis likely due to G-CSF, peritonitis cannot be excluded She does not need blood transfusion unless hemoglobin is less than 8  Mild GI obstructive symptoms/ileus, causing nausea and constipation This is likely due to carcinomatosis Continue conservative approach Continue to advance her diet as tolerated I recommend aggressive laxative therapy due to significant stool burden on CT Continue antiemetics as needed  Extensive DVT Due to untreated cancer I do not feel it is necessary for IVC filter placement She is on Lovenox. I recommend we continue the same  Strong family history of cancer I would refer her to see genetic counselor after discharge  Diabetes and history of stroke Continue medical management  Poor social circumstances  The patient needs to go back to skilled nursing facility upon discharge, unsafe to return back to home.  Moderate to severe protein calorie malnutrition Due to poor oral intake and malignancy The patient has declined TPN in the past. Continue oral intake as tolerated  Goals of care Her disease is highly treatable, potentially curable with aggressive management The patient has living will at home [place] is changed to DNR per discussion with palliative care service last month  Discharge  planning She is not safe to I recommend cycle 2 of chemotherapy be discharged. Ideally, if she continues to improve over the next 2 days, on 12/08/2016 before discharge back to skilled nursing facility I will return to check on her tomorrow.    Heath Lark, MD 12/05/2016 9:54 AM

## 2016-12-06 ENCOUNTER — Other Ambulatory Visit: Payer: Self-pay | Admitting: Hematology and Oncology

## 2016-12-06 ENCOUNTER — Telehealth: Payer: Self-pay | Admitting: Hematology and Oncology

## 2016-12-06 LAB — CBC WITH DIFFERENTIAL/PLATELET
BASOS ABS: 0 10*3/uL (ref 0.0–0.1)
Basophils Relative: 0 %
Eosinophils Absolute: 0.1 10*3/uL (ref 0.0–0.7)
Eosinophils Relative: 1 %
HEMATOCRIT: 26.2 % — AB (ref 36.0–46.0)
HEMOGLOBIN: 8.7 g/dL — AB (ref 12.0–15.0)
LYMPHS PCT: 17 %
Lymphs Abs: 1.3 10*3/uL (ref 0.7–4.0)
MCH: 29.5 pg (ref 26.0–34.0)
MCHC: 33.2 g/dL (ref 30.0–36.0)
MCV: 88.8 fL (ref 78.0–100.0)
MONOS PCT: 4 %
Monocytes Absolute: 0.3 10*3/uL (ref 0.1–1.0)
NEUTROS ABS: 6 10*3/uL (ref 1.7–7.7)
Neutrophils Relative %: 78 %
Platelets: 209 10*3/uL (ref 150–400)
RBC: 2.95 MIL/uL — AB (ref 3.87–5.11)
RDW: 14.9 % (ref 11.5–15.5)
WBC: 7.7 10*3/uL (ref 4.0–10.5)

## 2016-12-06 LAB — BASIC METABOLIC PANEL
ANION GAP: 4 — AB (ref 5–15)
BUN: 7 mg/dL (ref 6–20)
CO2: 35 mmol/L — ABNORMAL HIGH (ref 22–32)
Calcium: 7.3 mg/dL — ABNORMAL LOW (ref 8.9–10.3)
Chloride: 94 mmol/L — ABNORMAL LOW (ref 101–111)
Creatinine, Ser: 0.54 mg/dL (ref 0.44–1.00)
GFR calc Af Amer: >60 mL/min (ref >60)
GLUCOSE: 97 mg/dL (ref 65–99)
POTASSIUM: 3.3 mmol/L — AB (ref 3.5–5.1)
Sodium: 133 mmol/L — ABNORMAL LOW (ref 135–145)

## 2016-12-06 LAB — ALBUMIN: ALBUMIN: 2.2 g/dL — AB (ref 3.5–5.0)

## 2016-12-06 LAB — MAGNESIUM: Magnesium: 1.7 mg/dL (ref 1.7–2.4)

## 2016-12-06 MED ORDER — DEXAMETHASONE SODIUM PHOSPHATE 4 MG/ML IJ SOLN
12.0000 mg | Freq: Once | INTRAMUSCULAR | Status: AC
  Administered 2016-12-06: 12 mg via INTRAVENOUS
  Filled 2016-12-06: qty 3

## 2016-12-06 MED ORDER — POTASSIUM CHLORIDE CRYS ER 20 MEQ PO TBCR
20.0000 meq | EXTENDED_RELEASE_TABLET | Freq: Two times a day (BID) | ORAL | Status: DC
  Administered 2016-12-06 – 2016-12-07 (×3): 20 meq via ORAL
  Filled 2016-12-06 (×3): qty 1

## 2016-12-06 MED ORDER — WITCH HAZEL-GLYCERIN EX PADS: MEDICATED_PAD | CUTANEOUS | Status: DC | PRN

## 2016-12-06 NOTE — Consult Note (Signed)
   [facility] Inpatient Consult   12/06/2016  NELI FOFANA [birth year] [id number]    Patient screened for potential [facility] Care Management services. Chart reviewed. Noted current discharge plan is to return to SNF. There are no identifiable [facility] Care Management needs at this time.  Confirmed with inpatient RNCM.    Marthenia Rolling, MSN-Ed, RN,BSN [facility] Liaison [phone]

## 2016-12-06 NOTE — Progress Notes (Signed)
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 Patient was admitted for possible SBP was placed on antibiotics and IR was consulted for paracentesis. Abdominal ultrasound was performed but there was not a safe window to do paracentesis. Patient was started on Albumin and Lasix with significant improvement. Oncology consulted planning for possible chemo on 12/07/16.   Subjective: Much better, "best I feel so far" Having frequent bowel movements. No other complaint. Edema improving.   Assessment & Plan: Peritoneal carcinomatosis - with ascites. Initially suspected SBP - Given increase in abdominal girth, increasing ascites on CT abd and elevated white count. Initially treated with Cipro,changed to Rocephin to complete total of 10 day unable to rule out infection but clinically improved  Ascites significantly improved  Paracentesis was ordered but ultrasound did not reveal a safe window for procedure Patient was started on neoadjuvant chemotherapy on 11/17/2016. She had round 1 of chemo, per oncology team, will have 2nd round of chemo tomorrow and possible d/c after treatment to SNF   Anasarca - Significantly improved  Likely to be cancer related with hypoalbuminemia Patient treated with Albumin x 3 doses and IV lasix  Continue IV Lasix for now, having good diuresis. Negative ~7 L  OOB as tolerated  Continue to monitor I&Os  Check BMP in AM   Ovarian cancer stage III See above Oncology recommendations appreciated - for possible chemo in AM   Right lower extremity DVT Cancer related  Will  continue therapeutic Lovenox for now   Hypokalemia/Hypomagnesemia  Likely from Lasix and poor nutrition  Replete  Check in AM   Anemia of malignancy  Hgb stable  Oncology following  No signs of overt bleeding  Transfuse if Hgb < 7   Severe protein caloric malnutrition Due to overlying cancer Nutrition consulted  DVT prophylaxis: Lovenox sq  Code Status: DNR Family Communication: None at bedside  Disposition Plan: SNF when medically stable  Consultants:   IR   Procedures:   None   Antimicrobials: Anti-infectives    Start     Dose/Rate Route Frequency Ordered Stop   12/05/16 1200  cefTRIAXone (ROCEPHIN) 2 g in dextrose 5 % 50 mL IVPB     2 g 100 mL/hr over 30 Minutes Intravenous Every 24 hours 12/05/16 1110     12/03/16 0000  ciprofloxacin (CIPRO) IVPB 400 mg  Status:  Discontinued     400 mg 200 mL/hr over 60 Minutes Intravenous Every 12 hours 12/02/16 2352 12/05/16 1110   12/02/16 2200  ciprofloxacin (CIPRO) IVPB 500 mg  Status:  Discontinued     500 mg 250 mL/hr over 60 Minutes Intravenous Every 12 hours 12/02/16 2138 12/02/16 2352      Objective: Vitals:   12/05/16 0433 12/05/16 1300 12/05/16 2058 12/06/16 0428  BP: 132/68 (!) 87/59 120/78 109/76  Pulse: 76 83 76 87  Resp: 18 18 18 16   Temp: 98.4 F (36.9 C) 98.7 F (37.1 C) 98 F (36.7 C) 98.6 F (37 C)  TempSrc: Oral Oral Oral Oral  SpO2: 96% 96% 98% 99%  Weight:      Height:        Intake/Output Summary (Last 24  hours) at 12/06/16 1521 Last data filed at 12/06/16 1143  Gross per 24 hour  Intake              290 ml  Output             2050 ml  Net            -1760 ml   Filed Weights   12/03/16 0004  Weight: 92.4 kg (203 lb 11.3 oz)    Examination:  General: Pt is alert, awake, not in acute distress, Anasarca continues to improve  Cardiovascular: RRR, S1/S2 +, no rubs, no gallops Respiratory: Anterior auscultation clear  Abdominal: Soft, NT, ND, bowel sounds + Extremities: LE edema  continues to improve   Data Reviewed: I have personally reviewed following labs and imaging studies  CBC:  Recent Labs Lab 12/02/16 1628 12/03/16 0821 12/04/16 0430 12/05/16 0500 12/06/16 0500  WBC 24.9* 17.0* 12.8* 9.3 7.7  NEUTROABS 23.2*  --  11.2*  --  6.0  HGB 9.9* 9.5* 8.8* 8.4* 8.7*  HCT 29.6* 29.1* 26.5* 25.7* 26.2*  MCV 89.4 90.4 89.8 88.6 88.8  PLT 207 203 194 190 062   Basic Metabolic Panel:  Recent Labs Lab 12/02/16 1628 12/03/16 0821 12/04/16 0430 12/05/16 0500 12/06/16 0500  NA 135 136 137 133* 133*  K 3.3* 3.2* 3.1* 3.1* 3.3*  CL 100* 100* 99* 93* 94*  CO2 31 33* 34* 36* 35*  GLUCOSE 170* 106* 129* 110* 97  BUN 12 11 10 8 7   CREATININE 0.62 0.58 0.53 0.54 0.54  CALCIUM 7.5* 7.5* 7.6* 7.5* 7.3*  MG  --   --   --  1.5* 1.7   GFR: Estimated Creatinine Clearance: 66.6 mL/min (by C-G formula based on SCr of 0.54 mg/dL). Liver Function Tests:  Recent Labs Lab 12/02/16 1628 12/03/16 0821 12/04/16 0430 12/05/16 0500 12/06/16 0500  AST 43* 40 42*  --   --   ALT 18 18 19   --   --   ALKPHOS 168* 154* 128*  --   --   BILITOT 0.1* 0.5 0.3  --   --   PROT 4.6* 4.4* 4.4*  --   --   ALBUMIN 1.8* 1.8* 2.0* 2.0* 2.2*    Recent Labs Lab 12/02/16 1628  LIPASE 11   No results for input(s): AMMONIA in the last 168 hours. Coagulation Profile: No results for input(s): INR, PROTIME in the last 168 hours. Cardiac Enzymes: No results for input(s): CKTOTAL, CKMB, CKMBINDEX, TROPONINI in the last 168 hours. BNP (last 3 results) No results for input(s): PROBNP in the last 8760 hours. HbA1C: No results for input(s): HGBA1C in the last 72 hours. CBG:  Recent Labs Lab 12/03/16 2001 12/04/16 0808 12/04/16 1212 12/04/16 1657 12/04/16 1945  GLUCAP 101* 108* 98 134* 119*   Lipid Profile: No results for input(s): CHOL, HDL, LDLCALC, TRIG, CHOLHDL, LDLDIRECT in the last 72 hours. Thyroid Function Tests: No results for input(s): TSH, T4TOTAL, FREET4,  T3FREE, THYROIDAB in the last 72 hours. Anemia Panel: No results for input(s): VITAMINB12, FOLATE, FERRITIN, TIBC, IRON, RETICCTPCT in the last 72 hours. Sepsis Labs: No results for input(s): PROCALCITON, LATICACIDVEN in the last 168 hours.  No results found for this or any previous visit (from the past 240 hour(s)).    Radiology Studies: US Abdomen Limited  Result Date: 12/05/2016 CLINICAL DATA:  Evaluate for abdominal ascites and perform ultrasound-guided paracentesis as indicated. EXAM: LIMITED ABDOMEN ULTRASOUND FOR ASCITES TECHNIQUE: Limited ultrasound survey for  ascites was performed in all four abdominal quadrants. COMPARISON:  Ascites search ultrasound - 12/03/2016; CT abdomen pelvis - 12/02/2016 FINDINGS: Four quadrant grayscale images are provided of the abdomen and demonstrates only a trace to small amount of peritoneal fluid with largest collection with the left upper abdominal quadrant, similar to abdominal CT performed 12/02/2016 and too small to allow for safe ultrasound-guided paracentesis. IMPRESSION: Trace to small amount of intra-abdominal ascites, too small to allow for safe ultrasound-guided paracentesis. Electronically Signed   By: Sandi Mariscal M.D.   On: 12/05/2016 10:59    Scheduled Meds: . dexamethasone  12 mg Intravenous Once  . enoxaparin  85 mg Subcutaneous Q12H  . feeding supplement  1 Container Oral TID WC  . furosemide  40 mg Intravenous BID  . gabapentin  300 mg Oral QHS  . metoprolol succinate  25 mg Oral Daily  . pantoprazole  40 mg Oral Q0600  . polyethylene glycol  17 g Oral Daily  . potassium chloride  20 mEq Oral BID  . senna-docusate  2 tablet Oral BID   Continuous Infusions: . cefTRIAXone (ROCEPHIN)  IV Stopped (12/06/16 1229)     LOS: 4 days    Time spent: Total of 15 minutes spent with pt, greater than 50% of which was spent in discussion of  treatment, counseling and coordination of care   Chipper Oman, MD Pager: Text Page via  [email or web address]  [phone]  If 7PM-7AM, please contact night-coverage [email or web address] Password [email or web address] 12/06/2016, 3:21 PM

## 2016-12-06 NOTE — NC FL2 (Signed)
[place] LEVEL OF CARE SCREENING TOOL     IDENTIFICATION  Patient Name: Annette Farmer Birthdate: 04/04/1946 Sex: female Admission Date (Current Location): 12/02/2016  [facility] and [place] Number:  Herbalist and Address:  [facility],  [address], [place]      Provider Number: [id number]  Attending Physician Name and Address:  Patrecia Pour, Christean Grief, MD  Relative Name and Phone Number:       Current Level of Care: Hospital Recommended Level of Care: [place] Prior Approval Number:    Date Approved/Denied:   PASRR Number: [id number]  Discharge Plan: SNF    Current Diagnoses: Patient Active Problem List   Diagnosis Date Noted  . Peritonitis ([place]) 12/02/2016  . Peritoneal carcinomatosis ([place])   . Ovarian cancer on right [facility]): Stage 3C 11/16/2016  . Ileus ([place]) 11/16/2016  . Acute thromboembolism of deep veins of right lower extremity (HCC)   . Port-a-cath in place   . DNR (do not resuscitate)   . Metastatic cancer ([place])   . Palliative care encounter   . Goals of care, counseling/discussion   . Bowel obstruction ([place])   . Severe protein-calorie malnutrition ([place])   . AKI (acute kidney injury) ([place]) 11/11/2016  . Pelvic mass 11/11/2016  . Carcinomatosis ([place]) 11/11/2016  . DVT (deep venous thrombosis) ([place]) 11/10/2016  . Refusal of statin medication by patient 09/28/2016  . Hemiparesis affecting right side as late effect of cerebrovascular accident ([place]) 04/23/2012  . Type 2 diabetes mellitus ([place]) 07/20/2006  . HYPERCHOLESTEROLEMIA 07/20/2006  . OBESITY, NOS 07/20/2006  . HYPERTENSION, BENIGN SYSTEMIC 07/20/2006  . HEMORRHOIDS, NOS 07/20/2006  . CONSTIPATION 07/20/2006  . INCONTINENCE, STRESS, FEMALE 07/20/2006  . MENOPAUSAL SYNDROME 07/20/2006    Orientation RESPIRATION BLADDER Height & Weight     Self, Time, Situation, Place  Normal External catheter Weight: 203 lb 11.3 oz (92.4  kg) Height:  5' 1.5" (156.2 cm)  BEHAVIORAL SYMPTOMS/MOOD NEUROLOGICAL BOWEL NUTRITION STATUS  Other (Comment) (no behaviors)   Continent Diet  AMBULATORY STATUS COMMUNICATION OF NEEDS Skin   Extensive Assist Verbally Normal                       Personal Care Assistance Level of Assistance  Bathing, Feeding, Dressing Bathing Assistance: Maximum assistance Feeding assistance: Independent Dressing Assistance: Maximum assistance     Functional Limitations Info  Sight, Hearing, Speech Sight Info: Adequate Hearing Info: Adequate Speech Info: Adequate    SPECIAL CARE FACTORS FREQUENCY  PT (By licensed PT), OT (By licensed OT)     PT Frequency: 5x wk OT Frequency: 5x wk            Contractures Contractures Info: Not present    Additional Factors Info  Allergies, Code Status Code Status Info: DNR Allergies Info: PENICILLINS           Current Medications (12/06/2016):  This is the current hospital active medication list Current Facility-Administered Medications  Medication Dose Route Frequency Provider Last Rate Last Dose  . acetaminophen (TYLENOL) tablet 650 mg  650 mg Oral Q6H PRN Holwerda, Scott, MD      . albuterol (PROVENTIL) (2.5 MG/3ML) 0.083% nebulizer solution 2.5 mg  2.5 mg Nebulization Q6H PRN Velna Hatchet, MD      . alum & mag hydroxide-simeth (MAALOX/MYLANTA) 200-200-20 MG/5ML suspension 30 mL  30 mL Oral Q4H PRN Velna Hatchet, MD   30 mL at 12/04/16 2139  . cefTRIAXone (  ROCEPHIN) 2 g in dextrose 5 % 50 mL IVPB  2 g Intravenous Q24H Patrecia Pour, Christean Grief, MD   Stopped at 12/05/16 1337  . dexamethasone (DECADRON) injection 12 mg  12 mg Intravenous Once Gorsuch, Ni, MD      . enoxaparin (LOVENOX) injection 85 mg  85 mg Subcutaneous Q12H Velna Hatchet, MD   85 mg at 12/05/16 2341  . feeding supplement (BOOST / RESOURCE BREEZE) liquid 1 Container  1 Container Oral TID WC Patrecia Pour, Christean Grief, MD   1 Container at 12/05/16 1700  . furosemide (LASIX)  injection 40 mg  40 mg Intravenous BID Patrecia Pour, Christean Grief, MD   40 mg at 12/06/16 0757  . gabapentin (NEURONTIN) capsule 300 mg  300 mg Oral QHS Velna Hatchet, MD   300 mg at 12/05/16 2117  . metoprolol succinate (TOPROL-XL) 24 hr tablet 25 mg  25 mg Oral Daily Velna Hatchet, MD   25 mg at 12/05/16 1053  . ondansetron (ZOFRAN) tablet 4 mg  4 mg Oral Q6H PRN Velna Hatchet, MD       Or  . ondansetron (ZOFRAN) injection 4 mg  4 mg Intravenous Q6H PRN Velna Hatchet, MD      . ondansetron (ZOFRAN) injection 4 mg  4 mg Intravenous Q6H PRN Opyd, Ilene Qua, MD   4 mg at 12/03/16 0027  . pantoprazole (PROTONIX) EC tablet 40 mg  40 mg Oral Q0600 Velna Hatchet, MD   40 mg at 12/06/16 0510  . polyethylene glycol (MIRALAX / GLYCOLAX) packet 17 g  17 g Oral Daily Alvy Bimler, Ni, MD   17 g at 12/05/16 1053  . potassium chloride SA (K-DUR,KLOR-CON) CR tablet 20 mEq  20 mEq Oral BID Patrecia Pour, Christean Grief, MD      . senna-docusate (Senokot-S) tablet 2 tablet  2 tablet Oral BID Heath Lark, MD   2 tablet at 12/05/16 2117  . sodium chloride flush (NS) 0.9 % injection 10-40 mL  10-40 mL Intracatheter PRN Patrecia Pour, Christean Grief, MD      . traMADol Veatrice Bourbon) tablet 50 mg  50 mg Oral Q6H PRN Velna Hatchet, MD   50 mg at 12/06/16 0510     Discharge Medications: Please see discharge summary for a list of discharge medications.  Relevant Imaging Results:  Relevant Lab Results:   Additional Information SS# [id number]  Kamare Caspers, Randall An, LCSW

## 2016-12-06 NOTE — Care Management Important Message (Signed)
Important Message  Patient Details  Name: Annette Farmer MRN: [id number] Date of Birth: 09-27-1945   Medicare Important Message Given:  Yes    [name] 12/06/2016, 2:43 [place] Message  Patient Details  Name: Annette Farmer MRN: [id number] Date of Birth: 07-16-1945   Medicare Important Message Given:  Yes    [name] 12/06/2016, 2:43 PM

## 2016-12-06 NOTE — Progress Notes (Signed)
Annette Farmer   DOB:04-Mar-1946   DZ#:[id number]    Subjective: She is feeling better.  Her pain is less.  She is still constipated.  She is able to tolerate some solid intake without significant nausea or vomiting.  Objective:  Vitals:   12/05/16 2058 12/06/16 0428  BP: 120/78 109/76  Pulse: 76 87  Resp: 18 16  Temp: 98 F (36.7 C) 98.6 F (37 C)     Intake/Output Summary (Last 24 hours) at 12/06/16 3419 Last data filed at 12/05/16 2353  Gross per 24 hour  Intake              290 ml  Output             2400 ml  Net            -2110 ml    GENERAL:alert, no distress and comfortable SKIN: skin color, texture, turgor are normal, no rashes or significant lesions EYES: normal, Conjunctiva are pink and non-injected, sclera clear Musculoskeletal:no cyanosis of digits and no clubbing  NEURO: alert & oriented x 3 with fluent speech, no focal motor/sensory deficits   Labs:  Lab Results  Component Value Date   WBC 7.7 12/06/2016   HGB 8.7 (L) 12/06/2016   HCT 26.2 (L) 12/06/2016   MCV 88.8 12/06/2016   PLT 209 12/06/2016   NEUTROABS 6.0 12/06/2016    Lab Results  Component Value Date   NA 133 (L) 12/06/2016   K 3.3 (L) 12/06/2016   CL 94 (L) 12/06/2016   CO2 35 (H) 12/06/2016    Studies:  US Abdomen Limited  Result Date: 12/05/2016 CLINICAL DATA:  Evaluate for abdominal ascites and perform ultrasound-guided paracentesis as indicated. EXAM: LIMITED ABDOMEN ULTRASOUND FOR ASCITES TECHNIQUE: Limited ultrasound survey for ascites was performed in all four abdominal quadrants. COMPARISON:  Ascites search ultrasound - 12/03/2016; CT abdomen pelvis - 12/02/2016 FINDINGS: Four quadrant grayscale images are provided of the abdomen and demonstrates only a trace to small amount of peritoneal fluid with largest collection with the left upper abdominal quadrant, similar to abdominal CT performed 12/02/2016 and too small to allow for safe ultrasound-guided paracentesis. IMPRESSION: Trace to  small amount of intra-abdominal ascites, too small to allow for safe ultrasound-guided paracentesis. Electronically Signed   By: Sandi Mariscal M.D.   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   CT scan showed positive response to treatment I recommend we keep her in the hospital for cycle 2 of treatment tomorrow.  Acquired pancytopenia She had recent leukocytosis likely due to G-CSF, peritonitis cannot be excluded She does not need blood transfusion unless hemoglobin is less than 8  Mild GI obstructive symptoms/ileus, causing nausea and constipation This is likely due to carcinomatosis Continue conservative approach Continue to advance her diet as tolerated I recommend aggressive laxative therapy due to significant stool burden on CT Continue antiemetics as needed  Extensive DVT Due to untreated cancer I do not feel it is necessary for IVC filter placement She is on Lovenox. I recommend we continue the same  Strong family history of cancer I would refer her to see genetic counselor after discharge  Diabetes and history of stroke Continue  medical management  Poor social circumstances  The patient needs to go back to skilled nursing facility upon discharge, unsafe to return back to home.  Moderate to severe protein calorie malnutrition Due to poor oral intake and malignancy Continue oral intake as tolerated  Goals of care Her disease is highly treatable, potentially curable with aggressive management The patient has living will at home [place] is changed to DNR per discussion with palliative care service last  month  Discharge planning She is not safe to I recommend cycle 2 of chemotherapy be discharged. I will move for chemotherapy to tomorrow, only one day early.  She is ready for treatment. After chemotherapy, she can be discharged back to skilled nursing facility I will schedule return follow-up next week and Neulasta injection in the outpatient clinic.  Heath Lark, MD 12/06/2016  8:22 AM

## 2016-12-06 NOTE — Telephone Encounter (Signed)
Scheduled appt per sch message from Dr. Alvy Bimler - sent message to Jenny Reichmann to see if was okay to ad on new start treatment to 7/27 ( capped day )  When r/s'd - reminder letter to be sent.

## 2016-12-06 NOTE — Care Management Note (Signed)
Case Management Note  Patient Details  Name: Annette Farmer MRN: [id number] Date of Birth: [birth year]  Subjective/Objective:       71 yo admitted with Peritonitis. 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  Expected Discharge Date:  12/06/16               Expected Discharge Plan:  Skilled Nursing Facility  In-House Referral:  Clinical Social Work  Discharge planning Services  CM Consult  Post Acute Care Choice:    Choice offered to:     DME Arranged:    DME Agency:     HH Arranged:    [place] Agency:     Status of Service:  In process, will continue to follow  If discussed at Long Length of Stay Meetings, dates discussed:    Additional CommentsLynnell Catalan, RN 12/06/2016, 10:57 AM  [phone]

## 2016-12-07 ENCOUNTER — Other Ambulatory Visit: Payer: Medicare Other

## 2016-12-07 ENCOUNTER — Ambulatory Visit: Payer: Medicare Other | Admitting: Hematology and Oncology

## 2016-12-07 DIAGNOSIS — R180 Malignant ascites: Secondary | ICD-10-CM

## 2016-12-07 DIAGNOSIS — C561 Malignant neoplasm of right ovary: Secondary | ICD-10-CM

## 2016-12-07 LAB — BASIC METABOLIC PANEL
Anion gap: 7 (ref 5–15)
BUN: 8 mg/dL (ref 6–20)
CALCIUM: 7.8 mg/dL — AB (ref 8.9–10.3)
CO2: 33 mmol/L — AB (ref 22–32)
Chloride: 92 mmol/L — ABNORMAL LOW (ref 101–111)
Creatinine, Ser: 0.58 mg/dL (ref 0.44–1.00)
GFR calc Af Amer: >60 mL/min (ref >60)
GLUCOSE: 176 mg/dL — AB (ref 65–99)
Potassium: 4.2 mmol/L (ref 3.5–5.1)
Sodium: 132 mmol/L — ABNORMAL LOW (ref 135–145)

## 2016-12-07 LAB — CBC WITH DIFFERENTIAL/PLATELET
BASOS ABS: 0 10*3/uL (ref 0.0–0.1)
BASOS PCT: 0 %
EOS PCT: 0 %
Eosinophils Absolute: 0 10*3/uL (ref 0.0–0.7)
HEMATOCRIT: 29.4 % — AB (ref 36.0–46.0)
Hemoglobin: 9.6 g/dL — ABNORMAL LOW (ref 12.0–15.0)
Lymphocytes Relative: 3 %
Lymphs Abs: 0.3 10*3/uL — ABNORMAL LOW (ref 0.7–4.0)
MCH: 29.4 pg (ref 26.0–34.0)
MCHC: 32.7 g/dL (ref 30.0–36.0)
MCV: 90.2 fL (ref 78.0–100.0)
MONO ABS: 0.2 10*3/uL (ref 0.1–1.0)
MONOS PCT: 2 %
Neutro Abs: 9.5 10*3/uL — ABNORMAL HIGH (ref 1.7–7.7)
Neutrophils Relative %: 95 %
PLATELETS: 242 10*3/uL (ref 150–400)
RBC: 3.26 MIL/uL — ABNORMAL LOW (ref 3.87–5.11)
RDW: 15.2 % (ref 11.5–15.5)
WBC: 10 10*3/uL (ref 4.0–10.5)

## 2016-12-07 LAB — TYPE AND SCREEN
ABO/RH(D): A NEG
Antibody Screen: NEGATIVE

## 2016-12-07 MED ORDER — EPINEPHRINE PF 1 MG/10ML IJ SOSY: 0.2500 mg | PREFILLED_SYRINGE | Freq: Once | INTRAMUSCULAR | Status: DC | PRN

## 2016-12-07 MED ORDER — HEPARIN SOD (PORK) LOCK FLUSH 100 UNIT/ML IV SOLN
500.0000 [IU] | Freq: Once | INTRAVENOUS | Status: DC | PRN
  Filled 2016-12-07: qty 5

## 2016-12-07 MED ORDER — SODIUM CHLORIDE 0.9 % IV SOLN
20.0000 mg | Freq: Once | INTRAVENOUS | Status: AC
  Administered 2016-12-07: 20 mg via INTRAVENOUS
  Filled 2016-12-07: qty 2

## 2016-12-07 MED ORDER — SODIUM CHLORIDE 0.9 % IV SOLN: Freq: Once | INTRAVENOUS | Status: DC | PRN

## 2016-12-07 MED ORDER — SODIUM CHLORIDE 0.9 % IV SOLN
501.6000 mg | Freq: Once | INTRAVENOUS | Status: AC
  Administered 2016-12-07: 500 mg via INTRAVENOUS
  Filled 2016-12-07: qty 50

## 2016-12-07 MED ORDER — POTASSIUM CHLORIDE CRYS ER 20 MEQ PO TBCR: 20.0000 meq | EXTENDED_RELEASE_TABLET | Freq: Every day | ORAL | 0 refills | Status: DC

## 2016-12-07 MED ORDER — POLYETHYLENE GLYCOL 3350 17 G PO PACK: 17.0000 g | PACK | Freq: Every day | ORAL | 0 refills | Status: AC

## 2016-12-07 MED ORDER — COLD PACK MISC ONCOLOGY
1.0000 | Freq: Once | Status: DC | PRN
  Filled 2016-12-07: qty 1

## 2016-12-07 MED ORDER — LASIX 40 MG PO TABS: 40.0000 mg | ORAL_TABLET | Freq: Two times a day (BID) | ORAL | 0 refills | Status: DC

## 2016-12-07 MED ORDER — ALTEPLASE 2 MG IJ SOLR: 2.0000 mg | Freq: Once | INTRAMUSCULAR | Status: DC | PRN

## 2016-12-07 MED ORDER — SODIUM CHLORIDE 0.9 % IV SOLN
Freq: Once | INTRAVENOUS | Status: AC
  Administered 2016-12-07: 10:00:00 via INTRAVENOUS

## 2016-12-07 MED ORDER — EPINEPHRINE PF 1 MG/ML IJ SOLN: 0.5000 mg | Freq: Once | INTRAMUSCULAR | Status: DC | PRN

## 2016-12-07 MED ORDER — FAMOTIDINE IN NACL 20-0.9 MG/50ML-% IV SOLN
20.0000 mg | Freq: Once | INTRAVENOUS | Status: DC | PRN
  Filled 2016-12-07: qty 50

## 2016-12-07 MED ORDER — DIPHENHYDRAMINE HCL 50 MG/ML IJ SOLN
50.0000 mg | Freq: Once | INTRAMUSCULAR | Status: AC
  Administered 2016-12-07: 50 mg via INTRAVENOUS
  Filled 2016-12-07: qty 1

## 2016-12-07 MED ORDER — DIPHENHYDRAMINE HCL 50 MG/ML IJ SOLN: 50.0000 mg | Freq: Once | INTRAMUSCULAR | Status: DC | PRN

## 2016-12-07 MED ORDER — PACLITAXEL CHEMO INJECTION 300 MG/50ML
175.0000 mg/m2 | Freq: Once | INTRAVENOUS | Status: AC
  Administered 2016-12-07: 312 mg via INTRAVENOUS
  Filled 2016-12-07: qty 52

## 2016-12-07 MED ORDER — TRAMADOL HCL 50 MG PO TABS: 50.0000 mg | ORAL_TABLET | Freq: Four times a day (QID) | ORAL | 0 refills | Status: DC | PRN

## 2016-12-07 MED ORDER — BOOST / RESOURCE BREEZE PO LIQD: 1.0000 | Freq: Three times a day (TID) | ORAL | 12 refills | Status: AC

## 2016-12-07 MED ORDER — SODIUM CHLORIDE 0.9% FLUSH: 3.0000 mL | INTRAVENOUS | Status: DC | PRN

## 2016-12-07 MED ORDER — SENNOSIDES-DOCUSATE SODIUM 8.6-50 MG PO TABS: 2.0000 | ORAL_TABLET | Freq: Two times a day (BID) | ORAL | 0 refills | Status: AC

## 2016-12-07 MED ORDER — ALBUTEROL SULFATE (2.5 MG/3ML) 0.083% IN NEBU: 2.5000 mg | INHALATION_SOLUTION | Freq: Once | RESPIRATORY_TRACT | Status: DC | PRN

## 2016-12-07 MED ORDER — PALONOSETRON HCL INJECTION 0.25 MG/5ML
0.2500 mg | Freq: Once | INTRAVENOUS | Status: AC
  Administered 2016-12-07: 0.25 mg via INTRAVENOUS
  Filled 2016-12-07: qty 5

## 2016-12-07 MED ORDER — FAMOTIDINE IN NACL 20-0.9 MG/50ML-% IV SOLN
20.0000 mg | Freq: Once | INTRAVENOUS | Status: AC
  Administered 2016-12-07: 20 mg via INTRAVENOUS
  Filled 2016-12-07: qty 50

## 2016-12-07 MED ORDER — METHYLPREDNISOLONE SODIUM SUCC 125 MG IJ SOLR: 125.0000 mg | Freq: Once | INTRAMUSCULAR | Status: DC | PRN

## 2016-12-07 MED ORDER — CIPROFLOXACIN HCL 500 MG PO TABS: 500.0000 mg | ORAL_TABLET | Freq: Two times a day (BID) | ORAL | 0 refills | Status: AC

## 2016-12-07 MED ORDER — SODIUM CHLORIDE 0.9% FLUSH: 10.0000 mL | INTRAVENOUS | Status: DC | PRN

## 2016-12-07 MED ORDER — HEPARIN SOD (PORK) LOCK FLUSH 100 UNIT/ML IV SOLN: 250.0000 [IU] | Freq: Once | INTRAVENOUS | Status: DC | PRN

## 2016-12-07 MED ORDER — DIPHENHYDRAMINE HCL 50 MG/ML IJ SOLN: 25.0000 mg | Freq: Once | INTRAMUSCULAR | Status: DC | PRN

## 2016-12-07 NOTE — Progress Notes (Signed)
Annette Farmer   DOB:1946-05-07   WN#:[id number]    Subjective: She felt better.  She had multiple bowel movement yesterday.  Her abdominal pain is less.  She is able to tolerate more solid food.  Denies nausea or vomiting.  Objective:  Vitals:   12/06/16 2142 12/07/16 0510  BP: 130/86 123/73  Pulse: 76 81  Resp: 17 16  Temp: 98 F (36.7 C) 98.8 F (37.1 C)     Intake/Output Summary (Last 24 hours) at 12/07/16 1017 Last data filed at 12/07/16 0959  Gross per 24 hour  Intake              900 ml  Output             1750 ml  Net             -850 ml    GENERAL:alert, no distress and comfortable SKIN: skin color, texture, turgor are normal, no rashes or significant lesions EYES: normal, Conjunctiva are pink and non-injected, sclera clear ABDOMEN:abdomen soft, non-tender and normal bowel sounds Musculoskeletal:no cyanosis of digits and no clubbing  NEURO: alert & oriented x 3 with fluent speech, no focal motor/sensory deficits   Labs:  Lab Results  Component Value Date   WBC 10.0 12/07/2016   HGB 9.6 (L) 12/07/2016   HCT 29.4 (L) 12/07/2016   MCV 90.2 12/07/2016   PLT 242 12/07/2016   NEUTROABS 9.5 (H) 12/07/2016    Lab Results  Component Value Date   NA 132 (L) 12/07/2016   K 4.2 12/07/2016   CL 92 (L) 12/07/2016   CO2 33 (H) 12/07/2016    Studies:  US Abdomen Limited  Result Date: 12/05/2016 CLINICAL DATA:  Evaluate for abdominal ascites and perform ultrasound-guided paracentesis as indicated. EXAM: LIMITED ABDOMEN ULTRASOUND FOR ASCITES TECHNIQUE: Limited ultrasound survey for ascites was performed in all four abdominal quadrants. COMPARISON:  Ascites search ultrasound - 12/03/2016; CT abdomen pelvis - 12/02/2016 FINDINGS: Four quadrant grayscale images are provided of the abdomen and demonstrates only a trace to small amount of peritoneal fluid with largest collection with the left upper abdominal quadrant, similar to abdominal CT performed 12/02/2016 and too small to  allow for safe ultrasound-guided paracentesis. IMPRESSION: Trace to small amount of intra-abdominal ascites, too small to allow for safe ultrasound-guided paracentesis. Electronically Signed   By: Sandi Mariscal M.D.   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  CT scan showedpositive response to treatment I recommend we proceed with cycle 2 of treatment today before DC.  Acquired pancytopenia She had recent leukocytosis likely due to G-CSF, peritonitis cannot be excluded She does not need blood transfusion unless hemoglobin is less than 8  Mild GI obstructive symptoms/ileus, causing nausea and constipation, resolved This is likely due to carcinomatosis Continue conservative approach Continue to advance her diet as tolerated I recommend aggressive laxative therapy due to significant stool burden on CT Continue antiemetics as needed  Extensive DVT Due to untreated cancer I do not feel it is necessary for IVC filter placement She is on Lovenox. I recommend we continue the same  Strong family history of cancer I would refer her to see genetic counselor after  discharge  Diabetes and history of stroke Continue medical management  Poor social circumstances  The patient needs to go back to skilled nursing facility upon discharge, unsafe to return back to home.  Moderate to severe protein calorie malnutrition Due to poor oral intake and malignancy Continue oral intake as tolerated  Goals of care Her disease is highly treatable, potentially curable with aggressive management The patient has living will at home [place] is changed to  DNR per discussion with palliative care service last month  Discharge planning. After chemotherapy, she can be discharged back to skilled nursing facility I will schedule return follow-up next week and Neulasta injection in the outpatient clinic.   Heath Lark, MD 12/07/2016  10:17 AM

## 2016-12-07 NOTE — Progress Notes (Cosign Needed)
Chemo calculations and dosages checked with another chemo nurse, Drue Dun RN

## 2016-12-07 NOTE — Progress Notes (Addendum)
CSW assisting with dc planning. Pt has a SNF bed at [place] once [facility] provides authorization for placement. CSW has spoken with insurance CM and has been informed that [facility] is reviewing request and a decision should be available shortly. CSW will continue to follow to assist with d/c planning.  Werner Lean LCSW [phone]  [phone] : [employer] medicare has provided authorization for SNF placement. Pt in agreement with plan to return to [place] today. SNF reports that her same room is available ( semi pvt with no one in bed next to her). [facility] transport is required. Medical necessity form completed. Scripts included in [employer]. # for report provided to nsg.  Werner Lean LCSW [phone]

## 2016-12-07 NOTE — Progress Notes (Signed)
Physical Therapy Treatment Patient Details Name: Annette Farmer MRN: [id number] DOB: 1945-11-06 Today's Date: 12/07/2016    History of Present Illness Pt is a 71 y.o. female with medical history significant of HTN, DM type II, CVA, and depression. Recent admission 6/21-7/10/18 for right leg swelling.  Ultrasound revealed extensive clot RLE. Abdominal CT revealed right adnexal tumor with carcinomatosis. DCed to SNF, now readmitted 12/02/16 with N/V, ascites, peritonitis.      PT Comments    The patient did mobilize to edge of bed with min assist. Stood x 1 from bed holding back of chair. Declined to move to recliner siting it is uncomfortable. Continue PT while in acute care.   Follow Up Recommendations  SNF     Equipment Recommendations  None recommended by PT    Recommendations for Other Services       Precautions / Restrictions Precautions Precautions: Fall Precaution Comments: CHEMO, external catheter, incontinent    Mobility  Bed Mobility   Bed Mobility: Supine to Sit;Sit to Supine     Supine to sit: Min assist Sit to supine: Min assist   General bed mobility comments: assist with  trunk  by pulling with 1 hand, assist legs onto bed. the patient slides self up in the bed using rails.  Transfers Overall transfer level: Needs assistance   Transfers: Sit to/from Stand Sit to Stand: From elevated surface         General transfer comment: attempted to stand x 3 from low surface , then raised bed and stood x 30 seconds. declined to transfer to recliner or BSC. Incontinent of urine during session.  Ambulation/Gait                 Stairs            Wheelchair Mobility    Modified Rankin (Stroke Patients Only)       Balance   Sitting-balance support: Feet supported;No upper extremity supported Sitting balance-Leahy Scale: Fair                                      Cognition Arousal/Alertness: Awake/alert Behavior During  Therapy: WFL for tasks assessed/performed                                   General Comments: requires encouragement      Exercises      General Comments        Pertinent Vitals/Pain Pain Assessment: No/denies pain    Home Living                      Prior Function            PT Goals (current goals can now be found in the care plan section) Progress towards PT goals: Progressing toward goals    Frequency    Min 2X/week      PT Plan Current plan remains appropriate;Frequency needs to be updated    Co-evaluation              AM-PAC PT "6 Clicks" Daily Activity  Outcome Measure  Difficulty turning over in bed (including adjusting bedclothes, sheets and blankets)?: Total Difficulty moving from lying on back to sitting on the side of the bed? : Total Difficulty sitting down on and standing up from a chair with  arms (e.g., wheelchair, bedside commode, etc,.)?: Total Help needed moving to and from a bed to chair (including a wheelchair)?: Total Help needed walking in hospital room?: Total Help needed climbing 3-5 steps with a railing? : Total 6 Click Score: 6    End of Session   Activity Tolerance: Patient tolerated treatment well Patient left: in bed;with call bell/phone within reach;with bed alarm set Nurse Communication: Mobility status PT Visit Diagnosis: Muscle weakness (generalized) (M62.81);Difficulty in walking, not elsewhere classified (R26.2);Pain     Time: 0821-     Charges:  $Therapeutic Activity: 8-22 mins                    G CodesTresa Endo PT [phone]    Claretha Cooper 12/07/2016, 8:41 AM

## 2016-12-07 NOTE — Progress Notes (Signed)
Patient tolerated chemo well. 

## 2016-12-07 NOTE — Discharge Summary (Signed)
Physician Discharge Summary  Annette Farmer AST:[id number] DOB: 1946/04/30 DOA: 12/02/2016  PCP: Wardell Honour, MD  Admit date: 12/02/2016 Discharge date: 12/07/2016  Admitted From: Nursing home Disposition:  Nursing home   Recommendations for Outpatient Follow-up:  1. Follow up with nursing home provider at earliest convenience 2. Please obtain BMP/CBC in one week 3. Follow-up with Dr. Gorsuch/oncology in 1 week or as scheduled 4. 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 Patient was admitted for possible SBP was placed on antibiotics and IR was consulted for paracentesis. Abdominal ultrasound was performed but there was not a safe window to do paracentesis. Patient was started on Albumin and Lasix with significant improvement. Oncology consulted planning for possible chemo on 12/07/16. Patient will get chemotherapy today and patient is cleared to be discharged afterwards as per oncology.  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 Initially suspected SBP - Given increase in abdominal girth, increasing ascites on CT abd and elevated white count. Initially treated with Cipro,changed to Rocephin; was discharge on 5 more days of ciprofloxacin ; unable to rule out infection but clinically improved  Ascites significantly improved  Paracentesis was ordered but ultrasound did not reveal a safe window for  procedure Patient was started on neoadjuvant chemotherapy on 11/17/2016. She had round 1 of chemo, per oncology team, will have 2nd round of chemo today on 12/07/2016  - Discharge to nursing home today after chemotherapy and outpatient follow-up with oncology - Continue Lasix 40 mg by mouth twice a day - Might benefit from follow-up with palliative care in the nursing home  Anasarca - Significantly improved  Likely to be cancer related with hypoalbuminemia Patient treated with Albumin x 3 doses and IV lasix  Continue IV Lasix for now, having good diuresis. Negative ~7 L  -Continue Lasix 40 mg by mouth twice a day with close follow-up  of BMP  Ovarian cancer stage III See above Oncology recommendations appreciated - for possible chemo today and outpatient follow-up with oncology  Right lower extremity DVT Cancer related  Will continue therapeutic Lovenox for now   Hypokalemia/Hypomagnesemia  Likely from Lasix and poor nutrition  Outpatient follow-up  Anemia of malignancy  Hgb stable  Oncology following  No signs of overt bleeding  Transfuse if Hgb < 7   Severe protein caloric malnutrition Due to overlying cancer Continue diet as per nutrition recommendations  Discharge Instructions  Discharge Instructions    Call MD for:  difficulty breathing, headache or visual disturbances    Complete by:  As directed    Call MD for:  extreme fatigue    Complete by:  As directed    Call MD for:  hives    Complete by:  As directed    Call MD for:  persistant dizziness or light-headedness    Complete by:  As directed    Call MD for:  persistant nausea and vomiting    Complete by:  As directed    Call MD for:  severe uncontrolled pain  Complete by:  As directed    Call MD for:  temperature >100.4    Complete by:  As directed    Diet - low sodium heart healthy    Complete by:  As directed    Diet Carb Modified    Complete by:  As directed    Increase activity slowly     Complete by:  As directed    TREATMENT CONDITIONS    Complete by:  As directed    Patient should have CBC & CMP within 7 days prior to chemotherapy administration. NOTIFY MD IF: ANC < 1500, Hemoglobin < 8, PLT < 100,000,  Total Bili > 1.5, Creatinine > 1.5, ALT & AST > 80 or if patient has unstable vital signs: Temperature > 38.5, SBP > 180 or < 90, RR > 30 or HR > 100.     Allergies as of 12/07/2016      Reactions   Penicillins Nausea Only   Has patient had a PCN reaction causing immediate rash, facial/tongue/throat swelling, SOB or lightheadedness with hypotension: NO Has patient had a PCN reaction causing severe rash involving mucus membranes or skin necrosis: NO Has patient had a PCN reaction that required hospitalization: NO Has patient had a PCN reaction occurring within the last 10 years: NO If all of the above answers are "NO", then may proceed with Cephalosporin use.      Medication List    STOP taking these medications   cephALEXin 500 MG capsule Commonly known as:  KEFLEX   nystatin powder Commonly known as:  MYCOSTATIN/NYSTOP   senna 8.6 MG Tabs tablet Commonly known as:  SENOKOT     TAKE these medications   acetaminophen 325 MG tablet Commonly known as:  TYLENOL Take 2 tablets (650 mg total) by mouth every 6 (six) hours as needed for mild pain (or Fever >/= 101).   albuterol (2.5 MG/3ML) 0.083% nebulizer solution Commonly known as:  PROVENTIL Take 3 mLs (2.5 mg total) by nebulization every 6 (six) hours as needed for wheezing or shortness of breath.   alum & mag hydroxide-simeth 200-200-20 MG/5ML suspension Commonly known as:  MAALOX/MYLANTA Take 30 mLs by mouth every 4 (four) hours as needed for indigestion or heartburn.   bisacodyl 10 MG suppository Commonly known as:  DULCOLAX Place 1 suppository (10 mg total) rectally daily.   ciprofloxacin 500 MG tablet Commonly known as:  CIPRO Take 1 tablet (500 mg total) by mouth 2 (two) times daily.   COLLAGEN  PO Take 6 tablets by mouth daily.   enoxaparin 100 MG/ML injection Commonly known as:  LOVENOX Inject 0.85 mLs (85 mg total) into the skin every 12 (twelve) hours.   feeding supplement Liqd Take 1 Container by mouth 3 (three) times daily with meals. What changed:  when to take this  Another medication with the same name was removed. Continue taking this medication, and follow the directions you see here.   gabapentin 300 MG capsule Commonly known as:  NEURONTIN Take 1 capsule (300 mg total) by mouth at bedtime.   guaiFENesin-dextromethorphan 100-10 MG/5ML syrup Commonly known as:  ROBITUSSIN DM Take 5 mLs by mouth every 4 (four) hours as needed for cough.   insulin aspart 100 UNIT/ML injection Commonly known as:  novoLOG Inject 0-9 Units into the skin 3 (three) times daily with meals.   LASIX 40 MG tablet Generic drug:  furosemide Take 1 tablet (40 mg total) by mouth 2 (two) times daily.   metoprolol succinate 25  MG 24 hr tablet Commonly known as:  TOPROL-XL Take 1 tablet (25 mg total) by mouth daily.   ondansetron 4 MG tablet Commonly known as:  ZOFRAN Take 1 tablet (4 mg total) by mouth daily as needed for nausea or vomiting.   pantoprazole 40 MG tablet Commonly known as:  PROTONIX Take 1 tablet (40 mg total) by mouth daily at 6 (six) AM.   polyethylene glycol packet Commonly known as:  MIRALAX / GLYCOLAX Take 17 g by mouth daily.   potassium chloride SA 20 MEQ tablet Commonly known as:  K-DUR,KLOR-CON Take 1 tablet (20 mEq total) by mouth daily.   ranitidine 75 MG tablet Commonly known as:  ZANTAC Take 75 mg by mouth once.   senna-docusate 8.6-50 MG tablet Commonly known as:  Senokot-S Take 2 tablets by mouth 2 (two) times daily.   traMADol 50 MG tablet Commonly known as:  ULTRAM Take 1 tablet (50 mg total) by mouth every 6 (six) hours as needed for moderate pain.       Follow-up Information    Heath Lark, MD Follow up in 1 week(s).   Specialty:   Hematology and Oncology Why:  or as scheduled Contact information: [place] [address] [phone]        Wardell Honour, MD Follow up in 1 week(s).   Specialty:  Family Medicine Contact information: [address] [place] [address] [phone]          Allergies  Allergen Reactions  . Penicillins Nausea Only    Has patient had a PCN reaction causing immediate rash, facial/tongue/throat swelling, SOB or lightheadedness with hypotension: NO Has patient had a PCN reaction causing severe rash involving mucus membranes or skin necrosis: NO Has patient had a PCN reaction that required hospitalization: NO Has patient had a PCN reaction occurring within the last 10 years: NO If all of the above answers are "NO", then may proceed with Cephalosporin use.     Consultations:  Oncology   Procedures/Studies: Dg Chest 2 View  Result Date: 11/10/2016 CLINICAL DATA:  Chest pain and right lower extremity DVT EXAM: CHEST  2 VIEW COMPARISON:  Chest radiograph 08/30/2015 FINDINGS: Shallow lung inflation without focal airspace disease or pulmonary edema. No pleural effusion or pneumothorax. Unchanged cardiomediastinal contours. IMPRESSION: No active cardiopulmonary disease. Electronically Signed   By: Ulyses Jarred M.D.   On: 11/10/2016 18:25   Dg Abd 1 View  Result Date: 11/18/2016 CLINICAL DATA:  Followup ileus or partial small bowel obstruction. Ovarian cancer. EXAM: ABDOMEN - 1 VIEW COMPARISON:  11/15/2016. FINDINGS: Normal bowel gas pattern.  Unremarkable bones. IMPRESSION: No acute abnormality. Electronically Signed   By: Claudie Revering M.D.   On: 11/18/2016 15:02   Dg Abd 1 View  Result Date: 11/15/2016 CLINICAL DATA:  Abdominal distention, back pain, history of SP of EXAM: ABDOMEN - 1 VIEW COMPARISON:  Abdomen film of 11/14/2016 and CT abdomen pelvis of 11/10/2016 FINDINGS: Compared to the most recent KUB, there is less gaseous distention of  large and small bowel. There is some gaseous distention of the stomach. No small bowel obstruction is evident. A moderate amount of feces is noted in the rectosigmoid colon. No opaque calculi are seen. The bones are unremarkable. IMPRESSION: 1. No present evidence of small-bowel obstruction. Very little gaseous distention of large or small bowel. 2. No opaque calculi . Electronically Signed   By: Ivar Drape M.D.   On: 11/15/2016 08:45   Ct Angio Chest  Pe W And/or Wo Contrast  Result Date: 11/10/2016 CLINICAL DATA:  Shortness of breath, abdominal pain, right lower extremity DVT EXAM: CT ANGIOGRAPHY CHEST CT ABDOMEN AND PELVIS WITH CONTRAST TECHNIQUE: Multidetector CT imaging of the chest was performed using the standard protocol during bolus administration of intravenous contrast. Multiplanar CT image reconstructions and MIPs were obtained to evaluate the vascular anatomy. Multidetector CT imaging of the abdomen and pelvis was performed using the standard protocol during bolus administration of intravenous contrast. CONTRAST:  100 mL Isovue 370 IV COMPARISON:  Chest radiograph 12/24/2003 and 11/10/2016 FINDINGS: CTA CHEST FINDINGS Cardiovascular: Contrast injection is sufficient to demonstrate satisfactory opacification of the pulmonary arteries to the segmental level. There is no pulmonary embolus. The main pulmonary artery is within normal limits for size. There is no CT evidence of acute right heart strain. There is calcific aortic atherosclerosis. There are multifocal coronary artery calcifications. There is a normal 3-vessel arch branching pattern. Heart size is normal, without pericardial effusion. Mediastinum/Nodes: No mediastinal, hilar or axillary lymphadenopathy. The visualized thyroid and thoracic esophageal course are unremarkable. Lungs/Pleura: No pulmonary nodules or masses. No pleural effusion or pneumothorax. No focal airspace consolidation. No focal pleural abnormality. Musculoskeletal: No chest  wall abnormality. No acute or significant osseous findings. Review of the MIP images confirms the above findings. CT ABDOMEN and PELVIS FINDINGS Hepatobiliary: Moderate volume perihepatic ascites. No focal liver lesion. The gallbladder is filled with stones. Pancreas: Normal pancreatic contours and enhancement. No peripancreatic fluid collection or pancreatic ductal dilatation. Spleen: Medium volume perisplenic ascites. There is peritoneal nodularity particularly in the left upper quadrant and lower midline abdomen. Adrenals/Urinary Tract: Normal adrenal glands. No hydronephrosis or solid renal mass. Stomach/Bowel: There is no hiatal hernia. The stomach and duodenum are normal. There is no dilated small bowel or enteric inflammation. There is no colonic abnormality. Nonvisualization of the appendix. Vascular/Lymphatic: There is atherosclerotic calcification of the non aneurysmal abdominal aorta. Aortocaval lymph nodes measure up to 8 mm. Reproductive: There is a multilobulated mass of the right pelvis. It is unclear whether it arises from the uterine fundus to the right adnexa. There is a left ovarian cyst measuring 4 cm. Musculoskeletal: No lytic or blastic osseous lesion. Normal visualized extrathoracic and extraperitoneal soft tissues. Other: No contributory non-categorized findings. IMPRESSION: 1. No pulmonary embolus. 2. Extensive anterior peritoneal nodular implants, consistent with carcinomatosis. Perihepatic and perisplenic ascites is likely malignant. 3. Lobulated right pelvic mass arising from either the right adnexa or the uterine fundus. This may be the primary malignancy. 4. Gallbladder filled stones but no other evidence of acute cholecystitis. 5. Multiple subcentimeter retroperitoneal nodes. In this context, these could indicate lymphatic spread of disease. Electronically Signed   By: Ulyses Jarred M.D.   On: 11/10/2016 19:29   Ct Abdomen Pelvis W Contrast  Result Date: 12/02/2016 CLINICAL DATA:   70 y/o F; r/o sbo, Hx of ovarian CA, now w n/v, abd discomfort. EXAM: CT ABDOMEN AND PELVIS WITH CONTRAST TECHNIQUE: Multidetector CT imaging of the abdomen and pelvis was performed using the standard protocol following bolus administration of intravenous contrast. CONTRAST:  148mL ISOVUE-300 IOPAMIDOL (ISOVUE-300) INJECTION 61% COMPARISON:  11/12/2014 CT abdomen and pelvis. FINDINGS: Lower chest: Coronary artery calcifications. Small bilateral pleural effusions. Hepatobiliary: Hepatic steatosis. Cholelithiasis. No intra or extrahepatic biliary ductal dilatation. Pancreas: Unremarkable. No pancreatic ductal dilatation or surrounding inflammatory changes. Spleen: Normal in size without focal abnormality. Adrenals/Urinary Tract: Adrenal glands are unremarkable. Kidneys are normal, without renal calculi, focal lesion, or hydronephrosis. Bladder is  unremarkable. Stomach/Bowel: No obstructive or inflammatory changes of bowel. Large volume of stool in rectum, question constipation. Vascular/Lymphatic: Aortic atherosclerosis. No enlarged abdominal or pelvic lymph nodes. Reproductive: Stable 4 cm left ovarian cyst. Stable right adnexal mass measuring 59 x 51 x 36 mm (AP x ML x CC series 2, image 71 and series 3, image 62). Other: Moderate volume of peritoneal ascites is increased from prior CT of abdomen and pelvis. Nodular thickening within the omentum is decreased from the prior CT. There is persistent peritoneal carcinomatosis within omentum and pericolic gutters with small foci of enhancement. Increased anasarca within the abdominal wall. Musculoskeletal: No acute or significant osseous findings. IMPRESSION: 1. Moderate volume of ascites increased from prior CT and increased anasarca. 2. Omental and peritoneal carcinomatosis is mildly decreased. 3. Stable right adnexal mass measuring up to 5.9 cm. 4. Hepatic steatosis.  Cholelithiasis. 5. Small bilateral pleural effusions. 6. Large volume of stool in rectum, question  constipation. Electronically Signed   By: Kristine Garbe M.D.   On: 12/02/2016 19:35   Ct Abdomen Pelvis W Contrast  Result Date: 11/10/2016 CLINICAL DATA:  Shortness of breath, abdominal pain, right lower extremity DVT EXAM: CT ANGIOGRAPHY CHEST CT ABDOMEN AND PELVIS WITH CONTRAST TECHNIQUE: Multidetector CT imaging of the chest was performed using the standard protocol during bolus administration of intravenous contrast. Multiplanar CT image reconstructions and MIPs were obtained to evaluate the vascular anatomy. Multidetector CT imaging of the abdomen and pelvis was performed using the standard protocol during bolus administration of intravenous contrast. CONTRAST:  100 mL Isovue 370 IV COMPARISON:  Chest radiograph 12/24/2003 and 11/10/2016 FINDINGS: CTA CHEST FINDINGS Cardiovascular: Contrast injection is sufficient to demonstrate satisfactory opacification of the pulmonary arteries to the segmental level. There is no pulmonary embolus. The main pulmonary artery is within normal limits for size. There is no CT evidence of acute right heart strain. There is calcific aortic atherosclerosis. There are multifocal coronary artery calcifications. There is a normal 3-vessel arch branching pattern. Heart size is normal, without pericardial effusion. Mediastinum/Nodes: No mediastinal, hilar or axillary lymphadenopathy. The visualized thyroid and thoracic esophageal course are unremarkable. Lungs/Pleura: No pulmonary nodules or masses. No pleural effusion or pneumothorax. No focal airspace consolidation. No focal pleural abnormality. Musculoskeletal: No chest wall abnormality. No acute or significant osseous findings. Review of the MIP images confirms the above findings. CT ABDOMEN and PELVIS FINDINGS Hepatobiliary: Moderate volume perihepatic ascites. No focal liver lesion. The gallbladder is filled with stones. Pancreas: Normal pancreatic contours and enhancement. No peripancreatic fluid collection or  pancreatic ductal dilatation. Spleen: Medium volume perisplenic ascites. There is peritoneal nodularity particularly in the left upper quadrant and lower midline abdomen. Adrenals/Urinary Tract: Normal adrenal glands. No hydronephrosis or solid renal mass. Stomach/Bowel: There is no hiatal hernia. The stomach and duodenum are normal. There is no dilated small bowel or enteric inflammation. There is no colonic abnormality. Nonvisualization of the appendix. Vascular/Lymphatic: There is atherosclerotic calcification of the non aneurysmal abdominal aorta. Aortocaval lymph nodes measure up to 8 mm. Reproductive: There is a multilobulated mass of the right pelvis. It is unclear whether it arises from the uterine fundus to the right adnexa. There is a left ovarian cyst measuring 4 cm. Musculoskeletal: No lytic or blastic osseous lesion. Normal visualized extrathoracic and extraperitoneal soft tissues. Other: No contributory non-categorized findings. IMPRESSION: 1. No pulmonary embolus. 2. Extensive anterior peritoneal nodular implants, consistent with carcinomatosis. Perihepatic and perisplenic ascites is likely malignant. 3. Lobulated right pelvic mass arising from either the  right adnexa or the uterine fundus. This may be the primary malignancy. 4. Gallbladder filled stones but no other evidence of acute cholecystitis. 5. Multiple subcentimeter retroperitoneal nodes. In this context, these could indicate lymphatic spread of disease. Electronically Signed   By: Ulyses Jarred M.D.   On: 11/10/2016 19:29   US Abdomen Limited  Result Date: 12/05/2016 CLINICAL DATA:  Evaluate for abdominal ascites and perform ultrasound-guided paracentesis as indicated. EXAM: LIMITED ABDOMEN ULTRASOUND FOR ASCITES TECHNIQUE: Limited ultrasound survey for ascites was performed in all four abdominal quadrants. COMPARISON:  Ascites search ultrasound - 12/03/2016; CT abdomen pelvis - 12/02/2016 FINDINGS: Four quadrant grayscale images are  provided of the abdomen and demonstrates only a trace to small amount of peritoneal fluid with largest collection with the left upper abdominal quadrant, similar to abdominal CT performed 12/02/2016 and too small to allow for safe ultrasound-guided paracentesis. IMPRESSION: Trace to small amount of intra-abdominal ascites, too small to allow for safe ultrasound-guided paracentesis. Electronically Signed   By: Sandi Mariscal M.D.   On: 12/05/2016 10:59   US Abdomen Limited  Result Date: 12/03/2016 CLINICAL DATA:  Ascites. EXAM: LIMITED ABDOMEN ULTRASOUND FOR ASCITES TECHNIQUE: Limited ultrasound survey for ascites was performed in all four abdominal quadrants. COMPARISON:  Abdomen and pelvis CT dated 12/02/2016. FINDINGS: Small moderate amount of free peritoneal fluid. IMPRESSION: Small to moderate amount of ascites. Electronically Signed   By: Claudie Revering M.D.   On: 12/03/2016 10:13   US Abdomen Limited  Result Date: 11/20/2016 CLINICAL DATA:  Gyn malignancy with carcinomatosis. Abdominal distention and malignant small-bowel obstruction. Request is made for evaluations verbal paracentesis. EXAM: LIMITED ABDOMEN ULTRASOUND FOR ASCITES TECHNIQUE: Limited ultrasound survey for ascites was performed in all four abdominal quadrants. COMPARISON:  None. FINDINGS: All 4 quadrants evaluated and minimal ascites is noted. IMPRESSION: Not enough fluid for paracentesis. Read by: Saverio Danker, PA-C Electronically Signed   By: Markus Daft M.D.   On: 11/20/2016 13:05   Ir US Guide Vasc Access Left  Result Date: 11/25/2016 INDICATION: 71 year old with peritoneal carcinomatosis. Port-A-Cath needed for treatment. The patient's white blood cell count is markedly low and abnormal. This was discussed with the patient's oncologist, Dr. Alvy Bimler, who felt the patient was safe for Port-A-Cath placement. EXAM: FLUOROSCOPIC AND ULTRASOUND GUIDED PLACEMENT OF A SUBCUTANEOUS PORT. Physician: Stephan Minister. Anselm Pancoast, MD MEDICATIONS: Cleocin 600 mg  administered intravenously within one hour of incision. ANESTHESIA/SEDATION: Versed 1.0 mg IV; Fentanyl 25 mcg IV; Moderate Sedation Time:  40 minutes The patient was continuously monitored during the procedure by the interventional radiology nurse under my direct supervision. FLUOROSCOPY TIME:  54 seconds, 7 mGy COMPLICATIONS: None immediate. PROCEDURE: The risks of the procedure were explained to the patient. Informed consent was obtained. Patient was placed supine on the interventional table. Ultrasound confirmed a patent left internal jugular vein. The left chest and neck were cleaned with a skin antiseptic and a sterile drape was placed. Maximal barrier sterile technique was utilized including caps, mask, sterile gowns, sterile gloves, sterile drape, hand hygiene and skin antiseptic. The left neck was anesthetized with 1% lidocaine. Small incision was made in the left neck with a blade. Micropuncture set was placed in the left IJ with ultrasound guidance. The left chest was anesthetized with 1% lidocaine with epinephrine. #15 blade was used to make an incision and a subcutaneous port pocket was formed. [place] was selected. Subcutaneous tunnel was formed with a stiff tunneling device. The port catheter was brought through the  subcutaneous tunnel. The micropuncture set was exchanged for a peel-away sheath. The catheter was placed through the peel-away sheath and the tip was positioned at the superior cavoatrial junction. Catheter placement was confirmed with fluoroscopy. The port was assembled and placed within the subcutaneous pocket. The port was accessed and flushed with heparinized saline. The port pocket was closed using two layers of absorbable sutures and Dermabond. The vein skin site was closed using a single layer of absorbable suture and Dermabond. Sterile dressings were applied. Patient tolerated the procedure well without an immediate complication. Ultrasound and fluoroscopic images were  taken and saved for this procedure. IMPRESSION: Placement of a subcutaneous port device. Electronically Signed   By: Markus Daft M.D.   On: 11/25/2016 15:53   Dg Abd Portable 1v-small Bowel Obstruction Protocol-initial, 8 Hr Delay  Result Date: 11/20/2016 CLINICAL DATA:  71 year old female 8 hours status post enteric contrast via NG tube. EXAM: PORTABLE ABDOMEN - 1 VIEW COMPARISON:  1130 hours today, and earlier. FINDINGS: Portable AP supine view at 2123 hours. Stable enteric tube, side hole up the level of the gastric fundus. Oral contrast present throughout the right colon to the mid transverse colon. No dilated small or large bowel loops. Some retained stool in the colon. No acute osseous abnormality identified. IMPRESSION: 1. Enteric contrast has reached the mid transverse colon. No dilated small or large bowel loops. 2. Stable enteric tube. Electronically Signed   By: Genevie Ann M.D.   On: 11/20/2016 21:47   Dg Abd Portable 1v-small Bowel Protocol-position Verification  Result Date: 11/20/2016 CLINICAL DATA:  NG tube placement EXAM: PORTABLE ABDOMEN - 1 VIEW COMPARISON:  11/18/2016 FINDINGS: NG tube is in the stomach. Nonobstructive bowel gas pattern. No free air organomegaly. IMPRESSION: NG tube tip in the stomach. Electronically Signed   By: Rolm Baptise M.D.   On: 11/20/2016 11:47   Dg Abd Portable 1v  Result Date: 11/14/2016 CLINICAL DATA:  Small bowel obstruction, intra-abdominal ascites an malignancy. EXAM: PORTABLE ABDOMEN - 1 VIEW COMPARISON:  Abdominal and pelvic CT scan of November 10, 2016 FINDINGS: There are loops of mildly distended gas-filled small bowel present. There is stool and gas within normal caliber colon and rectum. No free extraluminal gas collections are observed. IMPRESSION: The bowel gas pattern is consistent with small bowel ileus or partial mid to distal small bowel obstruction. Electronically Signed   By: David  Martinique M.D.   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 Port-A-Cath needed for treatment. The patient's white blood cell count is markedly low and abnormal. This was discussed with the patient's oncologist, Dr. Alvy Bimler, who felt the patient was safe for Port-A-Cath placement. EXAM: FLUOROSCOPIC AND ULTRASOUND GUIDED PLACEMENT OF A SUBCUTANEOUS PORT. Physician: Stephan Minister. Anselm Pancoast, MD MEDICATIONS: Cleocin 600 mg administered intravenously within one hour of incision. ANESTHESIA/SEDATION: Versed 1.0 mg IV; Fentanyl 25 mcg IV; Moderate Sedation Time:  40 minutes The patient was continuously monitored during the procedure by the interventional radiology nurse under my direct supervision. FLUOROSCOPY TIME:  54 seconds, 7 mGy COMPLICATIONS: None immediate. PROCEDURE: The risks of the procedure were explained to the patient. Informed consent was obtained. Patient was placed supine on the interventional table. Ultrasound confirmed a patent left internal jugular vein. The left chest and neck were cleaned with a skin antiseptic and a sterile drape was placed. Maximal barrier sterile technique was utilized including caps, mask, sterile gowns, sterile gloves, sterile  drape, hand hygiene and skin antiseptic. The left neck was anesthetized with 1% lidocaine. Small incision was made in the left neck with a blade. Micropuncture set was placed in the left IJ with ultrasound guidance. The left chest was anesthetized with 1% lidocaine with epinephrine. #15 blade was used to make an incision and a subcutaneous port pocket was formed. [place] was selected. Subcutaneous tunnel was formed with a stiff tunneling device. The port catheter was brought through the subcutaneous tunnel. The micropuncture set was exchanged for a peel-away sheath. The catheter was placed through the peel-away sheath and the tip was positioned at the superior cavoatrial junction. Catheter placement was confirmed with  fluoroscopy. The port was assembled and placed within the subcutaneous pocket. The port was accessed and flushed with heparinized saline. The port pocket was closed using two layers of absorbable sutures and Dermabond. The vein skin site was closed using a single layer of absorbable suture and Dermabond. Sterile dressings were applied. Patient tolerated the procedure well without an immediate complication. Ultrasound and fluoroscopic images were taken and saved for this procedure. IMPRESSION: Placement of a subcutaneous port device. Electronically Signed   By: Markus Daft M.D.   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 MEDICATIONS: 10 cc 1% lidocaine. COMPLICATIONS: None immediate. TECHNIQUE: Informed written consent was obtained from the patient after a discussion of the risks, benefits and alternatives to treatment. A timeout was performed prior to the initiation of the procedure. Initial ultrasound scanning demonstrates a small amount of ascites within the right upper abdominal quadrant. The right upper abdomen was prepped and draped in the usual sterile fashion. 1% lidocaine with epinephrine was used for local anesthesia. Under direct ultrasound guidance, a 19 gauge, 7-cm, Yueh catheter was introduced. An ultrasound image was saved for documentation purposed. The paracentesis was performed. The catheter was removed and a dressing was applied. The patient tolerated the procedure well without immediate post procedural complication. FINDINGS: A total of approximately 420 cc of dark yellow fluid was removed. Samples were sent to the laboratory as requested by the clinical team. IMPRESSION: Successful ultrasound-guided paracentesis yielding 420 cc of peritoneal fluid. Read by Lavonia Drafts [facility] Electronically Signed   By: Jacqulynn Cadet M.D.   On: 11/11/2016 15:52       Subjective: Patient seen and examined at bedside. She denies any overnight fever, nausea or vomiting.  Discharge Exam: Vitals:   12/06/16 2142 12/07/16 0510  BP: 130/86 123/73  Pulse: 76 81  Resp: 17 16  Temp: 98 F (36.7 C) 98.8 F (37.1 C)   Vitals:   12/06/16 0428 12/06/16 1450 12/06/16 2142 12/07/16 0510  BP: 109/76 110/78 130/86 123/73  Pulse: 87 84 76 81  Resp: 16 20 17 16   Temp: 98.6 F (37 C) 98 F (36.7 C) 98 F (36.7 C) 98.8 F (37.1 C)  TempSrc: Oral Oral Oral Oral  SpO2: 99% 98% 98% 98%  Weight:  80.7 kg (177 lb 14.4 oz)    Height:        General: Pt is alert, awake, not in acute distress Cardiovascular: Rate controlled, S1/S2 + Respiratory: Bilateral decreased breath sounds at bases with some scattered crackles Abdominal: Soft, NT, distended, bowel sounds + Extremities: 1+ pitting edema, no cyanosis    The results of significant diagnostics from this hospitalization (including imaging, microbiology, ancillary and laboratory) are listed below for reference.  Microbiology: No results found for this or any previous visit (from the past 240 hour(s)).   Labs: BNP (last 3 results) No results for input(s): BNP in the last 8760 hours. Basic Metabolic Panel:  Recent Labs Lab 12/03/16 0821 12/04/16 0430 12/05/16 0500 12/06/16 0500 12/07/16 0100  NA 136 137 133* 133* 132*  K 3.2* 3.1* 3.1* 3.3* 4.2  CL 100* 99* 93* 94* 92*  CO2 33* 34* 36* 35* 33*  GLUCOSE 106* 129* 110* 97 176*  BUN 11 10 8 7 8   CREATININE 0.58 0.53 0.54 0.54 0.58  CALCIUM 7.5* 7.6* 7.5* 7.3* 7.8*  MG  --   --  1.5* 1.7  --    Liver Function Tests:  Recent Labs Lab 12/02/16 1628 12/03/16 0821 12/04/16 0430 12/05/16 0500 12/06/16 0500  AST 43* 40 42*  --   --   ALT 18 18 19   --   --   ALKPHOS 168* 154* 128*  --   --   BILITOT 0.1* 0.5 0.3  --   --   PROT 4.6* 4.4* 4.4*  --   --   ALBUMIN 1.8* 1.8* 2.0* 2.0* 2.2*    Recent Labs Lab 12/02/16 1628  LIPASE  11   No results for input(s): AMMONIA in the last 168 hours. CBC:  Recent Labs Lab 12/02/16 1628 12/03/16 0821 12/04/16 0430 12/05/16 0500 12/06/16 0500 12/07/16 0100  WBC 24.9* 17.0* 12.8* 9.3 7.7 10.0  NEUTROABS 23.2*  --  11.2*  --  6.0 9.5*  HGB 9.9* 9.5* 8.8* 8.4* 8.7* 9.6*  HCT 29.6* 29.1* 26.5* 25.7* 26.2* 29.4*  MCV 89.4 90.4 89.8 88.6 88.8 90.2  PLT 207 203 194 190 209 242   Cardiac Enzymes: No results for input(s): CKTOTAL, CKMB, CKMBINDEX, TROPONINI in the last 168 hours. BNP: Invalid input(s): POCBNP CBG:  Recent Labs Lab 12/03/16 2001 12/04/16 0808 12/04/16 1212 12/04/16 1657 12/04/16 1945  GLUCAP 101* 108* 98 134* 119*   D-Dimer No results for input(s): DDIMER in the last 72 hours. Hgb A1c No results for input(s): HGBA1C in the last 72 hours. Lipid Profile No results for input(s): CHOL, HDL, LDLCALC, TRIG, CHOLHDL, LDLDIRECT in the last 72 hours. Thyroid function studies No results for input(s): TSH, T4TOTAL, T3FREE, THYROIDAB in the last 72 hours.  Invalid input(s): FREET3 Anemia work up No results for input(s): VITAMINB12, FOLATE, FERRITIN, TIBC, IRON, RETICCTPCT in the last 72 hours. Urinalysis    Component Value Date/Time   COLORURINE YELLOW 12/02/2016 1643   APPEARANCEUR HAZY (A) 12/02/2016 1643   LABSPEC 1.028 12/02/2016 1643   PHURINE 5.0 12/02/2016 1643   GLUCOSEU 50 (A) 12/02/2016 1643   HGBUR SMALL (A) 12/02/2016 1643   BILIRUBINUR NEGATIVE 12/02/2016 1643   BILIRUBINUR negative 01/27/2016 1759   BILIRUBINUR neg 04/08/2012 1424   KETONESUR 20 (A) 12/02/2016 1643   PROTEINUR 30 (A) 12/02/2016 1643   UROBILINOGEN 0.2 01/27/2016 1759   UROBILINOGEN 1.0 01/06/2011 0008   NITRITE NEGATIVE 12/02/2016 1643   LEUKOCYTESUR NEGATIVE 12/02/2016 1643   Sepsis Labs Invalid input(s): PROCALCITONIN,  WBC,  LACTICIDVEN Microbiology No results found for this or any previous visit (from the past 240 hour(s)).   Time coordinating discharge:  35 minutes  SIGNED:   Aline August, MD  [facility] 12/07/2016, 11:17 AM Pager: [phone]  If 7PM-7AM, please contact night-coverage [email or web address] Password [email or web address]

## 2016-12-09 ENCOUNTER — Ambulatory Visit: Payer: Medicare Other

## 2016-12-12 ENCOUNTER — Telehealth: Payer: Self-pay | Admitting: Hematology and Oncology

## 2016-12-12 ENCOUNTER — Telehealth: Payer: Self-pay | Admitting: *Deleted

## 2016-12-12 NOTE — Telephone Encounter (Signed)
Called [facility] to make sure transportation is arranged for 7/24 appts at [facility].

## 2016-12-12 NOTE — Telephone Encounter (Signed)
PLEASE TO NOT CANCEL ANYTHING.... PATIENT WILL BE HERE TOMORROW FOR APPOINTMENTS

## 2016-12-12 NOTE — Telephone Encounter (Signed)
I would cancel chemo class for now, move labs to 11 am Would that work?

## 2016-12-12 NOTE — Telephone Encounter (Signed)
-----   Message from Heath Lark, MD sent at 12/11/2016  1:29 PM EDT ----- Regarding: SNF SHe is currently in SNF Please call and make sure they can arrange transport for her appt on Tues

## 2016-12-12 NOTE — Telephone Encounter (Signed)
Christy from the facility called to say that they do not have transportation for the patient that early in the morning.  They patient is not able to ride in a regular wheel chair and [facility] is not able to pick patient up due to other appointments.  They need to reschedule appointments

## 2016-12-13 ENCOUNTER — Other Ambulatory Visit (HOSPITAL_BASED_OUTPATIENT_CLINIC_OR_DEPARTMENT_OTHER): Payer: Medicare PPO

## 2016-12-13 ENCOUNTER — Ambulatory Visit (HOSPITAL_BASED_OUTPATIENT_CLINIC_OR_DEPARTMENT_OTHER): Payer: Medicare PPO | Admitting: Hematology and Oncology

## 2016-12-13 ENCOUNTER — Ambulatory Visit: Payer: Medicare PPO

## 2016-12-13 ENCOUNTER — Ambulatory Visit (HOSPITAL_BASED_OUTPATIENT_CLINIC_OR_DEPARTMENT_OTHER): Payer: Medicare PPO

## 2016-12-13 ENCOUNTER — Encounter: Payer: Self-pay | Admitting: Hematology and Oncology

## 2016-12-13 ENCOUNTER — Other Ambulatory Visit: Payer: Medicare PPO

## 2016-12-13 VITALS — BP 80/63 | HR 105 | Resp 16 | Ht 61.5 in

## 2016-12-13 DIAGNOSIS — I82401 Acute embolism and thrombosis of unspecified deep veins of right lower extremity: Secondary | ICD-10-CM

## 2016-12-13 DIAGNOSIS — R627 Adult failure to thrive: Secondary | ICD-10-CM | POA: Insufficient documentation

## 2016-12-13 DIAGNOSIS — Z66 Do not resuscitate: Secondary | ICD-10-CM

## 2016-12-13 DIAGNOSIS — C561 Malignant neoplasm of right ovary: Secondary | ICD-10-CM

## 2016-12-13 DIAGNOSIS — E43 Unspecified severe protein-calorie malnutrition: Secondary | ICD-10-CM

## 2016-12-13 DIAGNOSIS — Z95828 Presence of other vascular implants and grafts: Secondary | ICD-10-CM

## 2016-12-13 DIAGNOSIS — Z5189 Encounter for other specified aftercare: Secondary | ICD-10-CM

## 2016-12-13 DIAGNOSIS — C799 Secondary malignant neoplasm of unspecified site: Secondary | ICD-10-CM

## 2016-12-13 LAB — COMPREHENSIVE METABOLIC PANEL
ALBUMIN: 2.8 g/dL — AB (ref 3.5–5.0)
ALK PHOS: 140 U/L (ref 40–150)
ALT: 53 U/L (ref 0–55)
ANION GAP: 13 meq/L — AB (ref 3–11)
AST: 70 U/L — AB (ref 5–34)
BUN: 16.7 mg/dL (ref 7.0–26.0)
CALCIUM: 8.5 mg/dL (ref 8.4–10.4)
CHLORIDE: 98 meq/L (ref 98–109)
CO2: 22 mEq/L (ref 22–29)
Creatinine: 0.9 mg/dL (ref 0.6–1.1)
EGFR: 63 mL/min/{1.73_m2} — AB (ref >90)
Glucose: 190 mg/dl — ABNORMAL HIGH (ref 70–140)
POTASSIUM: 4.2 meq/L (ref 3.5–5.1)
Sodium: 133 mEq/L — ABNORMAL LOW (ref 136–145)
Total Bilirubin: 0.8 mg/dL (ref 0.20–1.20)
Total Protein: 6 g/dL — ABNORMAL LOW (ref 6.4–8.3)

## 2016-12-13 LAB — CBC WITH DIFFERENTIAL/PLATELET
BASO%: 0.3 % (ref 0.0–2.0)
BASOS ABS: 0 10*3/uL (ref 0.0–0.1)
EOS ABS: 0 10*3/uL (ref 0.0–0.5)
EOS%: 0.3 % (ref 0.0–7.0)
HEMATOCRIT: 26.4 % — AB (ref 34.8–46.6)
HGB: 8.7 g/dL — ABNORMAL LOW (ref 11.6–15.9)
LYMPH#: 0.2 10*3/uL — AB (ref 0.9–3.3)
LYMPH%: 7.4 % — ABNORMAL LOW (ref 14.0–49.7)
MCH: 29.9 pg (ref 25.1–34.0)
MCHC: 33 g/dL (ref 31.5–36.0)
MCV: 90.7 fL (ref 79.5–101.0)
MONO#: 0 10*3/uL — ABNORMAL LOW (ref 0.1–0.9)
MONO%: 1.3 % (ref 0.0–14.0)
NEUT#: 2.3 10*3/uL (ref 1.5–6.5)
NEUT%: 90.7 % — AB (ref 38.4–76.8)
Platelets: 225 10*3/uL (ref 145–400)
RBC: 2.91 10*6/uL — ABNORMAL LOW (ref 3.70–5.45)
RDW: 15.5 % — ABNORMAL HIGH (ref 11.2–14.5)
WBC: 2.6 10*3/uL — ABNORMAL LOW (ref 3.9–10.3)

## 2016-12-13 MED ORDER — PEGFILGRASTIM INJECTION 6 MG/0.6ML ~~LOC~~
6.0000 mg | PREFILLED_SYRINGE | Freq: Once | SUBCUTANEOUS | Status: AC
  Administered 2016-12-13: 6 mg via SUBCUTANEOUS
  Filled 2016-12-13: qty 0.6

## 2016-12-13 MED ORDER — HEPARIN SOD (PORK) LOCK FLUSH 100 UNIT/ML IV SOLN
500.0000 [IU] | Freq: Once | INTRAVENOUS | Status: AC
  Administered 2016-12-13: 500 [IU] via INTRAVENOUS
  Filled 2016-12-13: qty 5

## 2016-12-13 MED ORDER — ONDANSETRON 8 MG PO TBDP
8.0000 mg | ORAL_TABLET | Freq: Once | ORAL | Status: AC
  Administered 2016-12-13: 8 mg via ORAL
  Filled 2016-12-13: qty 1

## 2016-12-13 MED ORDER — METOCLOPRAMIDE HCL 10 MG PO TABS: 5.0000 mg | ORAL_TABLET | Freq: Three times a day (TID) | ORAL | 0 refills | Status: AC

## 2016-12-13 MED ORDER — SODIUM CHLORIDE 0.9% FLUSH
10.0000 mL | INTRAVENOUS | Status: AC | PRN
  Administered 2016-12-13: 10 mL via INTRAVENOUS
  Filled 2016-12-13: qty 10

## 2016-12-13 NOTE — Progress Notes (Signed)
[place] OFFICE PROGRESS NOTE  Patient Care Team: Wardell Honour, MD as PCP - General (Family Medicine)  SUMMARY OF ONCOLOGIC HISTORY:   Ovarian cancer on right [facility]): Stage 3C   11/10/2016 Imaging    1. No pulmonary embolus. 2. Extensive anterior peritoneal nodular implants, consistent with carcinomatosis. Perihepatic and perisplenic ascites is likely malignant. 3. Lobulated right pelvic mass arising from either the right adnexa or the uterine fundus. This may be the primary malignancy. 4. Gallbladder filled stones but no other evidence of acute cholecystitis. 5. Multiple subcentimeter retroperitoneal nodes. In this context, these could indicate lymphatic spread of disease.      11/10/2016 - 11/29/2016 Hospital Admission    She was admitted to the hospital due to fall, DVT, ovarian cancer and debility      11/11/2016 Pathology Results    PERITONEAL/ASCITIC FLUID (SPECIMEN 1 OF 1, COLLECTED ON 11/11/2016): MALIGNANT CELLS CONSISTENT WITH ADENOCARCINOMA, SEE COMMENT.      11/11/2016 Tumor Marker    Patient's tumor was tested for the following markers: CA-125 Results of the tumor marker test revealed 967.8      11/17/2016 -  Chemotherapy    She received carboplatin & Taxol      11/20/2016 Imaging    Not enough fluid for paracentesis      11/25/2016 Procedure    Placement of a subcutaneous port device      12/02/2016 Imaging    1. Moderate volume of ascites increased from prior CT and increased anasarca. 2. Omental and peritoneal carcinomatosis is mildly decreased. 3. Stable right adnexal mass measuring up to 5.9 cm. 4. Hepatic steatosis.  Cholelithiasis. 5. Small bilateral pleural effusions. 6. Large volume of stool in rectum, question constipation.       12/02/2016 - 12/07/2016 Hospital Admission    She was admitted to the hospital with nausea, vomiting and failure to thrive.  She received second dose of chemotherapy while hospitalized.      12/02/2016  Imaging    Ct scan of abdomen and pelvis 1. Moderate volume of ascites increased from prior CT and increased anasarca. 2. Omental and peritoneal carcinomatosis is mildly decreased. 3. Stable right adnexal mass measuring up to 5.9 cm. 4. Hepatic steatosis.  Cholelithiasis. 5. Small bilateral pleural effusions. 6. Large volume of stool in rectum, question constipa      12/03/2016 Imaging    US abdomen: Small to moderate amount of ascites       INTERVAL HISTORY: Please see below for problem oriented charting. She returns to the clinic for further follow-up She is miserable She is somewhat angry because she has to wake up early to attend her appointment She feels miserable She stated that she has been having significant nausea and vomiting on a regular basis When I asked her if she has tried Zofran or other form of anti-emetics she does not know She stated that on some days she would not be having nausea & vomiting on a regular basis She stated that she has regular bowel movement She denies pain She stated she has been bleeding every day in her urine or stool When asked further, she stated she has been incontinent She does not look when her diaper is changed and actually cannot verify for sure that she has active bleeding When I asked her about physical therapy and rehab, she laughed She cannot remember how much time she participate, if at all, physical therapy She does want to go back to sleep and she  does not want to come back in the future early in the day for appointments  REVIEW OF SYSTEMS:   Constitutional: Denies fevers, chills or abnormal weight loss Eyes: Denies blurriness of vision Ears, nose, mouth, throat, and face: Denies mucositis or sore throat Respiratory: Denies cough, dyspnea or wheezes Cardiovascular: Denies palpitation, chest discomfort or lower extremity swelling Skin: Denies abnormal skin rashes Lymphatics: Denies new lymphadenopathy Behavioral/Psych: Mood  is stable, no new changes  All other systems were reviewed with the patient and are negative.  I have reviewed the past medical history, past surgical history, social history and family history with the patient and they are unchanged from previous note.  ALLERGIES:  is allergic to penicillins.  MEDICATIONS:  Current Outpatient Prescriptions  Medication Sig Dispense Refill  . acetaminophen (TYLENOL) 325 MG tablet Take 2 tablets (650 mg total) by mouth every 6 (six) hours as needed for mild pain (or Fever >/= 101). 30 tablet 0  . albuterol (PROVENTIL) (2.5 MG/3ML) 0.083% nebulizer solution Take 3 mLs (2.5 mg total) by nebulization every 6 (six) hours as needed for wheezing or shortness of breath. 75 mL 12  . alum & mag hydroxide-simeth (MAALOX/MYLANTA) 200-200-20 MG/5ML suspension Take 30 mLs by mouth every 4 (four) hours as needed for indigestion or heartburn. 355 mL 0  . bisacodyl (DULCOLAX) 10 MG suppository Place 1 suppository (10 mg total) rectally daily. 12 suppository 0  . COLLAGEN PO Take 6 tablets by mouth daily.    Marland Kitchen enoxaparin (LOVENOX) 100 MG/ML injection Inject 0.85 mLs (85 mg total) into the skin every 12 (twelve) hours. 60 Syringe 0  . feeding supplement (BOOST / RESOURCE BREEZE) LIQD Take 1 Container by mouth 3 (three) times daily with meals. 237 mL 12  . gabapentin (NEURONTIN) 300 MG capsule Take 1 capsule (300 mg total) by mouth at bedtime. 90 capsule 1  . guaiFENesin-dextromethorphan (ROBITUSSIN DM) 100-10 MG/5ML syrup Take 5 mLs by mouth every 4 (four) hours as needed for cough. 118 mL 0  . insulin aspart (NOVOLOG) 100 UNIT/ML injection Inject 0-9 Units into the skin 3 (three) times daily with meals. (Patient not taking: Reported on 12/02/2016) 10 mL 11  . LASIX 40 MG tablet Take 1 tablet (40 mg total) by mouth 2 (two) times daily. 60 tablet 0  . metoCLOPramide (REGLAN) 10 MG tablet Take 0.5 tablets (5 mg total) by mouth 3 (three) times daily before meals. 90 tablet 0  .  metoprolol succinate (TOPROL-XL) 25 MG 24 hr tablet Take 1 tablet (25 mg total) by mouth daily. 90 tablet 1  . ondansetron (ZOFRAN) 4 MG tablet Take 1 tablet (4 mg total) by mouth daily as needed for nausea or vomiting. 30 tablet 1  . pantoprazole (PROTONIX) 40 MG tablet Take 1 tablet (40 mg total) by mouth daily at 6 (six) AM. 30 tablet 0  . polyethylene glycol (MIRALAX / GLYCOLAX) packet Take 17 g by mouth daily. 14 each 0  . potassium chloride SA (K-DUR,KLOR-CON) 20 MEQ tablet Take 1 tablet (20 mEq total) by mouth daily. 10 tablet 0  . ranitidine (ZANTAC) 75 MG tablet Take 75 mg by mouth once.    . senna-docusate (SENOKOT-S) 8.6-50 MG tablet Take 2 tablets by mouth 2 (two) times daily. 30 tablet 0  . traMADol (ULTRAM) 50 MG tablet Take 1 tablet (50 mg total) by mouth every 6 (six) hours as needed for moderate pain. 20 tablet 0   No current facility-administered medications for this visit.  Facility-Administered Medications Ordered in Other Visits  Medication Dose Route Frequency Provider Last Rate Last Dose  . sodium chloride flush (NS) 0.9 % injection 10 mL  10 mL Intravenous PRN Alvy Bimler, Nohelia Valenza, MD   10 mL at 12/13/16 0908    PHYSICAL EXAMINATION: ECOG PERFORMANCE STATUS: 3 - Symptomatic, >50% confined to bed  Vitals:   12/13/16 0926  BP: (!) 80/63  Pulse: (!) 105  Resp: 16   Filed Weights    GENERAL:alert, no distress and comfortable.  The patient looks debilitated, older than stated age SKIN: Noted significant skin bruises but no petechiae EYES: normal, Conjunctiva are pink and non-injected, sclera clear OROPHARYNX:no exudate, no erythema and lips, buccal mucosa, and tongue normal  NECK: supple, thyroid normal size, non-tender, without nodularity LYMPH:  no palpable lymphadenopathy in the cervical, axillary or inguinal LUNGS: clear to auscultation and percussion with normal breathing effort HEART: regular rate & rhythm and no murmurs and no lower extremity  edema ABDOMEN:abdomen soft, non-tender, distended and normal bowel sounds Musculoskeletal:no cyanosis of digits and no clubbing  NEURO: alert & oriented x 3 with fluent speech, no focal motor/sensory deficits  LABORATORY DATA:  I have reviewed the data as listed    Component Value Date/Time   NA 133 (L) 12/13/2016 0900   K 4.2 12/13/2016 0900   CL 92 (L) 12/07/2016 0100   CO2 22 12/13/2016 0900   GLUCOSE 190 (H) 12/13/2016 0900   BUN 16.7 12/13/2016 0900   CREATININE 0.9 12/13/2016 0900   CALCIUM 8.5 12/13/2016 0900   PROT 6.0 (L) 12/13/2016 0900   ALBUMIN 2.8 (L) 12/13/2016 0900   AST 70 (H) 12/13/2016 0900   ALT 53 12/13/2016 0900   ALKPHOS 140 12/13/2016 0900   BILITOT 0.80 12/13/2016 0900   GFRNONAA >60 12/07/2016 0100   GFRAA >60 12/07/2016 0100    No results found for: SPEP, UPEP  Lab Results  Component Value Date   WBC 2.6 (L) 12/13/2016   NEUTROABS 2.3 12/13/2016   HGB 8.7 (L) 12/13/2016   HCT 26.4 (L) 12/13/2016   MCV 90.7 12/13/2016   PLT 225 12/13/2016      Chemistry      Component Value Date/Time   NA 133 (L) 12/13/2016 0900   K 4.2 12/13/2016 0900   CL 92 (L) 12/07/2016 0100   CO2 22 12/13/2016 0900   BUN 16.7 12/13/2016 0900   CREATININE 0.9 12/13/2016 0900      Component Value Date/Time   CALCIUM 8.5 12/13/2016 0900   ALKPHOS 140 12/13/2016 0900   AST 70 (H) 12/13/2016 0900   ALT 53 12/13/2016 0900   BILITOT 0.80 12/13/2016 0900       RADIOGRAPHIC STUDIES: I have personally reviewed the radiological images as listed and agreed with the findings in the report. Dg Abd 1 View  Result Date: 11/18/2016 CLINICAL DATA:  Followup ileus or partial small bowel obstruction. Ovarian cancer. EXAM: ABDOMEN - 1 VIEW COMPARISON:  11/15/2016. FINDINGS: Normal bowel gas pattern.  Unremarkable bones. IMPRESSION: No acute abnormality. Electronically Signed   By: Claudie Revering M.D.   On: 11/18/2016 15:02   Dg Abd 1 View  Result Date: 11/15/2016 CLINICAL  DATA:  Abdominal distention, back pain, history of SP of EXAM: ABDOMEN - 1 VIEW COMPARISON:  Abdomen film of 11/14/2016 and CT abdomen pelvis of 11/10/2016 FINDINGS: Compared to the most recent KUB, there is less gaseous distention of large and small bowel. There is some gaseous distention of the stomach. No small  bowel obstruction is evident. A moderate amount of feces is noted in the rectosigmoid colon. No opaque calculi are seen. The bones are unremarkable. IMPRESSION: 1. No present evidence of small-bowel obstruction. Very little gaseous distention of large or small bowel. 2. No opaque calculi . Electronically Signed   By: Ivar Drape M.D.   On: 11/15/2016 08:45   Ct Abdomen Pelvis W Contrast  Result Date: 12/02/2016 CLINICAL DATA:  71 y/o F; r/o sbo, Hx of ovarian CA, now w n/v, abd discomfort. EXAM: CT ABDOMEN AND PELVIS WITH CONTRAST TECHNIQUE: Multidetector CT imaging of the abdomen and pelvis was performed using the standard protocol following bolus administration of intravenous contrast. CONTRAST:  153mL ISOVUE-300 IOPAMIDOL (ISOVUE-300) INJECTION 61% COMPARISON:  11/12/2014 CT abdomen and pelvis. FINDINGS: Lower chest: Coronary artery calcifications. Small bilateral pleural effusions. Hepatobiliary: Hepatic steatosis. Cholelithiasis. No intra or extrahepatic biliary ductal dilatation. Pancreas: Unremarkable. No pancreatic ductal dilatation or surrounding inflammatory changes. Spleen: Normal in size without focal abnormality. Adrenals/Urinary Tract: Adrenal glands are unremarkable. Kidneys are normal, without renal calculi, focal lesion, or hydronephrosis. Bladder is unremarkable. Stomach/Bowel: No obstructive or inflammatory changes of bowel. Large volume of stool in rectum, question constipation. Vascular/Lymphatic: Aortic atherosclerosis. No enlarged abdominal or pelvic lymph nodes. Reproductive: Stable 4 cm left ovarian cyst. Stable right adnexal mass measuring 59 x 51 x 36 mm (AP x ML x CC series  2, image 71 and series 3, image 62). Other: Moderate volume of peritoneal ascites is increased from prior CT of abdomen and pelvis. Nodular thickening within the omentum is decreased from the prior CT. There is persistent peritoneal carcinomatosis within omentum and pericolic gutters with small foci of enhancement. Increased anasarca within the abdominal wall. Musculoskeletal: No acute or significant osseous findings. IMPRESSION: 1. Moderate volume of ascites increased from prior CT and increased anasarca. 2. Omental and peritoneal carcinomatosis is mildly decreased. 3. Stable right adnexal mass measuring up to 5.9 cm. 4. Hepatic steatosis.  Cholelithiasis. 5. Small bilateral pleural effusions. 6. Large volume of stool in rectum, question constipation. Electronically Signed   By: Kristine Garbe M.D.   On: 12/02/2016 19:35   US Abdomen Limited  Result Date: 12/05/2016 CLINICAL DATA:  Evaluate for abdominal ascites and perform ultrasound-guided paracentesis as indicated. EXAM: LIMITED ABDOMEN ULTRASOUND FOR ASCITES TECHNIQUE: Limited ultrasound survey for ascites was performed in all four abdominal quadrants. COMPARISON:  Ascites search ultrasound - 12/03/2016; CT abdomen pelvis - 12/02/2016 FINDINGS: Four quadrant grayscale images are provided of the abdomen and demonstrates only a trace to small amount of peritoneal fluid with largest collection with the left upper abdominal quadrant, similar to abdominal CT performed 12/02/2016 and too small to allow for safe ultrasound-guided paracentesis. IMPRESSION: Trace to small amount of intra-abdominal ascites, too small to allow for safe ultrasound-guided paracentesis. Electronically Signed   By: Sandi Mariscal M.D.   On: 12/05/2016 10:59   US Abdomen Limited  Result Date: 12/03/2016 CLINICAL DATA:  Ascites. EXAM: LIMITED ABDOMEN ULTRASOUND FOR ASCITES TECHNIQUE: Limited ultrasound survey for ascites was performed in all four abdominal quadrants. COMPARISON:   Abdomen and pelvis CT dated 12/02/2016. FINDINGS: Small moderate amount of free peritoneal fluid. IMPRESSION: Small to moderate amount of ascites. Electronically Signed   By: Claudie Revering M.D.   On: 12/03/2016 10:13   US Abdomen Limited  Result Date: 11/20/2016 CLINICAL DATA:  Gyn malignancy with carcinomatosis. Abdominal distention and malignant small-bowel obstruction. Request is made for evaluations verbal paracentesis. EXAM: LIMITED ABDOMEN ULTRASOUND FOR ASCITES TECHNIQUE: Limited  ultrasound survey for ascites was performed in all four abdominal quadrants. COMPARISON:  None. FINDINGS: All 4 quadrants evaluated and minimal ascites is noted. IMPRESSION: Not enough fluid for paracentesis. Read by: Saverio Danker, PA-C Electronically Signed   By: Markus Daft M.D.   On: 11/20/2016 13:05   Ir US Guide Vasc Access Left  Result Date: 11/25/2016 INDICATION: 71 year old with peritoneal carcinomatosis. Port-A-Cath needed for treatment. The patient's white blood cell count is markedly low and abnormal. This was discussed with the patient's oncologist, Dr. Alvy Bimler, who felt the patient was safe for Port-A-Cath placement. EXAM: FLUOROSCOPIC AND ULTRASOUND GUIDED PLACEMENT OF A SUBCUTANEOUS PORT. Physician: Stephan Minister. Anselm Pancoast, MD MEDICATIONS: Cleocin 600 mg administered intravenously within one hour of incision. ANESTHESIA/SEDATION: Versed 1.0 mg IV; Fentanyl 25 mcg IV; Moderate Sedation Time:  40 minutes The patient was continuously monitored during the procedure by the interventional radiology nurse under my direct supervision. FLUOROSCOPY TIME:  54 seconds, 7 mGy COMPLICATIONS: None immediate. PROCEDURE: The risks of the procedure were explained to the patient. Informed consent was obtained. Patient was placed supine on the interventional table. Ultrasound confirmed a patent left internal jugular vein. The left chest and neck were cleaned with a skin antiseptic and a sterile drape was placed. Maximal barrier sterile  technique was utilized including caps, mask, sterile gowns, sterile gloves, sterile drape, hand hygiene and skin antiseptic. The left neck was anesthetized with 1% lidocaine. Small incision was made in the left neck with a blade. Micropuncture set was placed in the left IJ with ultrasound guidance. The left chest was anesthetized with 1% lidocaine with epinephrine. #15 blade was used to make an incision and a subcutaneous port pocket was formed. [place] was selected. Subcutaneous tunnel was formed with a stiff tunneling device. The port catheter was brought through the subcutaneous tunnel. The micropuncture set was exchanged for a peel-away sheath. The catheter was placed through the peel-away sheath and the tip was positioned at the superior cavoatrial junction. Catheter placement was confirmed with fluoroscopy. The port was assembled and placed within the subcutaneous pocket. The port was accessed and flushed with heparinized saline. The port pocket was closed using two layers of absorbable sutures and Dermabond. The vein skin site was closed using a single layer of absorbable suture and Dermabond. Sterile dressings were applied. Patient tolerated the procedure well without an immediate complication. Ultrasound and fluoroscopic images were taken and saved for this procedure. IMPRESSION: Placement of a subcutaneous port device. Electronically Signed   By: Markus Daft M.D.   On: 11/25/2016 15:53   Dg Abd Portable 1v-small Bowel Obstruction Protocol-initial, 8 Hr Delay  Result Date: 11/20/2016 CLINICAL DATA:  71 year old female 8 hours status post enteric contrast via NG tube. EXAM: PORTABLE ABDOMEN - 1 VIEW COMPARISON:  1130 hours today, and earlier. FINDINGS: Portable AP supine view at 2123 hours. Stable enteric tube, side hole up the level of the gastric fundus. Oral contrast present throughout the right colon to the mid transverse colon. No dilated small or large bowel loops. Some retained stool in  the colon. No acute osseous abnormality identified. IMPRESSION: 1. Enteric contrast has reached the mid transverse colon. No dilated small or large bowel loops. 2. Stable enteric tube. Electronically Signed   By: Genevie Ann M.D.   On: 11/20/2016 21:47   Dg Abd Portable 1v-small Bowel Protocol-position Verification  Result Date: 11/20/2016 CLINICAL DATA:  NG tube placement EXAM: PORTABLE ABDOMEN - 1 VIEW COMPARISON:  11/18/2016 FINDINGS: NG tube  is in the stomach. Nonobstructive bowel gas pattern. No free air organomegaly. IMPRESSION: NG tube tip in the stomach. Electronically Signed   By: Rolm Baptise M.D.   On: 11/20/2016 11:47   Dg Abd Portable 1v  Result Date: 11/14/2016 CLINICAL DATA:  Small bowel obstruction, intra-abdominal ascites an malignancy. EXAM: PORTABLE ABDOMEN - 1 VIEW COMPARISON:  Abdominal and pelvic CT scan of November 10, 2016 FINDINGS: There are loops of mildly distended gas-filled small bowel present. There is stool and gas within normal caliber colon and rectum. No free extraluminal gas collections are observed. IMPRESSION: The bowel gas pattern is consistent with small bowel ileus or partial mid to distal small bowel obstruction. Electronically Signed   By: David  Martinique M.D.   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 Port-A-Cath needed for treatment. The patient's white blood cell count is markedly low and abnormal. This was discussed with the patient's oncologist, Dr. Alvy Bimler, who felt the patient was safe for Port-A-Cath placement. EXAM: FLUOROSCOPIC AND ULTRASOUND GUIDED PLACEMENT OF A SUBCUTANEOUS PORT. Physician: Stephan Minister. Anselm Pancoast, MD MEDICATIONS: Cleocin 600 mg administered intravenously within one hour of incision. ANESTHESIA/SEDATION: Versed 1.0 mg IV; Fentanyl 25 mcg IV; Moderate Sedation Time:  40 minutes The patient was continuously monitored during the procedure by the interventional  radiology nurse under my direct supervision. FLUOROSCOPY TIME:  54 seconds, 7 mGy COMPLICATIONS: None immediate. PROCEDURE: The risks of the procedure were explained to the patient. Informed consent was obtained. Patient was placed supine on the interventional table. Ultrasound confirmed a patent left internal jugular vein. The left chest and neck were cleaned with a skin antiseptic and a sterile drape was placed. Maximal barrier sterile technique was utilized including caps, mask, sterile gowns, sterile gloves, sterile drape, hand hygiene and skin antiseptic. The left neck was anesthetized with 1% lidocaine. Small incision was made in the left neck with a blade. Micropuncture set was placed in the left IJ with ultrasound guidance. The left chest was anesthetized with 1% lidocaine with epinephrine. #15 blade was used to make an incision and a subcutaneous port pocket was formed. [place] was selected. Subcutaneous tunnel was formed with a stiff tunneling device. The port catheter was brought through the subcutaneous tunnel. The micropuncture set was exchanged for a peel-away sheath. The catheter was placed through the peel-away sheath and the tip was positioned at the superior cavoatrial junction. Catheter placement was confirmed with fluoroscopy. The port was assembled and placed within the subcutaneous pocket. The port was accessed and flushed with heparinized saline. The port pocket was closed using two layers of absorbable sutures and Dermabond. The vein skin site was closed using a single layer of absorbable suture and Dermabond. Sterile dressings were applied. Patient tolerated the procedure well without an immediate complication. Ultrasound and fluoroscopic images were taken and saved for this procedure. IMPRESSION: Placement of a subcutaneous port device. Electronically Signed   By: Markus Daft M.D.   On: 11/25/2016 15:53    ASSESSMENT & PLAN:  Ovarian cancer on right [facility]): Stage 3C The  patient is miserable with failure to thrive and poor performance status despite recent treatment She is not motivated to provide adequate history and continues to make unpleasant remarks on her care received so far With her poor baseline performance status, I do not feel she would be a candidate to receive further chemotherapy I have written a detailed note to the  skilled nursing facility to consider palliative care consult and hospice I will inform her GYN surgeon that I will no longer be providing neoadjuvant chemotherapy due to her poor performance status  Acute thromboembolism of deep veins of right lower extremity ([place]) The patient has significant bruising She claims she has been having chronic bleeding in her urine or stool The patient has been incontinent When I try to verify whether she is indeed bleeding she stated that she has not been looking but she felt that she is bleeding I have written instruction for the skilled nursing facility to pay attention to see if she has signs of bleeding  Severe protein-calorie malnutrition ([place]) She has poor oral intake She claimed that she has uncontrolled nausea and vomiting However, when I asked her further, she stated that she has not been consistent with nausea or vomiting I recommend schedule Reglan before meals She has Zofran to take as needed   Failure to thrive in adult She has significant debility due to prior history of stroke and recent diagnosis of cancer When I asked whether she has been getting physical therapy and rehab she laughed at the comment The nursing aide has asked whether she has received at least 1-2 hours of rehab daily and the patient may remarks such as, "don't be silly". "I don't remember". From what I could gather, she has been most of her time in bed She is incredibly upset today because she was "dragged" out or her bed early to attend appointment She would not mind coming back in the future if she can come when  she feels good Unfortunately, I do not feel strongly that she would benefit from future chemotherapy I have recommended further aggressive supportive care while she is at the skilled nursing facility such as better control of her nausea and if she does not improve, she may need to be readmitted for symptom management  DNR (do not resuscitate) The patient has been seen by palliative care consult in the hospital Her code status is DNR Given her decline in performance status, I feel it is appropriate to consider hospice care I will update the GYN oncology service that I will no longer be prescribing chemotherapy and recommend skilled nursing facility to get palliative care involved moving forward   Orders Placed This Encounter  Procedures  . SCHEDULING COMMUNICATION INJECTION    Schedule 15 minute injection appointment   All questions were answered. The patient knows to call the clinic with any problems, questions or concerns. No barriers to learning was detected. I spent 40 minutes counseling the patient face to face. The total time spent in the appointment was 60 minutes and more than 50% was on counseling and review of test results     Heath Lark, MD 12/13/2016 10:15 AM

## 2016-12-13 NOTE — Assessment & Plan Note (Signed)
The patient has been seen by palliative care consult in the hospital Her code status is DNR Given her decline in performance status, I feel it is appropriate to consider hospice care I will update the GYN oncology service that I will no longer be prescribing chemotherapy and recommend skilled nursing facility to get palliative care involved moving forward

## 2016-12-13 NOTE — Patient Instructions (Signed)
Implanted Port Home Guide An implanted port is a type of central line that is placed under the skin. Central lines are used to provide IV access when treatment or nutrition needs to be given through a person's veins. Implanted ports are used for long-term IV access. An implanted port may be placed because:  You need IV medicine that would be irritating to the small veins in your hands or arms.  You need long-term IV medicines, such as antibiotics.  You need IV nutrition for a long period.  You need frequent blood draws for lab tests.  You need dialysis.  Implanted ports are usually placed in the chest area, but they can also be placed in the upper arm, the abdomen, or the leg. An implanted port has two main parts:  Reservoir. The reservoir is round and will appear as a small, raised area under your skin. The reservoir is the part where a needle is inserted to give medicines or draw blood.  Catheter. The catheter is a thin, flexible tube that extends from the reservoir. The catheter is placed into a large vein. Medicine that is inserted into the reservoir goes into the catheter and then into the vein.  How will I care for my incision site? Do not get the incision site wet. Bathe or shower as directed by your health care provider. How is my port accessed? Special steps must be taken to access the port:  Before the port is accessed, a numbing cream can be placed on the skin. This helps numb the skin over the port site.  Your health care provider uses a sterile technique to access the port. ? Your health care provider must put on a mask and sterile gloves. ? The skin over your port is cleaned carefully with an antiseptic and allowed to dry. ? The port is gently pinched between sterile gloves, and a needle is inserted into the port.  Only "non-coring" port needles should be used to access the port. Once the port is accessed, a blood return should be checked. This helps ensure that the port  is in the vein and is not clogged.  If your port needs to remain accessed for a constant infusion, a clear (transparent) bandage will be placed over the needle site. The bandage and needle will need to be changed every week, or as directed by your health care provider.  Keep the bandage covering the needle clean and dry. Do not get it wet. Follow your health care provider's instructions on how to take a shower or bath while the port is accessed.  If your port does not need to stay accessed, no bandage is needed over the port.  What is flushing? Flushing helps keep the port from getting clogged. Follow your health care provider's instructions on how and when to flush the port. Ports are usually flushed with saline solution or a medicine called heparin. The need for flushing will depend on how the port is used.  If the port is used for intermittent medicines or blood draws, the port will need to be flushed: ? After medicines have been given. ? After blood has been drawn. ? As part of routine maintenance.  If a constant infusion is running, the port may not need to be flushed.  How long will my port stay implanted? The port can stay in for as long as your health care provider thinks it is needed. When it is time for the port to come out, surgery will be   done to remove it. The procedure is similar to the one performed when the port was put in. When should I seek immediate medical care? When you have an implanted port, you should seek immediate medical care if:  You notice a bad smell coming from the incision site.  You have swelling, redness, or drainage at the incision site.  You have more swelling or pain at the port site or the surrounding area.  You have a fever that is not controlled with medicine.  This information is not intended to replace advice given to you by your health care provider. Make sure you discuss any questions you have with your health care provider. Document  Released: 05/09/2005 Document Revised: 10/15/2015 Document Reviewed: 01/14/2013 Elsevier Interactive Patient Education  2017 [employer]  

## 2016-12-13 NOTE — Assessment & Plan Note (Signed)
The patient has significant bruising She claims she has been having chronic bleeding in her urine or stool The patient has been incontinent When I try to verify whether she is indeed bleeding she stated that she has not been looking but she felt that she is bleeding I have written instruction for the skilled nursing facility to pay attention to see if she has signs of bleeding

## 2016-12-13 NOTE — Assessment & Plan Note (Signed)
She has significant debility due to prior history of stroke and recent diagnosis of cancer When I asked whether she has been getting physical therapy and rehab she laughed at the comment The nursing aide has asked whether she has received at least 1-2 hours of rehab daily and the patient may remarks such as, "don't be silly". "I don't remember". From what I could gather, she has been most of her time in bed She is incredibly upset today because she was "dragged" out or her bed early to attend appointment She would not mind coming back in the future if she can come when she feels good Unfortunately, I do not feel strongly that she would benefit from future chemotherapy I have recommended further aggressive supportive care while she is at the skilled nursing facility such as better control of her nausea and if she does not improve, she may need to be readmitted for symptom management

## 2016-12-13 NOTE — Assessment & Plan Note (Signed)
She has poor oral intake She claimed that she has uncontrolled nausea and vomiting However, when I asked her further, she stated that she has not been consistent with nausea or vomiting I recommend schedule Reglan before meals She has Zofran to take as needed

## 2016-12-13 NOTE — Assessment & Plan Note (Signed)
The patient is miserable with failure to thrive and poor performance status despite recent treatment She is not motivated to provide adequate history and continues to make unpleasant remarks on her care received so far With her poor baseline performance status, I do not feel she would be a candidate to receive further chemotherapy I have written a detailed note to the skilled nursing facility to consider palliative care consult and hospice I will inform her GYN surgeon that I will no longer be providing neoadjuvant chemotherapy due to her poor performance status

## 2016-12-14 LAB — CA 125: Cancer Antigen (CA) 125: 393.3 U/mL — ABNORMAL HIGH (ref 0.0–38.1)

## 2016-12-19 ENCOUNTER — Inpatient Hospital Stay (HOSPITAL_COMMUNITY): Payer: Medicare PPO

## 2016-12-19 ENCOUNTER — Encounter (HOSPITAL_COMMUNITY): Payer: Self-pay | Admitting: *Deleted

## 2016-12-19 ENCOUNTER — Inpatient Hospital Stay (HOSPITAL_COMMUNITY)
Admission: EM | Admit: 2016-12-19 | Discharge: 2016-12-22 | DRG: 064 | Disposition: A | Discharge status: Hospice | Payer: Medicare PPO | Attending: Internal Medicine | Admitting: Internal Medicine

## 2016-12-19 ENCOUNTER — Emergency Department (HOSPITAL_COMMUNITY): Payer: Medicare PPO

## 2016-12-19 ENCOUNTER — Other Ambulatory Visit: Payer: Self-pay

## 2016-12-19 DIAGNOSIS — Z794 Long term (current) use of insulin: Secondary | ICD-10-CM

## 2016-12-19 DIAGNOSIS — R402142 Coma scale, eyes open, spontaneous, at arrival to emergency department: Secondary | ICD-10-CM | POA: Diagnosis present

## 2016-12-19 DIAGNOSIS — R42 Dizziness and giddiness: Secondary | ICD-10-CM

## 2016-12-19 DIAGNOSIS — E43 Unspecified severe protein-calorie malnutrition: Secondary | ICD-10-CM | POA: Diagnosis present

## 2016-12-19 DIAGNOSIS — K219 Gastro-esophageal reflux disease without esophagitis: Secondary | ICD-10-CM | POA: Diagnosis present

## 2016-12-19 DIAGNOSIS — Z86711 Personal history of pulmonary embolism: Secondary | ICD-10-CM

## 2016-12-19 DIAGNOSIS — R402362 Coma scale, best motor response, obeys commands, at arrival to emergency department: Secondary | ICD-10-CM | POA: Diagnosis present

## 2016-12-19 DIAGNOSIS — Z6828 Body mass index (BMI) 28.0-28.9, adult: Secondary | ICD-10-CM | POA: Diagnosis not present

## 2016-12-19 DIAGNOSIS — Z66 Do not resuscitate: Secondary | ICD-10-CM | POA: Diagnosis present

## 2016-12-19 DIAGNOSIS — R627 Adult failure to thrive: Secondary | ICD-10-CM | POA: Diagnosis present

## 2016-12-19 DIAGNOSIS — I959 Hypotension, unspecified: Secondary | ICD-10-CM | POA: Diagnosis present

## 2016-12-19 DIAGNOSIS — Z7901 Long term (current) use of anticoagulants: Secondary | ICD-10-CM | POA: Diagnosis not present

## 2016-12-19 DIAGNOSIS — E119 Type 2 diabetes mellitus without complications: Secondary | ICD-10-CM | POA: Diagnosis present

## 2016-12-19 DIAGNOSIS — R253 Fasciculation: Secondary | ICD-10-CM | POA: Diagnosis not present

## 2016-12-19 DIAGNOSIS — R569 Unspecified convulsions: Secondary | ICD-10-CM | POA: Diagnosis not present

## 2016-12-19 DIAGNOSIS — C561 Malignant neoplasm of right ovary: Secondary | ICD-10-CM | POA: Diagnosis present

## 2016-12-19 DIAGNOSIS — Z86718 Personal history of other venous thrombosis and embolism: Secondary | ICD-10-CM | POA: Diagnosis not present

## 2016-12-19 DIAGNOSIS — I82411 Acute embolism and thrombosis of right femoral vein: Secondary | ICD-10-CM | POA: Diagnosis not present

## 2016-12-19 DIAGNOSIS — Z515 Encounter for palliative care: Secondary | ICD-10-CM | POA: Diagnosis present

## 2016-12-19 DIAGNOSIS — E876 Hypokalemia: Secondary | ICD-10-CM | POA: Diagnosis not present

## 2016-12-19 DIAGNOSIS — I82409 Acute embolism and thrombosis of unspecified deep veins of unspecified lower extremity: Secondary | ICD-10-CM | POA: Diagnosis present

## 2016-12-19 DIAGNOSIS — E118 Type 2 diabetes mellitus with unspecified complications: Secondary | ICD-10-CM | POA: Diagnosis not present

## 2016-12-19 DIAGNOSIS — Z79899 Other long term (current) drug therapy: Secondary | ICD-10-CM | POA: Diagnosis not present

## 2016-12-19 DIAGNOSIS — I639 Cerebral infarction, unspecified: Principal | ICD-10-CM | POA: Diagnosis present

## 2016-12-19 DIAGNOSIS — Z880 Allergy status to penicillin: Secondary | ICD-10-CM

## 2016-12-19 DIAGNOSIS — C799 Secondary malignant neoplasm of unspecified site: Secondary | ICD-10-CM | POA: Diagnosis not present

## 2016-12-19 DIAGNOSIS — A419 Sepsis, unspecified organism: Secondary | ICD-10-CM

## 2016-12-19 DIAGNOSIS — C800 Disseminated malignant neoplasm, unspecified: Secondary | ICD-10-CM | POA: Diagnosis present

## 2016-12-19 DIAGNOSIS — Z7189 Other specified counseling: Secondary | ICD-10-CM

## 2016-12-19 DIAGNOSIS — G9340 Encephalopathy, unspecified: Secondary | ICD-10-CM

## 2016-12-19 DIAGNOSIS — R29700 NIHSS score 0: Secondary | ICD-10-CM | POA: Diagnosis present

## 2016-12-19 DIAGNOSIS — I69351 Hemiplegia and hemiparesis following cerebral infarction affecting right dominant side: Secondary | ICD-10-CM | POA: Diagnosis not present

## 2016-12-19 DIAGNOSIS — I10 Essential (primary) hypertension: Secondary | ICD-10-CM | POA: Diagnosis present

## 2016-12-19 DIAGNOSIS — R6510 Systemic inflammatory response syndrome (SIRS) of non-infectious origin without acute organ dysfunction: Secondary | ICD-10-CM | POA: Diagnosis present

## 2016-12-19 DIAGNOSIS — Z5320 Procedure and treatment not carried out because of patient's decision for unspecified reasons: Secondary | ICD-10-CM

## 2016-12-19 DIAGNOSIS — L8990 Pressure ulcer of unspecified site, unspecified stage: Secondary | ICD-10-CM | POA: Insufficient documentation

## 2016-12-19 DIAGNOSIS — G4089 Other seizures: Secondary | ICD-10-CM | POA: Diagnosis present

## 2016-12-19 DIAGNOSIS — Z5329 Procedure and treatment not carried out because of patient's decision for other reasons: Secondary | ICD-10-CM

## 2016-12-19 DIAGNOSIS — D649 Anemia, unspecified: Secondary | ICD-10-CM | POA: Diagnosis present

## 2016-12-19 DIAGNOSIS — R402252 Coma scale, best verbal response, oriented, at arrival to emergency department: Secondary | ICD-10-CM | POA: Diagnosis present

## 2016-12-19 LAB — COMPREHENSIVE METABOLIC PANEL
ALBUMIN: 2.9 g/dL — AB (ref 3.5–5.0)
ALT: 28 U/L (ref 14–54)
AST: 30 U/L (ref 15–41)
Alkaline Phosphatase: 119 U/L (ref 38–126)
Anion gap: 8 (ref 5–15)
BUN: 8 mg/dL (ref 6–20)
CHLORIDE: 97 mmol/L — AB (ref 101–111)
CO2: 29 mmol/L (ref 22–32)
Calcium: 8 mg/dL — ABNORMAL LOW (ref 8.9–10.3)
Creatinine, Ser: 0.89 mg/dL (ref 0.44–1.00)
GFR calc Af Amer: >60 mL/min (ref >60)
GFR calc non Af Amer: >60 mL/min (ref >60)
GLUCOSE: 97 mg/dL (ref 65–99)
POTASSIUM: 2.8 mmol/L — AB (ref 3.5–5.1)
Sodium: 134 mmol/L — ABNORMAL LOW (ref 135–145)
Total Bilirubin: 0.6 mg/dL (ref 0.3–1.2)
Total Protein: 6 g/dL — ABNORMAL LOW (ref 6.5–8.1)

## 2016-12-19 LAB — CBC WITH DIFFERENTIAL/PLATELET
BASOS PCT: 0 %
Basophils Absolute: 0 10*3/uL (ref 0.0–0.1)
EOS PCT: 0 %
Eosinophils Absolute: 0 10*3/uL (ref 0.0–0.7)
HEMATOCRIT: 26.2 % — AB (ref 36.0–46.0)
Hemoglobin: 8.4 g/dL — ABNORMAL LOW (ref 12.0–15.0)
Lymphocytes Relative: 9 %
Lymphs Abs: 1.4 10*3/uL (ref 0.7–4.0)
MCH: 29.8 pg (ref 26.0–34.0)
MCHC: 32.1 g/dL (ref 30.0–36.0)
MCV: 92.9 fL (ref 78.0–100.0)
MONO ABS: 1.4 10*3/uL — AB (ref 0.1–1.0)
Monocytes Relative: 9 %
NEUTROS PCT: 82 %
Neutro Abs: 12.2 10*3/uL — ABNORMAL HIGH (ref 1.7–7.7)
PLATELETS: 152 10*3/uL (ref 150–400)
RBC: 2.82 MIL/uL — ABNORMAL LOW (ref 3.87–5.11)
RDW: 18.9 % — ABNORMAL HIGH (ref 11.5–15.5)
WBC: 15 10*3/uL — ABNORMAL HIGH (ref 4.0–10.5)

## 2016-12-19 LAB — GLUCOSE, CAPILLARY
GLUCOSE-CAPILLARY: 73 mg/dL (ref 65–99)
GLUCOSE-CAPILLARY: 86 mg/dL (ref 65–99)

## 2016-12-19 LAB — TYPE AND SCREEN
ABO/RH(D): A NEG
Antibody Screen: NEGATIVE

## 2016-12-19 LAB — AMMONIA: Ammonia: 20 umol/L (ref 9–35)

## 2016-12-19 LAB — MRSA PCR SCREENING: MRSA by PCR: NEGATIVE

## 2016-12-19 LAB — APTT: aPTT: 37 seconds — ABNORMAL HIGH (ref 24–36)

## 2016-12-19 LAB — LACTIC ACID, PLASMA: Lactic Acid, Venous: 0.9 mmol/L (ref 0.5–1.9)

## 2016-12-19 LAB — PROTIME-INR
INR: 1.13
Prothrombin Time: 14.6 seconds (ref 11.4–15.2)

## 2016-12-19 MED ORDER — BISACODYL 10 MG RE SUPP: 10.0000 mg | Freq: Every day | RECTAL | Status: DC

## 2016-12-19 MED ORDER — LORAZEPAM 2 MG/ML IJ SOLN
0.5000 mg | Freq: Four times a day (QID) | INTRAMUSCULAR | Status: DC | PRN
  Filled 2016-12-19: qty 1

## 2016-12-19 MED ORDER — SODIUM CHLORIDE 0.9% FLUSH
3.0000 mL | Freq: Two times a day (BID) | INTRAVENOUS | Status: DC
  Administered 2016-12-19 – 2016-12-21 (×4): 3 mL via INTRAVENOUS

## 2016-12-19 MED ORDER — GADOBENATE DIMEGLUMINE 529 MG/ML IV SOLN: 15.0000 mL | Freq: Once | INTRAVENOUS | Status: DC | PRN

## 2016-12-19 MED ORDER — ONDANSETRON HCL 4 MG/2ML IJ SOLN: 4.0000 mg | Freq: Four times a day (QID) | INTRAMUSCULAR | Status: DC | PRN

## 2016-12-19 MED ORDER — PIPERACILLIN-TAZOBACTAM 3.375 G IVPB
3.3750 g | Freq: Three times a day (TID) | INTRAVENOUS | Status: DC
  Administered 2016-12-20: 3.375 g via INTRAVENOUS
  Filled 2016-12-19: qty 50

## 2016-12-19 MED ORDER — ACETAMINOPHEN 325 MG PO TABS: 650.0000 mg | ORAL_TABLET | Freq: Four times a day (QID) | ORAL | Status: DC | PRN

## 2016-12-19 MED ORDER — SODIUM CHLORIDE 0.9 % IV BOLUS (SEPSIS)
500.0000 mL | Freq: Once | INTRAVENOUS | Status: AC
  Administered 2016-12-19: 500 mL via INTRAVENOUS

## 2016-12-19 MED ORDER — ACETAMINOPHEN 650 MG RE SUPP: 650.0000 mg | Freq: Four times a day (QID) | RECTAL | Status: DC | PRN

## 2016-12-19 MED ORDER — SENNOSIDES-DOCUSATE SODIUM 8.6-50 MG PO TABS
2.0000 | ORAL_TABLET | Freq: Two times a day (BID) | ORAL | Status: DC
  Administered 2016-12-20 – 2016-12-21 (×3): 2 via ORAL
  Filled 2016-12-19 (×3): qty 2

## 2016-12-19 MED ORDER — MIRTAZAPINE 15 MG PO TABS
15.0000 mg | ORAL_TABLET | Freq: Every day | ORAL | Status: DC
  Administered 2016-12-19 – 2016-12-21 (×3): 15 mg via ORAL
  Filled 2016-12-19 (×2): qty 0.5
  Filled 2016-12-19 (×3): qty 1

## 2016-12-19 MED ORDER — METOCLOPRAMIDE HCL 5 MG PO TABS
5.0000 mg | ORAL_TABLET | Freq: Three times a day (TID) | ORAL | Status: DC
  Administered 2016-12-20 – 2016-12-22 (×3): 5 mg via ORAL
  Filled 2016-12-19 (×3): qty 1

## 2016-12-19 MED ORDER — BOOST / RESOURCE BREEZE PO LIQD: 237.0000 mL | Freq: Three times a day (TID) | ORAL | Status: DC

## 2016-12-19 MED ORDER — POTASSIUM CHLORIDE 10 MEQ/100ML IV SOLN
10.0000 meq | INTRAVENOUS | Status: AC
  Administered 2016-12-19 (×3): 10 meq via INTRAVENOUS
  Filled 2016-12-19 (×2): qty 100

## 2016-12-19 MED ORDER — TRAMADOL HCL 50 MG PO TABS
50.0000 mg | ORAL_TABLET | Freq: Four times a day (QID) | ORAL | Status: DC | PRN
  Administered 2016-12-21: 50 mg via ORAL
  Filled 2016-12-19: qty 1

## 2016-12-19 MED ORDER — ACETAMINOPHEN 325 MG PO TABS
650.0000 mg | ORAL_TABLET | Freq: Four times a day (QID) | ORAL | Status: DC | PRN
  Administered 2016-12-20 (×2): 650 mg via ORAL
  Filled 2016-12-19 (×2): qty 2

## 2016-12-19 MED ORDER — VANCOMYCIN HCL 500 MG IV SOLR
500.0000 mg | Freq: Two times a day (BID) | INTRAVENOUS | Status: DC
  Administered 2016-12-20 – 2016-12-21 (×3): 500 mg via INTRAVENOUS
  Filled 2016-12-19 (×5): qty 500

## 2016-12-19 MED ORDER — ALUM & MAG HYDROXIDE-SIMETH 200-200-20 MG/5ML PO SUSP: 30.0000 mL | ORAL | Status: DC | PRN

## 2016-12-19 MED ORDER — GUAIFENESIN-DM 100-10 MG/5ML PO SYRP
5.0000 mL | ORAL_SOLUTION | ORAL | Status: DC | PRN
Start: 2016-12-19 — End: 2016-12-22

## 2016-12-19 MED ORDER — PIPERACILLIN-TAZOBACTAM 3.375 G IVPB 30 MIN
3.3750 g | Freq: Once | INTRAVENOUS | Status: AC
  Administered 2016-12-19: 3.375 g via INTRAVENOUS
  Filled 2016-12-19: qty 50

## 2016-12-19 MED ORDER — HYDROCODONE-ACETAMINOPHEN 5-325 MG PO TABS: 1.0000 | ORAL_TABLET | ORAL | Status: DC | PRN

## 2016-12-19 MED ORDER — SODIUM CHLORIDE 0.9 % IV BOLUS (SEPSIS): 1500.0000 mL | Freq: Once | INTRAVENOUS | Status: DC

## 2016-12-19 MED ORDER — PIPERACILLIN-TAZOBACTAM 3.375 G IVPB 30 MIN: 3.3750 g | Freq: Once | INTRAVENOUS | Status: DC

## 2016-12-19 MED ORDER — SODIUM CHLORIDE 0.9 % IV SOLN
1000.0000 mg | Freq: Once | INTRAVENOUS | Status: AC
  Administered 2016-12-19: 1000 mg via INTRAVENOUS
  Filled 2016-12-19: qty 10

## 2016-12-19 MED ORDER — LEVETIRACETAM 500 MG PO TABS
750.0000 mg | ORAL_TABLET | Freq: Two times a day (BID) | ORAL | Status: DC
  Administered 2016-12-20 – 2016-12-21 (×4): 750 mg via ORAL
  Filled 2016-12-19 (×5): qty 1

## 2016-12-19 MED ORDER — GABAPENTIN 300 MG PO CAPS
300.0000 mg | ORAL_CAPSULE | Freq: Every day | ORAL | Status: DC
  Administered 2016-12-20 – 2016-12-21 (×2): 300 mg via ORAL
  Filled 2016-12-19 (×2): qty 1

## 2016-12-19 MED ORDER — LORAZEPAM 2 MG/ML IJ SOLN
1.0000 mg | Freq: Once | INTRAMUSCULAR | Status: AC
  Administered 2016-12-19: 1 mg via INTRAVENOUS
  Filled 2016-12-19: qty 1

## 2016-12-19 MED ORDER — INSULIN ASPART 100 UNIT/ML ~~LOC~~ SOLN: 0.0000 [IU] | SUBCUTANEOUS | Status: DC

## 2016-12-19 MED ORDER — MAGNESIUM SULFATE 2 GM/50ML IV SOLN
2.0000 g | Freq: Once | INTRAVENOUS | Status: AC
Start: 2016-12-19 — End: 2016-12-19
  Administered 2016-12-19: 2 g via INTRAVENOUS
  Filled 2016-12-19: qty 50

## 2016-12-19 MED ORDER — POTASSIUM CHLORIDE CRYS ER 20 MEQ PO TBCR
20.0000 meq | EXTENDED_RELEASE_TABLET | Freq: Every day | ORAL | Status: DC
  Administered 2016-12-20 – 2016-12-21 (×2): 20 meq via ORAL
  Filled 2016-12-19 (×2): qty 1

## 2016-12-19 MED ORDER — PANTOPRAZOLE SODIUM 40 MG PO TBEC
40.0000 mg | DELAYED_RELEASE_TABLET | Freq: Every day | ORAL | Status: DC
  Administered 2016-12-20 – 2016-12-21 (×2): 40 mg via ORAL
  Filled 2016-12-19 (×2): qty 1

## 2016-12-19 MED ORDER — VANCOMYCIN HCL IN DEXTROSE 1-5 GM/200ML-% IV SOLN
1000.0000 mg | Freq: Once | INTRAVENOUS | Status: AC
  Administered 2016-12-19: 1000 mg via INTRAVENOUS
  Filled 2016-12-19: qty 200

## 2016-12-19 MED ORDER — ENOXAPARIN SODIUM 100 MG/ML ~~LOC~~ SOLN
85.0000 mg | Freq: Two times a day (BID) | SUBCUTANEOUS | Status: DC
  Administered 2016-12-19 – 2016-12-20 (×3): 85 mg via SUBCUTANEOUS
  Filled 2016-12-19 (×4): qty 0.85

## 2016-12-19 MED ORDER — ALBUTEROL SULFATE (2.5 MG/3ML) 0.083% IN NEBU
2.5000 mg | INHALATION_SOLUTION | Freq: Four times a day (QID) | RESPIRATORY_TRACT | Status: DC | PRN
Start: 2016-12-19 — End: 2016-12-22

## 2016-12-19 MED ORDER — FAMOTIDINE 20 MG PO TABS
20.0000 mg | ORAL_TABLET | Freq: Every day | ORAL | Status: DC
  Administered 2016-12-21: 20 mg via ORAL
  Filled 2016-12-19 (×2): qty 1

## 2016-12-19 MED ORDER — ONDANSETRON HCL 4 MG PO TABS: 4.0000 mg | ORAL_TABLET | Freq: Four times a day (QID) | ORAL | Status: DC | PRN

## 2016-12-19 MED ORDER — POLYETHYLENE GLYCOL 3350 17 G PO PACK
17.0000 g | PACK | Freq: Every day | ORAL | Status: DC
  Filled 2016-12-19: qty 1

## 2016-12-19 NOTE — ED Notes (Signed)
2 failed attempts to collect labs 

## 2016-12-19 NOTE — Progress Notes (Signed)
Pharmacy Antibiotic Note  Annette Farmer is a 71 y.o. female admitted on 12/19/2016 with sepsis.  Pharmacy has been consulted for Vancomycin & Zosyn dosing.  Plan: Vancomycin 1gm x1, then 500mg   IV every 12 hours.  Goal trough 15-20 mcg/mL. Zosyn 3.375g IV q8h (4 hour infusion).     Temp (24hrs), Avg:97.9 F (36.6 C), Min:97.9 F (36.6 C), Max:97.9 F (36.6 C)   Recent Labs Lab 12/13/16 0900 12/19/16 1228  WBC 2.6* 15.0*  CREATININE 0.9 0.89    Estimated Creatinine Clearance: 55.7 mL/min (by C-G formula based on SCr of 0.89 mg/dL).    Allergies  Allergen Reactions  . Penicillins Nausea Only    Has patient had a PCN reaction causing immediate rash, facial/tongue/throat swelling, SOB or lightheadedness with hypotension: NO Has patient had a PCN reaction causing severe rash involving mucus membranes or skin necrosis: NO Has patient had a PCN reaction that required hospitalization: NO Has patient had a PCN reaction occurring within the last 10 years: NO If all of the above answers are "NO", then may proceed with Cephalosporin use.    Antimicrobials this admission: 7/30 Zosyn >>  7/30 Vanc  >>   Dose adjustments this admission:  Microbiology results: 7/30 BCx: collected         UCx: ordered, need collection   Thank you for allowing pharmacy to be a part of this patient's care.  Minda Ditto PharmD Pager [phone] 12/19/2016, 4:58 PM

## 2016-12-19 NOTE — ED Triage Notes (Signed)
Pt presents from [facility] after lab work from Friday reports low HGB and WBC. Staff noticed a twitching in her L arm. Alert and oriented. 22g L hand.

## 2016-12-19 NOTE — Progress Notes (Signed)
   12/19/16 2205  Vitals  BP (!) 81/47  MAP (mmHg) (!) 59  ECG Heart Rate 86  Resp 13   NP on call for [facility] informed of BP. Order given for 500 NS bolus.

## 2016-12-19 NOTE — ED Notes (Signed)
Bed: TR32 Expected date:  Expected time:  Means of arrival:  Comments: EMS-low WBC

## 2016-12-19 NOTE — Consult Note (Signed)
Neurology Consultation Reason for Consult: Seizure, twitching Referring Physician: Dr Philmore Pali OPYD  CC: Left arm and face twitching  History is obtained from Chart review, ED physician  HPI: Annette Farmer is a 71 y.o. 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  The patient presents from [place] and rehab for anemia. While in the emergency department staff noticed that her left arm was twitching along with the left side of her face. She received 1 mg of Ativan which stopped the twitching. She did not have any altered awareness, eye deviation, head turning during the twitching episode.On arrival, the patient had already received Ativan and was drowsy. She has no prior history of seizures. CT head performed did not show any acute abnormalities.   ROS: Unable to obtain due to altered mental status.   Past Medical History:  Diagnosis Date  . Depression   . Diabetes mellitus without complication ([place])   . Hypertension   . Stroke [facility])      Family History  Problem Relation Age of Onset  . Parkinson's disease Mother   . Dementia Father   . Cancer Sister 16       Breast cancer     Social History:Unable to obtain the previous charts showed no evidence of tobacco or alcohol abuse   Exam: Current vital signs: BP (!) 96/58   Pulse (!) 31   Temp 97.9 F (36.6 C) (Oral)   Resp 18   Ht 5' 1.5" (1.562 m)   Wt 66.6 kg (146 lb 13.2 oz)   SpO2 97%   BMI 27.29 kg/m  Vital signs in last 24 hours: Temp:  [97.9 F (36.6 C)] 97.9 F (36.6 C) (07/30 1211) Pulse Rate:  [31-115] 31 (07/30 1615) Resp:  [10-26] 18 (07/30 1615) BP: (79-107)/(52-76) 96/58 (07/30 1615) SpO2:  [95 %-100 %] 97 % (07/30 1615) Weight:  [66.6 kg (146 lb 13.2 oz)] 66.6 kg (146 lb 13.2 oz) (07/30 1700)   Physical Exam  Constitutional: Appears drowsy, does not appear to be in distress  Psych:  Sedated Eyes: No scleral injection HENT: No OP obstrucion Head: Normocephalic.  Cardiovascular: Normal rate and regular rhythm.  Respiratory: Effort normal and breath sounds normal to anterior ascultation GI: Soft.  No distension. There is no tenderness.  Skin: WDI  Neuro: Mental Status: Patient is awake, Drowsy, oriented to person but not to month or place. She follows simple commands.  Cranial Nerves: II: Visual Fields are full. Pupils are equal, round, and reactive to light.  III,IV, VI: EOMI without ptosis or diploplia.  V: Facial sensation is symmetric to temperature VII: Facial movement is symmetric.  VIII: hearing is intact to voice X: Uvula elevates symmetrically XI: Shoulder shrug is symmetric. XII: tongue is midline without atrophy or fasciculations.  Motor: Tone is normal. Bulk is normal. She was moving all 4 extremities symmetrically Sensory: Sensation is symmetric to light touch and temperature in the arms and legs Deep Tendon Reflexes: 2+ and symmetric in the biceps and patellae.  Plantars: Toes are downgoing bilaterally.  Cerebellar: Unable to assess   I have reviewed labs in epic and the results pertinent to this consultation are: I have reviewed CT images   Assessment   Annette Farmer is a 71 y.o. 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 While not having personally witnessed the event, based on the description from the ED physician was concerning for focal seizures.   Recommendations  Obtaining an MRI brain with and without contrast to rule out intracranial metastasis given history of ovarian cancer. Recommend loading with Keppra for 1 g and maintenance of 750 mg twice a day.  Seizure precautions and every 4 hours neurochecks Metabolic and infectious workup Routine EEG  We'll continue to follow.

## 2016-12-19 NOTE — ED Provider Notes (Signed)
[facility] DEPT Provider Note   CSN: [id number] Arrival date & time: 12/19/16  1158     History   Chief Complaint Chief Complaint  Patient presents with  . Abnormal Lab    HPI Annette Farmer is a 71 y.o. female.  HPI  71 year old female with a history of ovarian cancer, pulmonary embolus, CVA, diabetes, hypertension, who presents with concern for twitching. Patient reports out she is feeling well, last night at midnight she began to have jerking or twitching. They noted that her hemoglobin was 6.9 on her labs from Friday and sent her here for evaluation. Patient reports she is feeling well, and at midnight she began to develop jerking. Reports she told her nurse, however there initially observed. Reports that the episodes of jerking have become more frequent over the course of the night, and she's having severe involuntarily jerking which is preventing her from lifting things, feeding herself. Denies loss of consciousness, headache. Reports she's had nausea and vomiting but is not sure if this is secondary to her cancer treatment. Denies fevers, cough, urinary symptoms. Reports she does not believe she is black or bloody stool, but reports they have been changing her at the facility so she is not sure. Reports no prior history of myoclonus, no history of seizures. No known brain metastases.   Past Medical History:  Diagnosis Date  . Depression   . Diabetes mellitus without complication ([place])   . Hypertension   . Stroke [facility])     Patient Active Problem List   Diagnosis Date Noted  . Hypokalemia 12/19/2016  . SIRS (systemic inflammatory response syndrome) ([place]) 12/19/2016  . Focal seizures ([place]) 12/19/2016  . Normocytic anemia 12/19/2016  . Hypotension 12/19/2016  . Failure to thrive in adult 12/13/2016  . Peritonitis ([place]) 12/02/2016  . Peritoneal carcinomatosis ([place])   . Ovarian cancer on right [facility]): Stage 3C 11/16/2016  . Ileus ([place]) 11/16/2016  . Acute  thromboembolism of deep veins of right lower extremity (HCC)   . Port-a-cath in place   . DNR (do not resuscitate)   . Metastatic cancer ([place])   . Palliative care encounter   . Goals of care, counseling/discussion   . Bowel obstruction ([place])   . Severe protein-calorie malnutrition ([place])   . AKI (acute kidney injury) ([place]) 11/11/2016  . Pelvic mass 11/11/2016  . Carcinomatosis ([place]) 11/11/2016  . DVT (deep venous thrombosis) ([place]) 11/10/2016  . Refusal of statin medication by patient 09/28/2016  . Hemiparesis affecting right side as late effect of cerebrovascular accident ([place]) 04/23/2012  . Type 2 diabetes mellitus ([place]) 07/20/2006  . HYPERCHOLESTEROLEMIA 07/20/2006  . OBESITY, NOS 07/20/2006  . HYPERTENSION, BENIGN SYSTEMIC 07/20/2006  . HEMORRHOIDS, NOS 07/20/2006  . CONSTIPATION 07/20/2006  . INCONTINENCE, STRESS, FEMALE 07/20/2006  . MENOPAUSAL SYNDROME 07/20/2006    Past Surgical History:  Procedure Laterality Date  . HERNIA REPAIR    . IR FLUORO GUIDE PORT INSERTION LEFT  11/25/2016  . IR PARACENTESIS  11/11/2016  . IR US GUIDE VASC ACCESS LEFT  11/25/2016  . UMBILICAL HERNIA REPAIR      OB History    No data available       Home Medications    Prior to Admission medications   Medication Sig Start Date End Date Taking? Authorizing Provider  acetaminophen (TYLENOL) 325 MG tablet Take 2 tablets (650 mg total) by mouth every 6 (six) hours as needed for mild pain (or Fever >/= 101). 11/29/16  Yes Robbie Lis, MD  albuterol (PROVENTIL) (2.5 MG/3ML) 0.083% nebulizer solution Take 3 mLs (2.5 mg total) by nebulization every 6 (six) hours as needed for wheezing or shortness of breath. 11/29/16  Yes Robbie Lis, MD  alum & mag hydroxide-simeth (MAALOX/MYLANTA) 200-200-20 MG/5ML suspension Take 30 mLs by mouth every 4 (four) hours as needed for indigestion or heartburn. 11/29/16  Yes Robbie Lis, MD  bisacodyl (DULCOLAX) 10 MG suppository Place 1 suppository (10 mg total)  rectally daily. 11/29/16  Yes Robbie Lis, MD  COLLAGEN PO Take 6 tablets by mouth daily.   Yes [provider]  enoxaparin (LOVENOX) 100 MG/ML injection Inject 0.85 mLs (85 mg total) into the skin every 12 (twelve) hours. 11/29/16  Yes Robbie Lis, MD  feeding supplement (BOOST / RESOURCE BREEZE) LIQD Take 1 Container by mouth 3 (three) times daily with meals. Patient taking differently: Take 237 mLs by mouth 3 (three) times daily with meals.  12/07/16  Yes Aline August, MD  gabapentin (NEURONTIN) 300 MG capsule Take 1 capsule (300 mg total) by mouth at bedtime. 10/04/16  Yes Wardell Honour, MD  guaiFENesin-dextromethorphan [facility] DM) 100-10 MG/5ML syrup Take 5 mLs by mouth every 4 (four) hours as needed for cough. 11/29/16  Yes Robbie Lis, MD  insulin aspart (NOVOLOG) 100 UNIT/ML injection Inject 0-9 Units into the skin 3 (three) times daily with meals. 11/29/16  Yes Robbie Lis, MD  LASIX 40 MG tablet Take 1 tablet (40 mg total) by mouth 2 (two) times daily. 12/07/16 01/06/17 Yes Aline August, MD  metoCLOPramide (REGLAN) 10 MG tablet Take 0.5 tablets (5 mg total) by mouth 3 (three) times daily before meals. 12/13/16  Yes Gorsuch, Ni, MD  metoprolol succinate (TOPROL-XL) 25 MG 24 hr tablet Take 1 tablet (25 mg total) by mouth daily. 10/17/16  Yes Wardell Honour, MD  mirtazapine (REMERON) 15 MG tablet Take 15 mg by mouth at bedtime.   Yes [provider]  ondansetron (ZOFRAN) 4 MG tablet Take 1 tablet (4 mg total) by mouth daily as needed for nausea or vomiting. 11/29/16 11/29/17 Yes Robbie Lis, MD  pantoprazole (PROTONIX) 40 MG tablet Take 1 tablet (40 mg total) by mouth daily at 6 (six) AM. 11/30/16  Yes Robbie Lis, MD  polyethylene glycol [facility] Floria Raveling) packet Take 17 g by mouth daily. 12/08/16  Yes Aline August, MD  potassium chloride SA (K-DUR,KLOR-CON) 20 MEQ tablet Take 1 tablet (20 mEq total) by mouth daily. 12/07/16  Yes Aline August, MD    ranitidine (ZANTAC) 75 MG tablet Take 75 mg by mouth once.   Yes [provider]  senna-docusate (SENOKOT-S) 8.6-50 MG tablet Take 2 tablets by mouth 2 (two) times daily. 12/07/16  Yes Aline August, MD  traMADol (ULTRAM) 50 MG tablet Take 1 tablet (50 mg total) by mouth every 6 (six) hours as needed for moderate pain. 12/07/16  Yes Aline August, MD    Family History Family History  Problem Relation Age of Onset  . Parkinson's disease Mother   . Dementia Father   . Cancer Sister 30       Breast cancer    Social History Social History  Substance Use Topics  . Smoking status: Never Smoker  . Smokeless tobacco: Never Used  . Alcohol use No     Allergies   Penicillins   Review of Systems Review of Systems  Constitutional: Negative for fever.  HENT: Negative for sore throat.   Eyes: Negative for  visual disturbance.  Respiratory: Negative for cough and shortness of breath.   Cardiovascular: Negative for chest pain.  Gastrointestinal: Positive for nausea and vomiting (but not sure if due to chemo). Negative for abdominal pain and diarrhea.  Genitourinary: Negative for difficulty urinating and dysuria.  Musculoskeletal: Negative for back pain and neck pain.  Skin: Negative for rash.  Neurological: Positive for tremors. Negative for syncope and headaches.     Physical Exam Updated Vital Signs BP (!) 96/58   Pulse (!) 31   Temp 97.9 F (36.6 C) (Oral)   Resp 18   Ht 5' 1.5" (1.562 m)   Wt 66.6 kg (146 lb 13.2 oz)   SpO2 97%   BMI 27.29 kg/m   Physical Exam  Constitutional: She is oriented to person, place, and time. She appears well-developed and well-nourished. No distress.  HENT:  Head: Normocephalic and atraumatic.  Eyes: Conjunctivae and EOM are normal.  Neck: Normal range of motion.  Cardiovascular: Normal rate, regular rhythm, normal heart sounds and intact distal pulses.  Exam reveals no gallop and no friction rub.   No murmur  heard. Pulmonary/Chest: Effort normal and breath sounds normal. No respiratory distress. She has no wheezes. She has no rales.  Abdominal: Soft. She exhibits no distension. There is no tenderness. There is no guarding.  Musculoskeletal: She exhibits no edema or tenderness.  Neurological: She is alert and oriented to person, place, and time.  Frequent myoclonic jerking noted left face and left arm Exam nonfocal, patient with occasional loss of control of left arm during evaluation, at one point dropped both arms with jerk but otherwise focal appearance of twitching to left face and arm, every few seconds has one second episode   Skin: Skin is warm and dry. No rash noted. She is not diaphoretic. No erythema.  Nursing note and vitals reviewed.    ED Treatments / Results  Labs (all labs ordered are listed, but only abnormal results are displayed) Labs Reviewed  CBC WITH DIFFERENTIAL/PLATELET - Abnormal; Notable for the following:       Result Value   WBC 15.0 (*)    RBC 2.82 (*)    Hemoglobin 8.4 (*)    HCT 26.2 (*)    RDW 18.9 (*)    Neutro Abs 12.2 (*)    Monocytes Absolute 1.4 (*)    All other components within normal limits  COMPREHENSIVE METABOLIC PANEL - Abnormal; Notable for the following:    Sodium 134 (*)    Potassium 2.8 (*)    Chloride 97 (*)    Calcium 8.0 (*)    Total Protein 6.0 (*)    Albumin 2.9 (*)    All other components within normal limits  CULTURE, BLOOD (ROUTINE X 2)  CULTURE, BLOOD (ROUTINE X 2)  URINE CULTURE  MRSA PCR SCREENING  AMMONIA  URINALYSIS, ROUTINE W REFLEX MICROSCOPIC  LACTIC ACID, PLASMA  LACTIC ACID, PLASMA  PROTIME-INR  APTT  CBC WITH DIFFERENTIAL/PLATELET  COMPREHENSIVE METABOLIC PANEL  TYPE AND SCREEN    EKG  EKG Interpretation None       Radiology Ct Head Wo Contrast  Result Date: 12/19/2016 CLINICAL DATA:  Left arm twitching today. EXAM: CT HEAD WITHOUT CONTRAST TECHNIQUE: Contiguous axial images were obtained from the  base of the skull through the vertex without intravenous contrast. COMPARISON:  Head CT scan 01/09/2011.  Brain MRI 01/10/2011. FINDINGS: Brain: There is chronic microvascular ischemic change and mild atrophy. No evidence of acute abnormality including hemorrhage, infarct,  mass lesion, mass effect, midline shift or abnormal extra-axial fluid collection. No hydrocephalus or pneumocephalus. Vascular: Atherosclerosis noted. Skull: Intact. Sinuses/Orbits: Negative. Other: None. IMPRESSION: No acute abnormality. Atrophy and chronic microvascular ischemic change. Atherosclerosis. Electronically Signed   By: Inge Rise M.D.   On: 12/19/2016 13:47    Procedures Procedures (including critical care time)  Medications Ordered in ED Medications  potassium chloride 10 mEq in 100 mL IVPB (10 mEq Intravenous New Bag/Given 12/19/16 1756)  levETIRAcetam (KEPPRA) tablet 750 mg (not administered)  mirtazapine (REMERON) tablet 15 mg (not administered)  metoCLOPramide (REGLAN) tablet 5 mg (not administered)  feeding supplement (BOOST / RESOURCE BREEZE) liquid 1 Container (not administered)  polyethylene glycol (MIRALAX / GLYCOLAX) packet 17 g (not administered)  potassium chloride SA (K-DUR,KLOR-CON) CR tablet 20 mEq (not administered)  famotidine (PEPCID) tablet 20 mg (not administered)  senna-docusate (Senokot-S) tablet 2 tablet (not administered)  traMADol (ULTRAM) tablet 50 mg (not administered)  albuterol (PROVENTIL) (2.5 MG/3ML) 0.083% nebulizer solution 2.5 mg (not administered)  alum & mag hydroxide-simeth (MAALOX/MYLANTA) 200-200-20 MG/5ML suspension 30 mL (not administered)  bisacodyl (DULCOLAX) suppository 10 mg (not administered)  enoxaparin (LOVENOX) injection 85 mg (not administered)  guaiFENesin-dextromethorphan (ROBITUSSIN DM) 100-10 MG/5ML syrup 5 mL (not administered)  pantoprazole (PROTONIX) EC tablet 40 mg (not administered)  gabapentin (NEURONTIN) capsule 300 mg (not administered)   sodium chloride flush (NS) 0.9 % injection 3 mL (not administered)  acetaminophen (TYLENOL) tablet 650 mg (not administered)    Or  acetaminophen (TYLENOL) suppository 650 mg (not administered)  HYDROcodone-acetaminophen (NORCO/VICODIN) 5-325 MG per tablet 1-2 tablet (not administered)  ondansetron (ZOFRAN) tablet 4 mg (not administered)    Or  ondansetron (ZOFRAN) injection 4 mg (not administered)  sodium chloride 0.9 % bolus 1,500 mL (not administered)  piperacillin-tazobactam (ZOSYN) IVPB 3.375 g (not administered)  vancomycin (VANCOCIN) IVPB 1000 mg/200 mL premix (not administered)  insulin aspart (novoLOG) injection 0-9 Units (not administered)  LORazepam (ATIVAN) injection 0.5-1 mg (not administered)  magnesium sulfate IVPB 2 g 50 mL (not administered)  LORazepam (ATIVAN) injection 1 mg (1 mg Intravenous Given 12/19/16 1318)  levETIRAcetam (KEPPRA) 1,000 mg in sodium chloride 0.9 % 100 mL IVPB (1,000 mg Intravenous New Bag/Given 12/19/16 1756)     Initial Impression / Assessment and Plan / ED Course  I have reviewed the triage vital signs and the nursing notes.  Pertinent labs & imaging results that were available during my care of the patient were reviewed by me and considered in my medical decision making (see chart for details).     71 year old female with a history of ovarian cancer, pulmonary embolus, CVA, diabetes, hypertension, who presents with concern for twitching. Patient reports out she is feeling well, last night at midnight she began to have jerking or twitching. They noted that her hemoglobin was 6.9 on her labs from Friday and sent her here for evaluation.  On my evaluation, patient has continuous twitching of the left face, and left upper extremity. Pelvis is most consistent with seizure-like activity. Ordered head CT and Ativan to treat possible focal seizure versus other myoclonic jerk.  Given focality on my exam feel this most likely represents focal seizure,  however patient had noted symptoms on the other side. Ammonia ordered and pending. Ativan helped resolve the myoclonic activity noted, however patient became somnolent.  CT head shows no acute abnormalities. Discussed with neurology, and will order MRI WWO contrast, keppra.    Patient with decrease in blood pressure and somnolence following  Ativan. Doubt other etiology of decreased BP, given timing with medication, no history to suggest infection, normal mental status and blood pressures prior to ativan. No hx to suggest GI bleed, hgb stable here. She is continuing to protect her airway, arouses to stimulation, however is sleepy. Her blood pressures decreased to the 47S systolic, and responding to fluid, likely also secondary to medication.  We'll admit for continued observation, workup for focal seizures, altered mental status.   Final Clinical Impressions(s) / ED Diagnoses   Final diagnoses:  Focal seizure [facility])  Encephalopathy    New Prescriptions Current Discharge Medication List       Gareth Morgan, MD 12/19/16 [phone]

## 2016-12-19 NOTE — ED Notes (Signed)
ED Provider at bedside. Notified of BP.  

## 2016-12-19 NOTE — ED Notes (Signed)
Patient transported to CT 

## 2016-12-19 NOTE — H&P (Signed)
History and Physical    Annette Farmer TMA:[id number] DOB: Feb 10, 1946 DOA: 12/19/2016  PCP: Wardell Honour, MD   Patient coming from: SNF   Chief Complaint: Abnormal labs, twitching   HPI: Annette Farmer is a 71 y.o. female with medical history significant for metastatic ovarian cancer, insulin-dependent diabetes mellitus, hypertension, GERD, and DVT on therapeutic Lovenox, presenting from her nursing facility for evaluation of abnormal blood work and twitching movements. Patient was reportedly observed to have a twitching or jerking involving her left arm and left face today at the nursing home and had blood work drawn that revealed elevated WBC and low hemoglobin. She was sent into the ED for evaluation of this. There was no known fall or trauma recently, no fever documented, and no other concerns raised by SNF personnel.  ED Course: Upon arrival to the ED, patient is found to be afebrile, saturating well on room air, tachycardic to 110s, and with blood pressure 79/58. EKG features a sinus tachycardia with rate 106 and aberrant complexes. Chemistry panels notable for a sodium of 134 and potassium of 2.8. CBC features a new leukocytosis to 15,000 and a stable normocytic anemia with hemoglobin of 8.4. Blood cultures obtained in the emergency department, type and screen was performed, and the patient was treated with 30 mEq of IV potassium. She was observed to have the jerking of the left arm and face and was treated with 1 mg IV Ativan. The jerking movements resolved after Ativan was administered but the patient became very somnolent and unable to contribute to the history at that point. Neurology was consulted by the ED physician, suspects a focal seizure, and recommends medical admission with MRI and Keppra. Patient was loaded with Keppra in the ED. Blood pressure improved some with IV fluids. There has not been any apparent respiratory distress. She will be admitted to the stepdown unit for ongoing  evaluation and management of suspected focal seizures and SIRS with hypotension.  Review of Systems:  Unable to obtain secondary to the patient's clinical condition.  Past Medical History:  Diagnosis Date  . Depression   . Diabetes mellitus without complication ([place])   . Hypertension   . Stroke [facility])     Past Surgical History:  Procedure Laterality Date  . HERNIA REPAIR    . IR FLUORO GUIDE PORT INSERTION LEFT  11/25/2016  . IR PARACENTESIS  11/11/2016  . IR US GUIDE VASC ACCESS LEFT  11/25/2016  . UMBILICAL HERNIA REPAIR       reports that she has never smoked. She has never used smokeless tobacco. She reports that she does not drink alcohol or use drugs.  Allergies  Allergen Reactions  . Penicillins Nausea Only    Has patient had a PCN reaction causing immediate rash, facial/tongue/throat swelling, SOB or lightheadedness with hypotension: NO Has patient had a PCN reaction causing severe rash involving mucus membranes or skin necrosis: NO Has patient had a PCN reaction that required hospitalization: NO Has patient had a PCN reaction occurring within the last 10 years: NO If all of the above answers are "NO", then may proceed with Cephalosporin use.     Family History  Problem Relation Age of Onset  . Parkinson's disease Mother   . Dementia Father   . Cancer Sister 6       Breast cancer     Prior to Admission medications   Medication Sig Start Date End Date Taking? Authorizing Provider  acetaminophen (TYLENOL) 325 MG tablet Take  2 tablets (650 mg total) by mouth every 6 (six) hours as needed for mild pain (or Fever >/= 101). 11/29/16  Yes Robbie Lis, MD  albuterol (PROVENTIL) (2.5 MG/3ML) 0.083% nebulizer solution Take 3 mLs (2.5 mg total) by nebulization every 6 (six) hours as needed for wheezing or shortness of breath. 11/29/16  Yes Robbie Lis, MD  alum & mag hydroxide-simeth (MAALOX/MYLANTA) 200-200-20 MG/5ML suspension Take 30 mLs by mouth every 4 (four) hours as  needed for indigestion or heartburn. 11/29/16  Yes Robbie Lis, MD  bisacodyl (DULCOLAX) 10 MG suppository Place 1 suppository (10 mg total) rectally daily. 11/29/16  Yes Robbie Lis, MD  COLLAGEN PO Take 6 tablets by mouth daily.   Yes [provider]  enoxaparin (LOVENOX) 100 MG/ML injection Inject 0.85 mLs (85 mg total) into the skin every 12 (twelve) hours. 11/29/16  Yes Robbie Lis, MD  feeding supplement (BOOST / RESOURCE BREEZE) LIQD Take 1 Container by mouth 3 (three) times daily with meals. Patient taking differently: Take 237 mLs by mouth 3 (three) times daily with meals.  12/07/16  Yes Aline August, MD  gabapentin (NEURONTIN) 300 MG capsule Take 1 capsule (300 mg total) by mouth at bedtime. 10/04/16  Yes Wardell Honour, MD  guaiFENesin-dextromethorphan [facility] DM) 100-10 MG/5ML syrup Take 5 mLs by mouth every 4 (four) hours as needed for cough. 11/29/16  Yes Robbie Lis, MD  insulin aspart (NOVOLOG) 100 UNIT/ML injection Inject 0-9 Units into the skin 3 (three) times daily with meals. 11/29/16  Yes Robbie Lis, MD  LASIX 40 MG tablet Take 1 tablet (40 mg total) by mouth 2 (two) times daily. 12/07/16 01/06/17 Yes Aline August, MD  metoCLOPramide (REGLAN) 10 MG tablet Take 0.5 tablets (5 mg total) by mouth 3 (three) times daily before meals. 12/13/16  Yes Gorsuch, Ni, MD  metoprolol succinate (TOPROL-XL) 25 MG 24 hr tablet Take 1 tablet (25 mg total) by mouth daily. 10/17/16  Yes Wardell Honour, MD  mirtazapine (REMERON) 15 MG tablet Take 15 mg by mouth at bedtime.   Yes [provider]  ondansetron (ZOFRAN) 4 MG tablet Take 1 tablet (4 mg total) by mouth daily as needed for nausea or vomiting. 11/29/16 11/29/17 Yes Robbie Lis, MD  pantoprazole (PROTONIX) 40 MG tablet Take 1 tablet (40 mg total) by mouth daily at 6 (six) AM. 11/30/16  Yes Robbie Lis, MD  polyethylene glycol [name] Floria Raveling) packet Take 17 g by mouth daily. 12/08/16  Yes Aline August,  MD  potassium chloride SA (K-DUR,KLOR-CON) 20 MEQ tablet Take 1 tablet (20 mEq total) by mouth daily. 12/07/16  Yes Aline August, MD  ranitidine (ZANTAC) 75 MG tablet Take 75 mg by mouth once.   Yes [provider]  senna-docusate (SENOKOT-S) 8.6-50 MG tablet Take 2 tablets by mouth 2 (two) times daily. 12/07/16  Yes Aline August, MD  traMADol (ULTRAM) 50 MG tablet Take 1 tablet (50 mg total) by mouth every 6 (six) hours as needed for moderate pain. 12/07/16  Yes Aline August, MD    Physical Exam: Vitals:   12/19/16 1530 12/19/16 1545 12/19/16 1600 12/19/16 1615  BP: (!) 88/63 (!) 92/52 (!) 88/63 (!) 96/58  Pulse: 94 (!) 31 (!) 31 (!) 31  Resp: 13 10 11 18   Temp:      TempSrc:      SpO2: 100% 100% 98% 97%      Constitutional: No respiratory distress. Somnolent, easily  roused. Appears chronically-ill.  Eyes: PERTLA, lids and conjunctivae normal ENMT: Mucous membranes are moist. Posterior pharynx clear of any exudate or lesions.   Neck: normal, supple, no masses, no thyromegaly Respiratory: clear to auscultation bilaterally, no wheezing, no crackles. Normal respiratory effort.   Cardiovascular: Rate ~110 and irregular. Trace pretibial edema bilaterally. No significant JVD. Abdomen: No distension, no tenderness, no masses palpated. Bowel sounds normal.  Musculoskeletal: no clubbing / cyanosis. No joint deformity upper and lower extremities.    Skin: no significant rashes, lesions, ulcers. Warm, dry, well-perfused. Neurologic: no gross facial asymmetry. PERRL, sensation intact, patellar DTR's normal. Somnolent, rouses and makes eye-contact briefly.   Psychiatric: Difficult to assess given the clinical scenario.     Labs on Admission: I have personally reviewed following labs and imaging studies  CBC:  Recent Labs Lab 12/13/16 0900 12/19/16 1228  WBC 2.6* 15.0*  NEUTROABS 2.3 12.2*  HGB 8.7* 8.4*  HCT 26.4* 26.2*  MCV 90.7 92.9  PLT 225 671   Basic Metabolic  Panel:  Recent Labs Lab 12/13/16 0900 12/19/16 1228  NA 133* 134*  K 4.2 2.8*  CL  --  97*  CO2 22 29  GLUCOSE 190* 97  BUN 16.7 8  CREATININE 0.9 0.89  CALCIUM 8.5 8.0*   GFR: Estimated Creatinine Clearance: 55.7 mL/min (by C-G formula based on SCr of 0.89 mg/dL). Liver Function Tests:  Recent Labs Lab 12/13/16 0900 12/19/16 1228  AST 70* 30  ALT 53 28  ALKPHOS 140 119  BILITOT 0.80 0.6  PROT 6.0* 6.0*  ALBUMIN 2.8* 2.9*   No results for input(s): LIPASE, AMYLASE in the last 168 hours. No results for input(s): AMMONIA in the last 168 hours. Coagulation Profile: No results for input(s): INR, PROTIME in the last 168 hours. Cardiac Enzymes: No results for input(s): CKTOTAL, CKMB, CKMBINDEX, TROPONINI in the last 168 hours. BNP (last 3 results) No results for input(s): PROBNP in the last 8760 hours. HbA1C: No results for input(s): HGBA1C in the last 72 hours. CBG: No results for input(s): GLUCAP in the last 168 hours. Lipid Profile: No results for input(s): CHOL, HDL, LDLCALC, TRIG, CHOLHDL, LDLDIRECT in the last 72 hours. Thyroid Function Tests: No results for input(s): TSH, T4TOTAL, FREET4, T3FREE, THYROIDAB in the last 72 hours. Anemia Panel: No results for input(s): VITAMINB12, FOLATE, FERRITIN, TIBC, IRON, RETICCTPCT in the last 72 hours. Urine analysis:    Component Value Date/Time   COLORURINE YELLOW 12/02/2016 1643   APPEARANCEUR HAZY (A) 12/02/2016 1643   LABSPEC 1.028 12/02/2016 1643   PHURINE 5.0 12/02/2016 1643   GLUCOSEU 50 (A) 12/02/2016 1643   HGBUR SMALL (A) 12/02/2016 1643   BILIRUBINUR NEGATIVE 12/02/2016 1643   BILIRUBINUR negative 01/27/2016 1759   BILIRUBINUR neg 04/08/2012 1424   KETONESUR 20 (A) 12/02/2016 1643   PROTEINUR 30 (A) 12/02/2016 1643   UROBILINOGEN 0.2 01/27/2016 1759   UROBILINOGEN 1.0 01/06/2011 0008   NITRITE NEGATIVE 12/02/2016 1643   LEUKOCYTESUR NEGATIVE 12/02/2016 1643   Sepsis  Labs: @LABRCNTIP (procalcitonin:4,lacticidven:4) )No results found for this or any previous visit (from the past 240 hour(s)).   Radiological Exams on Admission: Ct Head Wo Contrast  Result Date: 12/19/2016 CLINICAL DATA:  Left arm twitching today. EXAM: CT HEAD WITHOUT CONTRAST TECHNIQUE: Contiguous axial images were obtained from the base of the skull through the vertex without intravenous contrast. COMPARISON:  Head CT scan 01/09/2011.  Brain MRI 01/10/2011. FINDINGS: Brain: There is chronic microvascular ischemic change and mild atrophy. No evidence of  acute abnormality including hemorrhage, infarct, mass lesion, mass effect, midline shift or abnormal extra-axial fluid collection. No hydrocephalus or pneumocephalus. Vascular: Atherosclerosis noted. Skull: Intact. Sinuses/Orbits: Negative. Other: None. IMPRESSION: No acute abnormality. Atrophy and chronic microvascular ischemic change. Atherosclerosis. Electronically Signed   By: Inge Rise M.D.   On: 12/19/2016 13:47    EKG: Independently reviewed. Sinus tachycardia (rate 106), aberrant complexes.   Assessment/Plan   1. Focal seizures  - Pt presents from her SNF with jerking movement of left arm and left face  - Head CT was negative for acute intracranial abnormality  - She was treated with 1 mg Ativan in ED and the movements stopped  - Neurology is consulting and much appreciated; MRI and Keppra recommended  - Patient was loaded with Keppra and MRI brain ordered    2. SIRS  - Tachycardia, leukocytosis, and hypotension noted on arrival  - Blood cultures were collected in ED - Plan to evaluate with UA, CXR, lactic acid, and start empiric abx   3. Hypotension, hx of HTN   - Pt has hx of HTN treated with Toprol - BP is 79/58 on presentation  - Concern for sepsis as above; she will be treated with 30 cc/kg NS and started on empiric abx pending CXR, UA, and cultures - Hold Lasix and Toprol   4. Metastatic ovarian cancer  - She  had been followed by oncology, but per recent note, she is no longer a candidate for chemotherapy d/t progressive FTT   5. Anemia  - Hgb is 8.4 on admission  - Stable and with no apparent bleeding  - Likely secondary to chronic disease    6. Hypokalemia  - Serum potassium is 2.8 on admission - There are abberant complexes on EKG  - She was given 30 mEq IV potassium and 1 g IV magnesium  - Monitor on telemetry and repeat chem panel in am    7. DVT - Pt has a RLE DVT treated with Lovenox, will continue - No evidence for acute VTE   8. Insulin-dependent DM  - A1c was only 5.9% in May '18  - Managed at the SNF with Novlog 0-9 units TID per sliding-scale  - Follow q4h CBG until appropriate for diet - Continue a low-intensity Novolog sliding-scale    DVT prophylaxis: sq Lovenox  Code Status: DNR Family Communication: Discussed with patient Disposition Plan: Admit to SDU Consults called: Neurology Admission status: Inpatient    Vianne Bulls, MD [facility] Pager [phone]  If 7PM-7AM, please contact night-coverage [email or web address] Password [email or web address]  12/19/2016, 4:52 PM

## 2016-12-20 ENCOUNTER — Inpatient Hospital Stay (HOSPITAL_COMMUNITY)
Admit: 2016-12-20 | Discharge: 2016-12-20 | Disposition: A | Discharge status: Home / Self Care | Payer: Medicare PPO | Attending: Family Medicine | Admitting: Family Medicine

## 2016-12-20 ENCOUNTER — Inpatient Hospital Stay (HOSPITAL_COMMUNITY): Payer: Medicare PPO

## 2016-12-20 DIAGNOSIS — I10 Essential (primary) hypertension: Secondary | ICD-10-CM

## 2016-12-20 DIAGNOSIS — R253 Fasciculation: Secondary | ICD-10-CM

## 2016-12-20 DIAGNOSIS — C799 Secondary malignant neoplasm of unspecified site: Secondary | ICD-10-CM

## 2016-12-20 DIAGNOSIS — Z515 Encounter for palliative care: Secondary | ICD-10-CM

## 2016-12-20 LAB — ECHOCARDIOGRAM COMPLETE
HEIGHTINCHES: 61.5 in
Weight: 2430.35 oz

## 2016-12-20 LAB — COMPREHENSIVE METABOLIC PANEL
ALT: 23 U/L (ref 14–54)
AST: 27 U/L (ref 15–41)
Albumin: 2.4 g/dL — ABNORMAL LOW (ref 3.5–5.0)
Alkaline Phosphatase: 114 U/L (ref 38–126)
Anion gap: 8 (ref 5–15)
BUN: 8 mg/dL (ref 6–20)
CHLORIDE: 104 mmol/L (ref 101–111)
CO2: 26 mmol/L (ref 22–32)
CREATININE: 0.79 mg/dL (ref 0.44–1.00)
Calcium: 7.7 mg/dL — ABNORMAL LOW (ref 8.9–10.3)
GFR calc Af Amer: >60 mL/min (ref >60)
GFR calc non Af Amer: >60 mL/min (ref >60)
Glucose, Bld: 97 mg/dL (ref 65–99)
Potassium: 3.5 mmol/L (ref 3.5–5.1)
SODIUM: 138 mmol/L (ref 135–145)
Total Bilirubin: 0.6 mg/dL (ref 0.3–1.2)
Total Protein: 4.9 g/dL — ABNORMAL LOW (ref 6.5–8.1)

## 2016-12-20 LAB — CBC WITH DIFFERENTIAL/PLATELET
Basophils Absolute: 0 10*3/uL (ref 0.0–0.1)
Basophils Relative: 0 %
EOS ABS: 0 10*3/uL (ref 0.0–0.7)
EOS PCT: 0 %
HCT: 24.7 % — ABNORMAL LOW (ref 36.0–46.0)
HEMOGLOBIN: 8 g/dL — AB (ref 12.0–15.0)
LYMPHS PCT: 9 %
Lymphs Abs: 1.2 10*3/uL (ref 0.7–4.0)
MCH: 30.8 pg (ref 26.0–34.0)
MCHC: 32.4 g/dL (ref 30.0–36.0)
MCV: 95 fL (ref 78.0–100.0)
MONO ABS: 0.8 10*3/uL (ref 0.1–1.0)
Monocytes Relative: 6 %
NEUTROS PCT: 85 %
Neutro Abs: 11.5 10*3/uL — ABNORMAL HIGH (ref 1.7–7.7)
PLATELETS: 141 10*3/uL — AB (ref 150–400)
RBC: 2.6 MIL/uL — AB (ref 3.87–5.11)
RDW: 19.7 % — ABNORMAL HIGH (ref 11.5–15.5)
WBC: 13.5 10*3/uL — AB (ref 4.0–10.5)

## 2016-12-20 LAB — GLUCOSE, CAPILLARY
GLUCOSE-CAPILLARY: 83 mg/dL (ref 65–99)
GLUCOSE-CAPILLARY: 90 mg/dL (ref 65–99)
GLUCOSE-CAPILLARY: 90 mg/dL (ref 65–99)
GLUCOSE-CAPILLARY: 90 mg/dL (ref 65–99)
GLUCOSE-CAPILLARY: 92 mg/dL (ref 65–99)
GLUCOSE-CAPILLARY: 92 mg/dL (ref 65–99)

## 2016-12-20 LAB — LACTIC ACID, PLASMA: Lactic Acid, Venous: 0.9 mmol/L (ref 0.5–1.9)

## 2016-12-20 MED ORDER — HYDROCODONE-ACETAMINOPHEN 5-325 MG PO TABS
1.0000 | ORAL_TABLET | ORAL | Status: DC | PRN
  Administered 2016-12-20 – 2016-12-21 (×2): 1 via ORAL
  Administered 2016-12-21: 2 via ORAL
  Filled 2016-12-20: qty 1
  Filled 2016-12-20: qty 2
  Filled 2016-12-20: qty 1

## 2016-12-20 MED ORDER — ASPIRIN 300 MG RE SUPP
300.0000 mg | Freq: Every day | RECTAL | Status: DC
  Administered 2016-12-20 – 2016-12-21 (×2): 300 mg via RECTAL
  Filled 2016-12-20 (×2): qty 1

## 2016-12-20 MED ORDER — DEXTROSE 5 % IV SOLN
1.0000 g | Freq: Three times a day (TID) | INTRAVENOUS | Status: DC
  Administered 2016-12-20 – 2016-12-21 (×3): 1 g via INTRAVENOUS
  Filled 2016-12-20 (×5): qty 1

## 2016-12-20 MED ORDER — STROKE: EARLY STAGES OF RECOVERY BOOK
Freq: Once | Status: AC
  Administered 2016-12-20: 14:00:00
  Filled 2016-12-20: qty 1

## 2016-12-20 MED ORDER — CHLORHEXIDINE GLUCONATE CLOTH 2 % EX PADS
6.0000 | MEDICATED_PAD | Freq: Every day | CUTANEOUS | Status: DC
  Administered 2016-12-20 – 2016-12-21 (×2): 6 via TOPICAL

## 2016-12-20 MED ORDER — SODIUM CHLORIDE 0.9 % IV SOLN
INTRAVENOUS | Status: DC
  Administered 2016-12-20: 18:00:00 via INTRAVENOUS

## 2016-12-20 NOTE — Evaluation (Signed)
Physical Therapy Evaluation Patient Details Name: Annette Farmer MRN: [id number] DOB: Nov 27, 1945 Today's Date: 12/20/2016   History of Present Illness  Annette Farmer is a 71 y.o. female with medical history significant for metastatic ovarian cancer, insulin-dependent diabetes mellitus, hypertension, GERD, and DVT on therapeutic Lovenox, presenting from her nursing facility for evaluation of abnormal blood work and twitching movements  Clinical Impression  Pt admitted with above diagnosis. Pt currently with functional limitations due to the deficits listed below (see PT Problem List).   Pt will benefit from skilled PT to increase their independence and safety with mobility to allow discharge to the venue listed below.   Recommend SNF post acute, will follow in acute sitting;     Follow Up Recommendations SNF    Equipment Recommendations  None recommended by PT    Recommendations for Other Services       Precautions / Restrictions Restrictions Weight Bearing Restrictions: No      Mobility  Bed Mobility Overal bed mobility: Needs Assistance Bed Mobility: Rolling Rolling: Min assist Sidelying to sit: Min assist       General bed mobility comments: uses rail to roll right and left, requires multi-modal cues and encouragement to incr pt effort  Transfers                    Ambulation/Gait                Stairs            Wheelchair Mobility    Modified Rankin (Stroke Patients Only)       Balance Overall balance assessment:  (pt refused sitting today)                                           Pertinent Vitals/Pain Pain Assessment: Faces Faces Pain Scale: Hurts a little bit Pain Location: LEs painful to light touch Pain Descriptors / Indicators: Sore Pain Intervention(s): Limited activity within patient's tolerance;Monitored during session    Home Living Family/patient expects to be discharged to:: Skilled nursing  facility                      Prior Function                 Hand Dominance        Extremity/Trunk Assessment   Upper Extremity Assessment Upper Extremity Assessment: Generalized weakness    Lower Extremity Assessment Lower Extremity Assessment: Generalized weakness       Communication      Cognition Arousal/Alertness: Lethargic Behavior During Therapy: WFL for tasks assessed/performed;Flat affect Overall Cognitive Status: Within Functional Limits for tasks assessed                                 General Comments: requires encouragement, keeps eyes closed but is verbally responsive to questions and commands      General Comments      Exercises     Assessment/Plan    PT Assessment Patient needs continued PT services  PT Problem List Decreased strength;Decreased mobility;Decreased activity tolerance;Decreased balance;Decreased knowledge of use of DME;Pain;Decreased safety awareness       PT Treatment Interventions DME instruction;Gait training;Functional mobility training;Balance training;Therapeutic exercise;Therapeutic activities;Patient/family education    PT Goals (Current goals can be found in  the Care Plan section)  Acute Rehab PT Goals Patient Stated Goal: to walk, go home PT Goal Formulation: With patient Time For Goal Achievement: 2017/01/20 Potential to Achieve Goals: Fair    Frequency Min 2X/week   Barriers to discharge Decreased caregiver support      Co-evaluation               AM-PAC PT "6 Clicks" Daily Activity  Outcome Measure Difficulty turning over in bed (including adjusting bedclothes, sheets and blankets)?: Total Difficulty moving from lying on back to sitting on the side of the bed? : Total Difficulty sitting down on and standing up from a chair with arms (e.g., wheelchair, bedside commode, etc,.)?: Total Help needed moving to and from a bed to chair (including a wheelchair)?: Total Help needed  walking in hospital room?: Total Help needed climbing 3-5 steps with a railing? : Total 6 Click Score: 6    End of Session   Activity Tolerance: Patient tolerated treatment well;Other (comment) (self limiting, fatigued and decr motivation) Patient left: in bed;with call bell/phone within reach;with bed alarm set   PT Visit Diagnosis: Muscle weakness (generalized) (M62.81)    Time: 8022-3361 PT Time Calculation (min) (ACUTE ONLY): 9 min   Charges:   PT Evaluation $PT Eval Moderate Complexity: 1 Mod     PT G CodesKenyon Ana, PT Pager: [phone] 12/20/2016   [facility] 12/20/2016, 11:17 AM

## 2016-12-20 NOTE — Care Management Note (Signed)
Case Management Note  Patient Details  Name: Annette Farmer MRN: [id number] Date of Birth: [birth year]  Subjective/Objective:   sepsis                 Action/Plan: Date:  December 20, 2016 Chart reviewed for concurrent status and case management needs. Will continue to follow patient progress. Discharge Planning: following for needs Expected discharge date: [id number] Velva Harman, BSN, [place], [place]  Expected Discharge Date:   (unknown)               Expected Discharge Plan:  [place]  In-House Referral:  Clinical Social Work  Discharge planning Services  CM Consult  Post Acute Care Choice:    Choice offered to:     DME Arranged:    DME Agency:     HH Arranged:    [place] Agency:     Status of Service:  In process, will continue to follow  If discussed at Long Length of Stay Meetings, dates discussed:    Additional Comments:  Leeroy Cha, RN 12/20/2016, 8:50 AM

## 2016-12-20 NOTE — Progress Notes (Signed)
CSW consulted to assist with SNF placement. Pt admitted from [facility] on 12/19/16 with focal seizures. Pt had recently been hospitalized at [facility] from 12/02/16-12/07/16 and was dc back to [facility]. Please see psychosocial assessment dated 12/05/16. CSW attempted to meet with pt, to assist with dc planning, but pt was sleeping. CSW will meet with pt on 8/1. PT has recommended SNF at dc. Pt has [employer] which requires authorization for SNF placement. CSW will assist with this process once dc plan has been confirmed. CSW will continue to follow to assist with dc planning.  Werner Lean LCSW [phone]

## 2016-12-20 NOTE — Progress Notes (Signed)
Patient and family reports that Annette Farmer likes to sleep in the day time and will  stay awake all night. Per report from friends that she usually goes to bed around 0330 am and sleeps until 1500-1600 every day.

## 2016-12-20 NOTE — Progress Notes (Signed)
  Echocardiogram 2D Echocardiogram has been performed.  Extremely difficult exam due to patient yelling during entire exam "go away" and "I don't like you, you are hurting me." I informed patient I would discontinue exam. Unable to obtain images during apical windows due to patient screaming.  Parminder Cupples L Androw 12/20/2016, 11:36 AM

## 2016-12-20 NOTE — Progress Notes (Signed)
Pharmacy Antibiotic Note  Annette Farmer is a 71 y.o. female admitted on 12/19/2016 with sepsis.  Pharmacy initially consulted for Vancomycin & Zosyn dosing. Now asked to change Zosyn to Cefepime as MD concerned that Zosyn can lower seizure threshold  Plan:  DC Zosyn  Cefepime 1g IV q8h  Continue Vancomycin 500mg  IV q12h (consider stopping since MRSA PCR is negative)  Follow up renal function & cultures, de-escalate as appropriate  Height: 5' 1.5" (156.2 cm) Weight: 151 lb 14.4 oz (68.9 kg) IBW/kg (Calculated) : 48.95  Temp (24hrs), Avg:97.8 F (36.6 C), Min:97.5 F (36.4 C), Max:98.4 F (36.9 C)   Recent Labs Lab 12/13/16 0900 12/19/16 1228 12/19/16 1646 12/19/16 2350 12/20/16 0525  WBC 2.6* 15.0*  --   --  13.5*  CREATININE 0.9 0.89  --   --  0.79  LATICACIDVEN  --   --  0.9 0.9  --     Estimated Creatinine Clearance: 57.2 mL/min (by C-G formula based on SCr of 0.79 mg/dL).    Allergies  Allergen Reactions  . Penicillins Nausea Only    Has patient had a PCN reaction causing immediate rash, facial/tongue/throat swelling, SOB or lightheadedness with hypotension: NO Has patient had a PCN reaction causing severe rash involving mucus membranes or skin necrosis: NO Has patient had a PCN reaction that required hospitalization: NO Has patient had a PCN reaction occurring within the last 10 years: NO If all of the above answers are "NO", then may proceed with Cephalosporin use.    Antimicrobials this admission: 7/30 Zosyn >> 7/31 7/30 Vanc  >>  7/31 Cefepime >>  Dose adjustments this admission:  Microbiology results: 7/30 BCx: collected 7/30 UCx: sent 7/30 MRSA PCR: neg  Thank you for allowing pharmacy to be a part of this patient's care.  Peggyann Juba, PharmD, BCPS Pager: [phone] 12/20/2016, 8:53 AM

## 2016-12-20 NOTE — Progress Notes (Signed)
PROGRESS NOTE    JURY CASERTA  KVQ:[id number] DOB: 02-Nov-1945 DOA: 12/19/2016 PCP: Wardell Honour, MD   Brief Narrative:   Annette Farmer is a 71 y.o. female with medical history significant for metastatic ovarian cancer, insulin-dependent diabetes mellitus, hypertension, GERD, and DVT on therapeutic Lovenox, presenting from her nursing facility for evaluation of abnormal blood work and twitching movements.Neurology was consulted by the ED physician, suspects a focal seizure, and recommends medical admission with MRI and Keppra. Patient was loaded with Keppra in the ED. She is admitted to the stepdown unit for ongoing evaluation and management of suspected focal seizures and SIRS with hypotension.  Patient is now admitted to the hospitalist medicine service with focal seizures, SIRS, no clear source, in the setting of metastatic ovarian cancer.   7/31: MRI Brain with early to sub acute L parietal and occipital ischemic activity.    Assessment & Plan:   Principal Problem:   Focal seizures ([place]) Active Problems:   Type 2 diabetes mellitus (HCC)   HYPERTENSION, BENIGN SYSTEMIC   Hemiparesis affecting right side as late effect of cerebrovascular accident ([place])   Refusal of statin medication by patient   DVT (deep venous thrombosis) (HCC)   Carcinomatosis (HCC)   Severe protein-calorie malnutrition (HCC)   Failure to thrive in adult   Hypokalemia   SIRS (systemic inflammatory response syndrome) (HCC)   Normocytic anemia   Hypotension   Pressure injury of skin   Focal seizure ([place])    1. Focal Seizures, now with early to sub acute ischemic stroke:  On [place]  Neurology following, appreciate input.   Stroke order set initiated  ECHO, MRI brain, MRA ordered.  Now NPO.  2. SIRS, unclear infectious course, concern for sepsis:  Continue to monitor culture results, CXR not entirely definitive for PNA.  Continue broad spectrum antibiotics Vanc, will d/c Zosyn as patient is  suspected to have focal seizures, and change Abx coverage to cefepime.   3.Hypokalemia: Resolved, K at 3.5, continue to monitor, may need additional K runs.   4. Abdominal distension, ? Ascites:  Stroke work up today, monitor for paracentesis, at least for comfort purposes later.    DVT prophylaxis: SQ lovenox.  Code Status: DNR Family Communication: call placed, unable to reach next of kin God sister Harless Nakayama at [phone] Disposition Plan: 1. Pending stroke work up and neurologist input.  2. Pending palliative input, may need hospice at d/c.   Consultants:   Neurology  Palliative   Procedures:  Stroke work up has been initiated: MRI brain with contrast, MRA brain, ECHO.  Antimicrobials:  Empiric Vanc and Zosyn. Cultures and additional infectious work up pending. D/C Zosyn and being Cefepime as Zosyn may lower seizure threshold.   Subjective:  resting in bed, she mutters "hmm" when name is called Is not awake, does not follow commands   Objective: Vitals:   12/20/16 0332 12/20/16 0338 12/20/16 0400 12/20/16 0500  BP: (!) 110/54  (!) 87/53 106/66  Pulse: 89   84  Resp: 13  (!) 9 (!) 21  Temp:  (!) 97.5 F (36.4 C)    TempSrc:  Axillary    SpO2: 97%   94%  Weight:    68.9 kg (151 lb 14.4 oz)  Height:        Intake/Output Summary (Last 24 hours) at 12/20/16 0825 Last data filed at 12/19/16 2159  Gross per 24 hour  Intake  50 ml  Output                1 ml  Net               49 ml   Filed Weights   12/19/16 1700 12/20/16 0500  Weight: 66.6 kg (146 lb 13.2 oz) 68.9 kg (151 lb 14.4 oz)    Examination:  General exam: Appears calm and comfortable  Respiratory system: Clear to auscultation. Respiratory effort normal. Cardiovascular system: S1 & S2 heard, RRR. No JVD, murmurs, rubs, gallops or clicks. No pedal edema. Gastrointestinal system: Abdomen  mildly distended, soft and nontender. No organomegaly or masses felt. Normal bowel sounds  heard. Central nervous system: Alert and oriented. No focal neurological deficits. Extremities: Symmetric 5 x 5 power. Skin: No rashes, lesions or ulcers Psychiatry:  Unable to assess Patient is non verbal Not much awake or alert Maintaining airway     Data Reviewed: I have personally reviewed following labs and imaging studies  CBC:  Recent Labs Lab 12/13/16 0900 12/19/16 1228 12/20/16 0525  WBC 2.6* 15.0* 13.5*  NEUTROABS 2.3 12.2* 11.5*  HGB 8.7* 8.4* 8.0*  HCT 26.4* 26.2* 24.7*  MCV 90.7 92.9 95.0  PLT 225 152 800*   Basic Metabolic Panel:  Recent Labs Lab 12/13/16 0900 12/19/16 1228 12/20/16 0525  NA 133* 134* 138  K 4.2 2.8* 3.5  CL  --  97* 104  CO2 22 29 26   GLUCOSE 190* 97 97  BUN 16.7 8 8   CREATININE 0.9 0.89 0.79  CALCIUM 8.5 8.0* 7.7*   GFR: Estimated Creatinine Clearance: 57.2 mL/min (by C-G formula based on SCr of 0.79 mg/dL). Liver Function Tests:  Recent Labs Lab 12/13/16 0900 12/19/16 1228 12/20/16 0525  AST 70* 30 27  ALT 53 28 23  ALKPHOS 140 119 114  BILITOT 0.80 0.6 0.6  PROT 6.0* 6.0* 4.9*  ALBUMIN 2.8* 2.9* 2.4*   No results for input(s): LIPASE, AMYLASE in the last 168 hours.  Recent Labs Lab 12/19/16 1653  AMMONIA 20   Coagulation Profile:  Recent Labs Lab 12/19/16 1646  INR 1.13   Cardiac Enzymes: No results for input(s): CKTOTAL, CKMB, CKMBINDEX, TROPONINI in the last 168 hours. BNP (last 3 results) No results for input(s): PROBNP in the last 8760 hours. HbA1C: No results for input(s): HGBA1C in the last 72 hours. CBG:  Recent Labs Lab 12/19/16 1933 12/19/16 2327 12/20/16 0330 12/20/16 0813  GLUCAP 73 86 90 90   Lipid Profile: No results for input(s): CHOL, HDL, LDLCALC, TRIG, CHOLHDL, LDLDIRECT in the last 72 hours. Thyroid Function Tests: No results for input(s): TSH, T4TOTAL, FREET4, T3FREE, THYROIDAB in the last 72 hours. Anemia Panel: No results for input(s): VITAMINB12, FOLATE, FERRITIN,  TIBC, IRON, RETICCTPCT in the last 72 hours. Sepsis Labs:  Recent Labs Lab 12/19/16 1646 12/19/16 2350  LATICACIDVEN 0.9 0.9    Recent Results (from the past 240 hour(s))  MRSA PCR Screening     Status: None   Collection Time: 12/19/16  5:40 PM  Result Value Ref Range Status   MRSA by PCR NEGATIVE NEGATIVE Final    Comment:        The GeneXpert MRSA Assay (FDA approved for NASAL specimens only), is one component of a comprehensive MRSA colonization surveillance program. It is not intended to diagnose MRSA infection nor to guide or monitor treatment for MRSA infections.          Radiology Studies: Ct Head Wo Contrast  Result Date: 12/19/2016 CLINICAL DATA:  Left arm twitching today. EXAM: CT HEAD WITHOUT CONTRAST TECHNIQUE: Contiguous axial images were obtained from the base of the skull through the vertex without intravenous contrast. COMPARISON:  Head CT scan 01/09/2011.  Brain MRI 01/10/2011. FINDINGS: Brain: There is chronic microvascular ischemic change and mild atrophy. No evidence of acute abnormality including hemorrhage, infarct, mass lesion, mass effect, midline shift or abnormal extra-axial fluid collection. No hydrocephalus or pneumocephalus. Vascular: Atherosclerosis noted. Skull: Intact. Sinuses/Orbits: Negative. Other: None. IMPRESSION: No acute abnormality. Atrophy and chronic microvascular ischemic change. Atherosclerosis. Electronically Signed   By: Inge Rise M.D.   On: 12/19/2016 13:47   Mr Brain Wo Contrast  Result Date: 12/19/2016 CLINICAL DATA:  Dizziness EXAM: MRI HEAD WITHOUT CONTRAST TECHNIQUE: Multiplanar, multiecho pulse sequences of the brain and surrounding structures were obtained without intravenous contrast. COMPARISON:  Brain MRI 01/10/2011 FINDINGS: The examination had to be discontinued prior to completion due to patient refusal to continue. Multiple sequences are markedly motion degraded. Brain: The midline structures are normal. There  are 3 small foci of diffusion restriction within the posterior left parietal lobe and left occipital lobe. There is multifocal hyperintense T2-weighted signal within the periventricular white matter, most often seen in the setting of chronic microvascular ischemia. No intraparenchymal hematoma or chronic microhemorrhage. Brain volume is normal for age without age-advanced or lobar predominant atrophy. The dura is normal and there is no extra-axial collection. Vascular: Major intracranial arterial and venous sinus flow voids are preserved. Skull and upper cervical spine: The visualized skull base, calvarium, upper cervical spine and extracranial soft tissues are normal. Sinuses/Orbits: No fluid levels or advanced mucosal thickening. No mastoid or middle ear effusion. Normal orbits. IMPRESSION: 1. Truncated, motion degraded examination. 2. 3 subcentimeter foci of acute to early subacute ischemia within the posterior left parietal and left occipital lobes. No hemorrhage or mass effect. Electronically Signed   By: Ulyses Jarred M.D.   On: 12/19/2016 21:17   Dg Chest Port 1 View  Result Date: 12/19/2016 CLINICAL DATA:  Shortness of breath, weakness and anemia. History of ovarian carcinoma with peritoneal carcinomatosis and ascites. EXAM: PORTABLE CHEST 1 VIEW COMPARISON:  11/10/2016 FINDINGS: The heart size is normal. There is stable tortuosity of the thoracic aorta. Port-A-Cath present with catheter tip in the SVC. There is some elevation of the right hemidiaphragm with some associated atelectasis at the right lung base. Based on appearance, a component of subpulmonic pleural fluid and/or ascites causing elevation of the right hemidiaphragm may be present. There is known peritoneal carcinomatosis and ascites by prior imaging. IMPRESSION: Elevated right hemidiaphragm. Based on chest x-ray appearance, a component of subpulmonic pleural fluid and/or ascites under the right hemidiaphragm may be present. Electronically  Signed   By: Aletta Edouard M.D.   On: 12/19/2016 18:31        Scheduled Meds: .  stroke: mapping our early stages of recovery book   Does not apply Once  . bisacodyl  10 mg Rectal Daily  . Chlorhexidine Gluconate Cloth  6 each Topical Daily  . enoxaparin  85 mg Subcutaneous Q12H  . famotidine  20 mg Oral Daily  . feeding supplement  237 mL Oral TID WC  . gabapentin  300 mg Oral QHS  . insulin aspart  0-9 Units Subcutaneous Q4H  . levETIRAcetam  750 mg Oral BID  . metoCLOPramide  5 mg Oral TID AC  . mirtazapine  15 mg Oral QHS  . pantoprazole  40 mg Oral  [id number]  . polyethylene glycol  17 g Oral Daily  . potassium chloride SA  20 mEq Oral Daily  . senna-docusate  2 tablet Oral BID  . sodium chloride flush  3 mL Intravenous Q12H   Continuous Infusions: . piperacillin-tazobactam (ZOSYN)  IV 3.375 g (12/20/16 0517)  . sodium chloride    . vancomycin Stopped (12/20/16 0733)     LOS: 1 day    Time spent: 35 min     Loistine Chance, MD [facility] Pager [phone]   If 7PM-7AM, please contact night-coverage [email or web address] Password [email or web address] 12/20/2016, 8:25 AM

## 2016-12-20 NOTE — Procedures (Signed)
ELECTROENCEPHALOGRAM REPORT  Date of Study: 12/20/2016  Patient's Name: Annette Farmer MRN: [id number] Date of Birth: [birth year]  Referring Provider: Dr. Christia Reading [place]  Clinical History: This is a 71 year old woman with left arm and face twitching.  Medications: levETIRAcetam (KEPPRA) tablet 750 mg  gabapentin (NEURONTIN) capsule 300 mg  acetaminophen (TYLENOL) tablet 650 mg  albuterol (PROVENTIL) (2.5 MG/3ML) 0.083% nebulizer solution 2.5 mg  alum & mag hydroxide-simeth (MAALOX/MYLANTA) 200-200-20 MG/5ML suspension 30 mL  aspirin suppository 300 mg  bisacodyl (DULCOLAX) suppository 10 mg  ceFEPIme (MAXIPIME) 1 g in dextrose 5 % 50 mL IVPB  Chlorhexidine Gluconate Cloth 2 % PADS 6 each  enoxaparin (LOVENOX) injection 85 mg  famotidine (PEPCID) tablet 20 mg  feeding supplement (BOOST / RESOURCE BREEZE) liquid 1 Container  gadobenate dimeglumine (MULTIHANCE) injection 15 mL  guaiFENesin-dextromethorphan (ROBITUSSIN DM) 100-10 MG/5ML syrup 5 mL  HYDROcodone-acetaminophen (NORCO/VICODIN) 5-325 MG per tablet 1-2 tablet  insulin aspart (novoLOG) injection 0-9 Units  LORazepam (ATIVAN) injection 0.5-1 mg  metoCLOPramide (REGLAN) tablet 5 mg  mirtazapine (REMERON) tablet 15 mg  pantoprazole (PROTONIX) EC tablet 40 mg  potassium chloride SA (K-DUR,KLOR-CON) CR tablet 20 mEq  senna-docusate (Senokot-S) tablet 2 tablet  traMADol (ULTRAM) tablet 50 mg  vancomycin (VANCOCIN) 500 mg in sodium chloride 0.9 % 100 mL IVPB   Technical Summary: A multichannel digital EEG recording measured by the international 10-20 system with electrodes applied with paste and impedances below 5000 ohms performed in our laboratory with EKG monitoring in a predominantly drowsy and asleep patient.  Hyperventilation and photic stimulation were not performed.  The digital EEG was referentially recorded, reformatted, and digitally filtered in a variety of bipolar and referential montages for optimal display.     Description: The patient is predominantly drowsy and asleep during the recording.  During brief period of maximal wakefulness, there is a symmetric, medium voltage 7 Hz posterior dominant rhythm that attenuates with eye opening.  The record is symmetric.  During drowsiness and sleep, there is an increase in theta and delta slowing of the background, at times with higher amplitude over the left temporal region. There is occasional sharply contoured slowing over the bilateral temporal regions without clear epileptogenic potential. Poorly formed vertex waves and sleep spindles were seen.  Hyperventilation and photic stimulation were not performed.  There were no epileptiform discharges or electrographic seizures seen.    EKG lead showed occasional irregular rhythm.  Impression: This predominantly drowsy and asleep EEG is mildly abnormal due to mild diffuse background slowing with slowing of the posterior dominant rhythm.  Clinical Correlation of the above findings indicates diffuse cerebral dysfunction that is non-specific in etiology, and may be seen with hypoxic/ischemic injury, toxic/metabolic encephalopathy, medication effect, or due to excessive drowsiness. The absence of epileptiform discharges does not exclude a clinical diagnosis of epilepsy. Clinical correlation is advised.   Ellouise Newer, M.D.

## 2016-12-20 NOTE — Progress Notes (Signed)
EEG Completed; Results Pending  

## 2016-12-20 NOTE — Evaluation (Addendum)
SLP Cancellation Note  Patient Details Name: Annette Farmer MRN: [id number] DOB: 27-Oct-1945   Cancelled treatment:       Reason Eval/Treat Not Completed: Fatigue/lethargy limiting ability to participate  Pt also getting ready to have ECHO.    [name] [place], [place] [facility] SLP [phone]

## 2016-12-21 DIAGNOSIS — R569 Unspecified convulsions: Secondary | ICD-10-CM

## 2016-12-21 DIAGNOSIS — Z7189 Other specified counseling: Secondary | ICD-10-CM

## 2016-12-21 DIAGNOSIS — C800 Disseminated malignant neoplasm, unspecified: Secondary | ICD-10-CM

## 2016-12-21 LAB — BASIC METABOLIC PANEL
ANION GAP: 8 (ref 5–15)
BUN: 9 mg/dL (ref 6–20)
CHLORIDE: 108 mmol/L (ref 101–111)
CO2: 23 mmol/L (ref 22–32)
Calcium: 8 mg/dL — ABNORMAL LOW (ref 8.9–10.3)
Creatinine, Ser: 0.98 mg/dL (ref 0.44–1.00)
GFR calc Af Amer: >60 mL/min (ref >60)
GFR, EST NON AFRICAN AMERICAN: 56 mL/min — AB (ref >60)
GLUCOSE: 82 mg/dL (ref 65–99)
POTASSIUM: 3.4 mmol/L — AB (ref 3.5–5.1)
Sodium: 139 mmol/L (ref 135–145)

## 2016-12-21 LAB — GLUCOSE, CAPILLARY
GLUCOSE-CAPILLARY: 87 mg/dL (ref 65–99)
Glucose-Capillary: 73 mg/dL (ref 65–99)
Glucose-Capillary: 90 mg/dL (ref 65–99)

## 2016-12-21 LAB — CBC
HEMATOCRIT: 24.6 % — AB (ref 36.0–46.0)
Hemoglobin: 8.1 g/dL — ABNORMAL LOW (ref 12.0–15.0)
MCH: 31 pg (ref 26.0–34.0)
MCHC: 32.9 g/dL (ref 30.0–36.0)
MCV: 94.3 fL (ref 78.0–100.0)
Platelets: 130 10*3/uL — ABNORMAL LOW (ref 150–400)
RBC: 2.61 MIL/uL — AB (ref 3.87–5.11)
RDW: 19.8 % — ABNORMAL HIGH (ref 11.5–15.5)
WBC: 11.6 10*3/uL — ABNORMAL HIGH (ref 4.0–10.5)

## 2016-12-21 LAB — LIPID PANEL
CHOL/HDL RATIO: 5.8 ratio
CHOLESTEROL: 116 mg/dL (ref 0–200)
HDL: 20 mg/dL — ABNORMAL LOW (ref >40)
LDL Cholesterol: 67 mg/dL (ref 0–99)
Triglycerides: 147 mg/dL (ref <150)
VLDL: 29 mg/dL (ref 0–40)

## 2016-12-21 MED ORDER — GLYCOPYRROLATE 1 MG PO TABS
1.0000 mg | ORAL_TABLET | ORAL | Status: DC | PRN
  Filled 2016-12-21: qty 1

## 2016-12-21 MED ORDER — POLYVINYL ALCOHOL 1.4 % OP SOLN
1.0000 [drp] | Freq: Four times a day (QID) | OPHTHALMIC | Status: DC | PRN
  Filled 2016-12-21: qty 15

## 2016-12-21 MED ORDER — GLYCOPYRROLATE 0.2 MG/ML IJ SOLN
0.2000 mg | INTRAMUSCULAR | Status: DC | PRN
  Filled 2016-12-21: qty 1

## 2016-12-21 MED ORDER — LORAZEPAM 2 MG/ML IJ SOLN
1.0000 mg | INTRAMUSCULAR | Status: DC | PRN
  Administered 2016-12-21: 1 mg via INTRAVENOUS

## 2016-12-21 MED ORDER — HALOPERIDOL 0.5 MG PO TABS
0.5000 mg | ORAL_TABLET | ORAL | Status: DC | PRN
  Filled 2016-12-21: qty 1

## 2016-12-21 MED ORDER — LORAZEPAM 2 MG/ML PO CONC
0.5000 mg | Freq: Two times a day (BID) | ORAL | Status: DC
  Administered 2016-12-21: 0.5 mg via ORAL
  Filled 2016-12-21: qty 1

## 2016-12-21 MED ORDER — LORAZEPAM 2 MG/ML PO CONC: 1.0000 mg | ORAL | Status: DC | PRN

## 2016-12-21 MED ORDER — POTASSIUM CHLORIDE 10 MEQ/100ML IV SOLN
10.0000 meq | INTRAVENOUS | Status: AC
  Administered 2016-12-21 (×2): 10 meq via INTRAVENOUS
  Filled 2016-12-21 (×2): qty 100

## 2016-12-21 MED ORDER — BIOTENE DRY MOUTH MT LIQD: 15.0000 mL | OROMUCOSAL | Status: DC | PRN

## 2016-12-21 MED ORDER — MORPHINE SULFATE (CONCENTRATE) 10 MG/0.5ML PO SOLN: 5.0000 mg | ORAL | Status: DC | PRN

## 2016-12-21 MED ORDER — LORAZEPAM 1 MG PO TABS: 1.0000 mg | ORAL_TABLET | ORAL | Status: DC | PRN

## 2016-12-21 MED ORDER — SODIUM CHLORIDE 0.9 % IV SOLN: INTRAVENOUS | Status: DC

## 2016-12-21 MED ORDER — ENOXAPARIN SODIUM 80 MG/0.8ML ~~LOC~~ SOLN
1.0000 mg/kg | Freq: Two times a day (BID) | SUBCUTANEOUS | Status: DC
  Administered 2016-12-21: 10:00:00 70 mg via SUBCUTANEOUS
  Filled 2016-12-21: qty 0.7

## 2016-12-21 MED ORDER — HALOPERIDOL LACTATE 5 MG/ML IJ SOLN: 0.5000 mg | INTRAMUSCULAR | Status: DC | PRN

## 2016-12-21 MED ORDER — HALOPERIDOL LACTATE 2 MG/ML PO CONC
0.5000 mg | ORAL | Status: DC | PRN
  Filled 2016-12-21: qty 0.3

## 2016-12-21 NOTE — Consult Note (Signed)
[facility] [employer]  Received request from [place] for family interest in [place] with instruction to contact Christeen Douglas at [phone]. Chart reviewed and spoke with Mikki Santee by phone. He is agreeable for patient to transfer to [facility] tomorrow. He lives in [place] and is not available to complete paper work in person. Plan to complete paper work with him by fax tomorrow morning. Dr. Orpah Melter to assume care per his request. Sent message to Roselyn Reef and Elmyra Ricks making them aware room is available 12/22/16.  Please fax discharge summary to [phone].  RN Please call report to [phone].  Thank you,  Erling Conte, LCSW [phone]

## 2016-12-21 NOTE — Progress Notes (Signed)
Daily Progress Note   Patient Name: Annette Farmer       Date: 12/21/2016 DOB: [birth year]  Age: 71 y.o. MRN#: [id number] Attending Physician: Elmarie Shiley, MD Primary Care Physician: Wardell Honour, MD Admit Date: 12/19/2016  Reason for Consultation/Follow-up: Establishing goals of care, Hospice Evaluation, Non pain symptom management, Pain control and Psychosocial/spiritual support  Subjective: Spoke with HCPOA, Brother and friend privately.  Explained anemia, seizures, left occipital/parietal lobe stroke on top of metastatic ovarian cancer with carcinomatosis.   Recent OT notes indicate a cognitive deficit.   Oncology notes - no further chemo or aggressive treatment.  A MOST form was completed.  The following selections were made:  1. DNR 2.  Comfort Measures Only.  Do not return to the hospital unless she can not be made comfortable in her current location 3.  Antibiotics.  For comfort.  Determine at time of infection 4.  No IVF 5.  No Feeding Tube  [name] is introverted and wants nothing more than to be left alone.  We discussed [facility] as a place to go for a very comfortable peaceful passing.  Initially [name] said her friends would not let her go there even though it is what she wants to do.  Her friend at bedside stated she would support [name] in going to [employer].   [name] was also happy to hear that there may be cats at [facility].  Assessment: Rapid decline in a patient with metastatic ovarian cancer with carcinomatosis - causing recent bowel obstruction.  She is unable and unwilling to get up.  Unintentional wt loss. Acute anemia from unknown source, seizures, CVA.   Patient Profile/HPI:  71 y.o. female  with past medical history of stroke, metastatic ovarian cancer  with carcinomatosis, partial bowel obs (thought to be related to metastatic cancer), DM, depression who was admitted on 12/19/2016 with focal seizures, anemia, and CVA.  Per EPIC notes she was sent to the ER from [facility] for abnormal labs.  It appears her hgb has dropped from 11-12 in early July 2018 to 8-9 range.  In transport the patient was noted to have left sided facial and upper extremity twitching.  MR brain at the time of admission showed a possible early infarct in the left parietal and left occipital lobes.  Length  of Stay: 2  Current Medications: Scheduled Meds:  . Chlorhexidine Gluconate Cloth  6 each Topical Daily  . famotidine  20 mg Oral Daily  . gabapentin  300 mg Oral QHS  . levETIRAcetam  750 mg Oral BID  . LORazepam  0.5 mg Oral BID  . metoCLOPramide  5 mg Oral TID AC  . mirtazapine  15 mg Oral QHS  . senna-docusate  2 tablet Oral BID  . sodium chloride flush  3 mL Intravenous Q12H    Continuous Infusions: . sodium chloride 100 mL/hr at 12/21/16 1145  . sodium chloride      PRN Meds: acetaminophen **OR** acetaminophen, albuterol, alum & mag hydroxide-simeth, antiseptic oral rinse, glycopyrrolate **OR** glycopyrrolate **OR** glycopyrrolate, guaiFENesin-dextromethorphan, haloperidol **OR** haloperidol **OR** haloperidol lactate, HYDROcodone-acetaminophen, LORazepam **OR** LORazepam **OR** LORazepam, LORazepam, morphine CONCENTRATE **OR** morphine CONCENTRATE, ondansetron **OR** ondansetron (ZOFRAN) IV, polyvinyl alcohol  Physical Exam       Pale ill appearing female.  Sleepy Cooperative but usually in somewhat of an ill mood.  Wants the room dark.  Wants to be left alone. CV rrr Resp no distress Abdomen distended but soft  Vital Signs: BP 120/70   Pulse 96   Temp 98.2 F (36.8 C) (Oral)   Resp 13   Ht 5' 1.5" (1.562 m)   Wt 70.7 kg (155 lb 13.8 oz)   SpO2 99%   BMI 28.97 kg/m  SpO2: SpO2: 99 % O2 Device: O2 Device: Not Delivered O2 Flow Rate:     Intake/output summary:  Intake/Output Summary (Last 24 hours) at 12/21/16 1426 Last data filed at 12/21/16 1000  Gross per 24 hour  Intake           2287.5 ml  Output              700 ml  Net           1587.5 ml   LBM: Last BM Date: 12/21/16 Baseline Weight: Weight: 66.6 kg (146 lb 13.2 oz) Most recent weight: Weight: 70.7 kg (155 lb 13.8 oz)       Palliative Assessment/Data:    Flowsheet Rows     Most Recent Value  Intake Tab  Referral Department  Hospitalist  Unit at Time of Referral  Intermediate Care Unit  Palliative Care Primary Diagnosis  Cancer  Date Notified  12/20/16  Palliative Care Type  Return patient Palliative Care  Reason for referral  Clarify Goals of Care  Date of Admission  12/19/16  Date first seen by Palliative Care  12/20/16  # of days Palliative referral response time  0 Day(s)  # of days IP prior to Palliative referral  1  Clinical Assessment  Palliative Performance Scale Score  30%  Psychosocial & Spiritual Assessment  Palliative Care Outcomes  Patient/Family meeting held?  Yes  Who was at the meeting?  patient  Palliative Care Outcomes  Clarified goals of care      Patient Active Problem List   Diagnosis Date Noted  . Hypokalemia 12/19/2016  . SIRS (systemic inflammatory response syndrome) ([place]) 12/19/2016  . Focal seizures ([place]) 12/19/2016  . Normocytic anemia 12/19/2016  . Hypotension 12/19/2016  . Pressure injury of skin 12/19/2016  . Focal seizure ([place])   . Failure to thrive in adult 12/13/2016  . Peritonitis ([place]) 12/02/2016  . Peritoneal carcinomatosis ([place])   . Ovarian cancer on right [facility]): Stage 3C 11/16/2016  . Ileus ([place]) 11/16/2016  . Acute thromboembolism of deep veins of right lower extremity (HCC)   .  Port-a-cath in place   . DNR (do not resuscitate)   . Metastatic cancer ([place])   . Palliative care encounter   . Goals of care, counseling/discussion   . Bowel obstruction ([place])   . Severe protein-calorie  malnutrition ([place])   . AKI (acute kidney injury) ([place]) 11/11/2016  . Pelvic mass 11/11/2016  . Carcinomatosis ([place]) 11/11/2016  . DVT (deep venous thrombosis) ([place]) 11/10/2016  . Refusal of statin medication by patient 09/28/2016  . Hemiparesis affecting right side as late effect of cerebrovascular accident ([place]) 04/23/2012  . Type 2 diabetes mellitus ([place]) 07/20/2006  . HYPERCHOLESTEROLEMIA 07/20/2006  . OBESITY, NOS 07/20/2006  . HYPERTENSION, BENIGN SYSTEMIC 07/20/2006  . HEMORRHOIDS, NOS 07/20/2006  . CONSTIPATION 07/20/2006  . INCONTINENCE, STRESS, FEMALE 07/20/2006  . MENOPAUSAL SYNDROME 07/20/2006    Palliative Care Plan    Recommendations/Plan:  Full comfort care  Social work consult placed for Hospice House placement  Have discontinued all interventions not focused on comfort.   Have added comfort medications   Goals of Care and Additional Recommendations:  Limitations on Scope of Treatment: Full Comfort Care  Code Status:  DNR  Prognosis:  Days to weeks in the setting of rapid functional decline in a patient with metastatic ovarian cancer with carcinomatosis, acute anemia from uncertain source, acute seizures, acute stroke.  Patient only wants comfort measures.  HCPOA agreeable.  Discharge Planning:  Hospice facility  Care plan was discussed with RN, left message for social work.  Patient agreeable to go to "end of life care facility" but does not like the term Hospice.  Thank you for allowing the Palliative Medicine Team to assist in the care of this patient.  Total time spent:  70 min.     Greater than 50%  of this time was spent counseling and coordinating care related to the above assessment and plan.  Florentina Jenny, PA-C Palliative Medicine  Please contact Palliative MedicineTeam phone at [phone] for questions and concerns between 7 am - 7 pm.   Please see AMION for individual provider pager numbers.

## 2016-12-21 NOTE — Progress Notes (Signed)
Transferred to room 1540 via bed. Report given to RN.

## 2016-12-21 NOTE — Progress Notes (Addendum)
CSW left voicemail for Annette Farmer to discuss patient D/C, waiting for return call.  CSW spoke with Annette Farmer, he is interterested in [employer]. CSW informed liaison Harmon Pier.  She will follow up with CSW and family if bed is available.   Kathrin Greathouse, Latanya Presser, MSW Clinical Social Worker 5E and Psychiatric Service Line [phone] 12/21/2016  3:42 PM

## 2016-12-21 NOTE — Progress Notes (Signed)
Subjective: Very drowsy and just received Morphine. Talked to daughter who stated likely she will be going to hospice. Family agreed on comfort care.   Exam: Vitals:   12/21/16 0800 12/21/16 1000  BP: (!) 97/59 120/70  Pulse: 83 96  Resp: 14 13  Temp: 98.2 F (36.8 C)     HEENT-  Normocephalic, no lesions, without obvious abnormality.  Normal external eye and conjunctiva.  Normal TM's bilaterally.  Normal auditory canals and external ears. Normal external nose, mucus membranes and septum.  Normal pharynx.    Neuro:  CN: Pupils are equal and round. They are symmetrically reactive from 3-->2 mm. EOMI without nystagmus. Facial sensation is intact to light touch. Face is symmetric at rest with normal strength and mobility.  Motor: Normal bulk, tone,  Sensation: Intact to light touch.  DTRs: 2+, symmetric      Pertinent Labs/Diagnostics: EEG shows no epileptiform activity. MRI of brain with and without contrast has been ordered but not done at this point  Etta Quill PA-C [facility] [phone]  Impression: At this point continue Keppra for seizures and may increase dose for comfort care. If goes to hospice for comfort care will sign off and feel free to call for information.       12/21/2016, 1:54 PM

## 2016-12-21 NOTE — Progress Notes (Signed)
CSW following to assist with d/c planning. Pt has [employer] which requires prior authorization for SNF placement. Pt must be able to participate in PT / OT in order for insurance to assist with SNF cost. CSW can assist with pvt pay SNF placement if pt is unable to participate in rehab. Palliative Care Team is meeting today. CSW will review PN and assist as needed with dc planning.  Werner Lean LCSW [phone]

## 2016-12-21 NOTE — Consult Note (Signed)
Consultation Note Date: 12/21/2016   Patient Name: Annette Farmer  DOB: Jan 11, 1946  MRN: [id number]  Age / Sex: 71 y.o., female  PCP: Wardell Honour, MD Referring Physician: Elmarie Shiley, MD  Reason for Consultation: Establishing goals of care  HPI/Patient Profile: 71 y.o. female  with past medical history of stroke, metastatic ovarian cancer with carcinomatosis, partial bowel obs (thought to be related to metastatic cancer), DM, depression who was admitted on 12/19/2016 with focal seizures, anemia, and CVA.  Per EPIC notes she was sent to the ER from [facility] for abnormal labs.  It appears her hgb has dropped from 11-12 in early July 2018 to 8-9 range.  In transport the patient was noted to have left sided facial and upper extremity twitching.  MR brain at the time of admission showed a possible early infarct in the left parietal and left occipital lobes.   Clinical Assessment and Goals of Care:  I have reviewed medical records including EPIC notes, labs and imaging, received report from her attending physician, assessed the patient and spoken on the phone with her Christeen Douglas.  We have scheduled a meeting at [place] at 11:00 on 8/1  to discuss diagnosis prognosis, [place], EOL wishes, disposition and options.  On exam Annette Farmer is groggy but able to answer a few questions.  She denies pain and nausea.  She is not having any trouble seeing or following commands.  She states "I want to get out of here".  In my prior meetings with Annette Farmer (June 2018) I learned the following.  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  She is a Materials engineer.  She is pleasant and agreeable but also very strong willed and stubborn.    She has no immediate family.  She is supported by her close friends from [facility] in [place]) and she trusts her brother-in-law Fredirick Lathe as her HCPOA and durable POA.    When she was initially diagnosed with metastatic cancer she did not want treatment.  She does not like to leave her apartment and was not inclined to seek medical care.  However with strong encouragement from her friends and the possibility for excellent results from chemo and radiation, Annette Farmer agreed to treatment and SNF placement.  Unfortunately things have not gone well.  Annette Farmer has failed to thrive, feeling poorly she has refused to participate with physical therapy at SNF, she is not eating well.  In general she has been miserable.  Oncology is recommending no further chemotherapy.   Primary Decision Maker:  PATIENT and Annette Farmer [phone])    SUMMARY OF RECOMMENDATIONS    I will meet with the patient and her HCPOA on 8/1 at 11:00 am.  Code Status/Advance Care Planning: DNR  Symptom Management:  Per primary team at this point.   Will reassess on 8/1   Psycho-social/Spiritual:  Desire for further Chaplaincy support: yes   Prognosis:  To be determined.  Discharge Planning: To Be Determined  SNF vs Hospice House.      Primary Diagnoses: Present on Admission: . HYPERTENSION, BENIGN SYSTEMIC . DVT (deep venous thrombosis) ([place]) . Carcinomatosis ([place]) . Severe protein-calorie malnutrition ([place]) . Failure to thrive in adult . Hypokalemia . SIRS (systemic inflammatory response syndrome) (HCC) . Normocytic anemia . Hypotension   I have reviewed the medical record, interviewed the patient and family, and examined the patient. The following aspects are pertinent.  Past Medical History:  Diagnosis Date  . Depression   . Diabetes mellitus without complication ([place])   . Hypertension   . Stroke [facility])    Social History   Social History  . Marital status: Single    Spouse name: N/A  . Number of children: N/A  . Years of education: N/A   Social History Main Topics  . Smoking status: Never Smoker  . Smokeless  tobacco: Never Used  . Alcohol use No  . Drug use: No  . Sexual activity: No   Other Topics Concern  . None   Social History Narrative   Marital status: single; not dating      Children: none      Live: alone      Employment: retired in 1997; Corporate treasurer      Tobacco: never      Alcohol: drinks "a little" 3 bottles of liquor and 3 bottles of wine      Exercise: none in 2018   Family History  Problem Relation Age of Onset  . Parkinson's disease Mother   . Dementia Father   . Cancer Sister 68       Breast cancer   Scheduled Meds: . aspirin  300 mg Rectal Daily  . bisacodyl  10 mg Rectal Daily  . Chlorhexidine Gluconate Cloth  6 each Topical Daily  . enoxaparin  85 mg Subcutaneous Q12H  . famotidine  20 mg Oral Daily  . feeding supplement  237 mL Oral TID WC  . gabapentin  300 mg Oral QHS  . insulin aspart  0-9 Units Subcutaneous Q4H  . levETIRAcetam  750 mg Oral BID  . metoCLOPramide  5 mg Oral TID AC  . mirtazapine  15 mg Oral QHS  . pantoprazole  40 mg Oral Q0600  . polyethylene glycol  17 g Oral Daily  . potassium chloride SA  20 mEq Oral Daily  . senna-docusate  2 tablet Oral BID  . sodium chloride flush  3 mL Intravenous Q12H   Continuous Infusions: . sodium chloride 125 mL/hr at 12/21/16 0600  . ceFEPime (MAXIPIME) IV Stopped (12/21/16 0545)  . sodium chloride    . vancomycin Stopped (12/21/16 0654)   PRN Meds:.acetaminophen **OR** acetaminophen, albuterol, alum & mag hydroxide-simeth, gadobenate dimeglumine, guaiFENesin-dextromethorphan, HYDROcodone-acetaminophen, LORazepam, ondansetron **OR** ondansetron (ZOFRAN) IV, traMADol Allergies  Allergen Reactions  . Penicillins Nausea Only    Has patient had a PCN reaction causing immediate rash, facial/tongue/throat swelling, SOB or lightheadedness with hypotension: NO Has patient had a PCN reaction causing severe rash involving mucus membranes or skin necrosis: NO Has patient had a PCN reaction that required  hospitalization: NO Has patient had a PCN reaction occurring within the last 10 years: NO If all of the above answers are "NO", then may proceed with Cephalosporin use.    Review of Systems Patient to sleepy  Physical Exam  Pale, appears drawn (compared to June).  Groggy but able to answer a few questions appropriately.   CV tachy resp no  distress Abdomen with distention  Vital Signs: BP 109/62   Pulse (!) 102   Temp 98.2 F (36.8 C) (Oral)   Resp 11   Ht 5' 1.5" (1.562 m)   Wt 70.7 kg (155 lb 13.8 oz)   SpO2 96%   BMI 28.97 kg/m  Pain Assessment: 0-10   Pain Score: Asleep   SpO2: SpO2: 96 % O2 Device:SpO2: 96 % O2 Flow Rate: .   IO: Intake/output summary:  Intake/Output Summary (Last 24 hours) at 12/21/16 0651 Last data filed at 12/21/16 0600  Gross per 24 hour  Intake           1812.5 ml  Output              500 ml  Net           1312.5 ml    LBM: Last BM Date: 12/21/16 Baseline Weight: Weight: 66.6 kg (146 lb 13.2 oz) Most recent weight: Weight: 70.7 kg (155 lb 13.8 oz)     Palliative Assessment/Data:   Flowsheet Rows     Most Recent Value  Intake Tab  Referral Department  Hospitalist  Unit at Time of Referral  Intermediate Care Unit  Palliative Care Primary Diagnosis  Cancer  Date Notified  12/20/16  Palliative Care Type  Return patient Palliative Care  Reason for referral  Clarify Goals of Care  Date of Admission  12/19/16  Date first seen by Palliative Care  12/20/16  # of days Palliative referral response time  0 Day(s)  # of days IP prior to Palliative referral  1  Clinical Assessment  Palliative Performance Scale Score  30%  Psychosocial & Spiritual Assessment  Palliative Care Outcomes  Patient/Family meeting held?  Yes  Who was at the meeting?  patient  Palliative Care Outcomes  Clarified goals of care      Time In: 3:00 Time Out: 3:30 Time Total: 30 min. Greater than 50%  of this time was spent counseling and coordinating care  related to the above assessment and plan.  Signed by: Florentina Jenny, PA-C Palliative Medicine Pager: [phone]  Please contact Palliative Medicine Team phone at [phone] for questions and concerns.  For individual provider: See Shea Evans

## 2016-12-21 NOTE — Progress Notes (Signed)
Patient was given Stroke Swallow Screen. She passed with no exceptions. She is cleared for a diet.

## 2016-12-21 NOTE — Progress Notes (Signed)
PROGRESS NOTE    Annette Farmer  TKW:[id number] DOB: 07-07-1945 DOA: 12/19/2016 PCP: Wardell Honour, MD   Brief Narrative:   Annette Farmer is a 71 y.o. female with medical history significant for metastatic ovarian cancer, insulin-dependent diabetes mellitus, hypertension, GERD, and DVT on therapeutic Lovenox, presenting from her nursing facility for evaluation of abnormal blood work and twitching movements.Neurology was consulted by the ED physician, suspects a focal seizure, and recommends medical admission with MRI and Keppra. Patient was loaded with Keppra in the ED. She is admitted to the stepdown unit for ongoing evaluation and management of suspected focal seizures and SIRS with hypotension.  Patient is now admitted to the hospitalist medicine service with focal seizures, SIRS, no clear source, in the setting of metastatic ovarian cancer.   7/31: MRI Brain with early to sub acute L parietal and occipital ischemic activity.    Assessment & Plan:   Principal Problem:   Focal seizures ([place]) Active Problems:   Type 2 diabetes mellitus (HCC)   HYPERTENSION, BENIGN SYSTEMIC   Hemiparesis affecting right side as late effect of cerebrovascular accident ([place])   Refusal of statin medication by patient   DVT (deep venous thrombosis) (HCC)   Carcinomatosis (HCC)   Severe protein-calorie malnutrition (HCC)   Failure to thrive in adult   Hypokalemia   SIRS (systemic inflammatory response syndrome) (HCC)   Normocytic anemia   Hypotension   Pressure injury of skin   Focal seizure ([place])    1. Focal Seizures, now with early to sub acute ischemic stroke: Continue with  [facility] following, appreciate input.  Stroke order set initiated Needs swallow evaluation.   2. Acute to early sub acute stroke;  MRI; 3 sub centimeter  foci left parietal and occipital lobe.  ECHO EF 55 %, limited study. , MRI brain, MRA ordered. Pending.  Ok to continue with lovenox, therapeutic dose.  Discussed with neurology/.  SIRS, unclear infectious course, concern for sepsis: Blood culture; no growth to date.  Chest x ray; subpulmonic pleural fluid and/or ascites under the right hemidiaphragm may be present. Continue broad spectrum antibiotics Vanc, and cefepime./   3.Hypokalemia: Will order IV KCL.   4. Abdominal distension, ? Ascites: Stroke work up today, monitor for paracentesis, at least for comfort purposes later.  Denies abdominal pain.    DVT prophylaxis: SQ lovenox.  Code Status: DNR Family Communication:   Disposition Plan: 1. Pending stroke work up and neurologist input.  2. Pending palliative input, may need hospice at d/c.   Consultants:   Neurology  Palliative   Procedures:  Stroke work up has been initiated: MRI brain with contrast, MRA brain, ECHO.  ECHO; Left ventricle: The cavity size was normal. Wall thickness was   normal. Systolic function was normal. The estimated ejection   fraction was in the range of 55% to 60%. Although no diagnostic   regional wall motion abnormality was identified, this possibility   cannot be completely excluded on the basis of this study. The   study is not technically sufficient to allow evaluation of LV   diastolic function.  Impressions:  - Limited study, especially difficult and limited apical view   assessment. COnsider repeat study when the patient can cooperate   with the exam.   Antimicrobials:  Empiric Vanc and Zosyn. Cultures and additional infectious work up pending. D/C Zosyn and being Cefepime as Zosyn may lower seizure threshold.   Subjective: She is sleepy, but wake up and answer some questions.  She is hungry.  She wants to eat.    Objective: Vitals:   12/21/16 0400 12/21/16 0403 12/21/16 0429 12/21/16 0600  BP: 129/76   109/62  Pulse: (!) 110   (!) 102  Resp: 14   11  Temp:  98.2 F (36.8 C)    TempSrc:  Oral    SpO2: 98%   96%  Weight:   70.7 kg (155 lb 13.8 oz)   Height:         Intake/Output Summary (Last 24 hours) at 12/21/16 0807 Last data filed at 12/21/16 0654  Gross per 24 hour  Intake           1912.5 ml  Output              500 ml  Net           1412.5 ml   Filed Weights   12/19/16 1700 12/20/16 0500 12/21/16 0429  Weight: 66.6 kg (146 lb 13.2 oz) 68.9 kg (151 lb 14.4 oz) 70.7 kg (155 lb 13.8 oz)    Examination:  General exam: NAD Respiratory system: CTA Cardiovascular system: S 1, S 2 RRR Gastrointestinal system: soft, mildly distended, no rigidity  Central nervous system: alert, follows command,.  Extremities: moves all 4 extremities.  Skin: No rashes, lesions or ulcers Psychiatry:  Unable to assess      Data Reviewed: I have personally reviewed following labs and imaging studies  CBC:  Recent Labs Lab 12/19/16 1228 12/20/16 0525 12/21/16 0407  WBC 15.0* 13.5* 11.6*  NEUTROABS 12.2* 11.5*  --   HGB 8.4* 8.0* 8.1*  HCT 26.2* 24.7* 24.6*  MCV 92.9 95.0 94.3  PLT 152 141* 557*   Basic Metabolic Panel:  Recent Labs Lab 12/19/16 1228 12/20/16 0525 12/21/16 0407  NA 134* 138 139  K 2.8* 3.5 3.4*  CL 97* 104 108  CO2 29 26 23   GLUCOSE 97 97 82  BUN 8 8 9   CREATININE 0.89 0.79 0.98  CALCIUM 8.0* 7.7* 8.0*   GFR: Estimated Creatinine Clearance: 47.3 mL/min (by C-G formula based on SCr of 0.98 mg/dL). Liver Function Tests:  Recent Labs Lab 12/19/16 1228 12/20/16 0525  AST 30 27  ALT 28 23  ALKPHOS 119 114  BILITOT 0.6 0.6  PROT 6.0* 4.9*  ALBUMIN 2.9* 2.4*   No results for input(s): LIPASE, AMYLASE in the last 168 hours.  Recent Labs Lab 12/19/16 1653  AMMONIA 20   Coagulation Profile:  Recent Labs Lab 12/19/16 1646  INR 1.13   Cardiac Enzymes: No results for input(s): CKTOTAL, CKMB, CKMBINDEX, TROPONINI in the last 168 hours. BNP (last 3 results) No results for input(s): PROBNP in the last 8760 hours. HbA1C: No results for input(s): HGBA1C in the last 72 hours. CBG:  Recent Labs Lab  12/20/16 1144 12/20/16 1555 12/20/16 2024 12/20/16 2347 12/21/16 0346  GLUCAP 92 92 83 90 73   Lipid Profile:  Recent Labs  12/21/16 0407  CHOL 116  HDL 20*  LDLCALC 67  TRIG 147  CHOLHDL 5.8   Thyroid Function Tests: No results for input(s): TSH, T4TOTAL, FREET4, T3FREE, THYROIDAB in the last 72 hours. Anemia Panel: No results for input(s): VITAMINB12, FOLATE, FERRITIN, TIBC, IRON, RETICCTPCT in the last 72 hours. Sepsis Labs:  Recent Labs Lab 12/19/16 1646 12/19/16 2350  LATICACIDVEN 0.9 0.9    Recent Results (from the past 240 hour(s))  Blood culture (routine x 2)     Status: None (Preliminary result)   Collection  Time: 12/19/16  4:53 PM  Result Value Ref Range Status   Specimen Description BLOOD LEFT ANTECUBITAL  Final   Special Requests   Final    BOTTLES DRAWN AEROBIC ONLY Blood Culture adequate volume   Culture   Final    NO GROWTH < 24 HOURS Performed at [facility], [address] [place], [place] [address]    Report Status PENDING  Incomplete  Blood culture (routine x 2)     Status: None (Preliminary result)   Collection Time: 12/19/16  4:53 PM  Result Value Ref Range Status   Specimen Description BLOOD LEFT WRIST  Final   Special Requests   Final    BOTTLES DRAWN AEROBIC ONLY Blood Culture adequate volume   Culture   Final    NO GROWTH < 24 HOURS Performed at [facility], [place] [address], [place], [place] [address]    Report Status PENDING  Incomplete  MRSA PCR Screening     Status: None   Collection Time: 12/19/16  5:40 PM  Result Value Ref Range Status   MRSA by PCR NEGATIVE NEGATIVE Final    Comment:        The GeneXpert MRSA Assay (FDA approved for NASAL specimens only), is one component of a comprehensive MRSA colonization surveillance program. It is not intended to diagnose MRSA infection nor to guide or monitor treatment for MRSA infections.          Radiology Studies: Ct Head Wo Contrast  Result Date:  12/19/2016 CLINICAL DATA:  Left arm twitching today. EXAM: CT HEAD WITHOUT CONTRAST TECHNIQUE: Contiguous axial images were obtained from the base of the skull through the vertex without intravenous contrast. COMPARISON:  Head CT scan 01/09/2011.  Brain MRI 01/10/2011. FINDINGS: Brain: There is chronic microvascular ischemic change and mild atrophy. No evidence of acute abnormality including hemorrhage, infarct, mass lesion, mass effect, midline shift or abnormal extra-axial fluid collection. No hydrocephalus or pneumocephalus. Vascular: Atherosclerosis noted. Skull: Intact. Sinuses/Orbits: Negative. Other: None. IMPRESSION: No acute abnormality. Atrophy and chronic microvascular ischemic change. Atherosclerosis. Electronically Signed   By: Inge Rise M.D.   On: 12/19/2016 13:47   Mr Brain Wo Contrast  Result Date: 12/19/2016 CLINICAL DATA:  Dizziness EXAM: MRI HEAD WITHOUT CONTRAST TECHNIQUE: Multiplanar, multiecho pulse sequences of the brain and surrounding structures were obtained without intravenous contrast. COMPARISON:  Brain MRI 01/10/2011 FINDINGS: The examination had to be discontinued prior to completion due to patient refusal to continue. Multiple sequences are markedly motion degraded. Brain: The midline structures are normal. There are 3 small foci of diffusion restriction within the posterior left parietal lobe and left occipital lobe. There is multifocal hyperintense T2-weighted signal within the periventricular white matter, most often seen in the setting of chronic microvascular ischemia. No intraparenchymal hematoma or chronic microhemorrhage. Brain volume is normal for age without age-advanced or lobar predominant atrophy. The dura is normal and there is no extra-axial collection. Vascular: Major intracranial arterial and venous sinus flow voids are preserved. Skull and upper cervical spine: The visualized skull base, calvarium, upper cervical spine and extracranial soft tissues are  normal. Sinuses/Orbits: No fluid levels or advanced mucosal thickening. No mastoid or middle ear effusion. Normal orbits. IMPRESSION: 1. Truncated, motion degraded examination. 2. 3 subcentimeter foci of acute to early subacute ischemia within the posterior left parietal and left occipital lobes. No hemorrhage or mass effect. Electronically Signed   By: Ulyses Jarred M.D.   On: 12/19/2016 21:17   Dg  Chest Port 1 View  Result Date: 12/19/2016 CLINICAL DATA:  Shortness of breath, weakness and anemia. History of ovarian carcinoma with peritoneal carcinomatosis and ascites. EXAM: PORTABLE CHEST 1 VIEW COMPARISON:  11/10/2016 FINDINGS: The heart size is normal. There is stable tortuosity of the thoracic aorta. Port-A-Cath present with catheter tip in the SVC. There is some elevation of the right hemidiaphragm with some associated atelectasis at the right lung base. Based on appearance, a component of subpulmonic pleural fluid and/or ascites causing elevation of the right hemidiaphragm may be present. There is known peritoneal carcinomatosis and ascites by prior imaging. IMPRESSION: Elevated right hemidiaphragm. Based on chest x-ray appearance, a component of subpulmonic pleural fluid and/or ascites under the right hemidiaphragm may be present. Electronically Signed   By: Aletta Edouard M.D.   On: 12/19/2016 18:31        Scheduled Meds: . aspirin  300 mg Rectal Daily  . bisacodyl  10 mg Rectal Daily  . Chlorhexidine Gluconate Cloth  6 each Topical Daily  . enoxaparin  85 mg Subcutaneous Q12H  . famotidine  20 mg Oral Daily  . feeding supplement  237 mL Oral TID WC  . gabapentin  300 mg Oral QHS  . insulin aspart  0-9 Units Subcutaneous Q4H  . levETIRAcetam  750 mg Oral BID  . metoCLOPramide  5 mg Oral TID AC  . mirtazapine  15 mg Oral QHS  . pantoprazole  40 mg Oral Q0600  . polyethylene glycol  17 g Oral Daily  . potassium chloride SA  20 mEq Oral Daily  . senna-docusate  2 tablet Oral BID  .  sodium chloride flush  3 mL Intravenous Q12H   Continuous Infusions: . sodium chloride 125 mL/hr at 12/21/16 0600  . ceFEPime (MAXIPIME) IV Stopped (12/21/16 0545)  . sodium chloride    . vancomycin Stopped (12/21/16 0654)     LOS: 2 days    Time spent: 35 min     Regalado, Cassie Freer, MD [facility] Pager [phone]   If 7PM-7AM, please contact night-coverage [email or web address] Password [email or web address] 12/21/2016, 8:07 AM

## 2016-12-21 NOTE — Evaluation (Signed)
Speech Language Pathology Evaluation Patient Details Name: Annette Farmer MRN: [id number] DOB: 03-10-1946 Today's Date: 12/21/2016 Time: 1856-3149 SLP Time Calculation (min) (ACUTE ONLY): 30 min  Problem List:  Patient Active Problem List   Diagnosis Date Noted  . Hypokalemia 12/19/2016  . SIRS (systemic inflammatory response syndrome) ([place]) 12/19/2016  . Focal seizures ([place]) 12/19/2016  . Normocytic anemia 12/19/2016  . Hypotension 12/19/2016  . Pressure injury of skin 12/19/2016  . Focal seizure ([place])   . Failure to thrive in adult 12/13/2016  . Peritonitis ([place]) 12/02/2016  . Peritoneal carcinomatosis ([place])   . Ovarian cancer on right [facility]): Stage 3C 11/16/2016  . Ileus ([place]) 11/16/2016  . Acute thromboembolism of deep veins of right lower extremity (HCC)   . Port-a-cath in place   . DNR (do not resuscitate)   . Metastatic cancer ([place])   . Palliative care encounter   . Goals of care, counseling/discussion   . Bowel obstruction ([place])   . Severe protein-calorie malnutrition ([place])   . AKI (acute kidney injury) ([place]) 11/11/2016  . Pelvic mass 11/11/2016  . Carcinomatosis ([place]) 11/11/2016  . DVT (deep venous thrombosis) ([place]) 11/10/2016  . Refusal of statin medication by patient 09/28/2016  . Hemiparesis affecting right side as late effect of cerebrovascular accident ([place]) 04/23/2012  . Type 2 diabetes mellitus ([place]) 07/20/2006  . HYPERCHOLESTEROLEMIA 07/20/2006  . OBESITY, NOS 07/20/2006  . HYPERTENSION, BENIGN SYSTEMIC 07/20/2006  . HEMORRHOIDS, NOS 07/20/2006  . CONSTIPATION 07/20/2006  . INCONTINENCE, STRESS, FEMALE 07/20/2006  . MENOPAUSAL SYNDROME 07/20/2006   Past Medical History:  Past Medical History:  Diagnosis Date  . Depression   . Diabetes mellitus without complication ([place])   . Hypertension   . Stroke [facility])    Past Surgical History:  Past Surgical History:  Procedure Laterality Date  . HERNIA REPAIR    . IR FLUORO GUIDE PORT INSERTION LEFT  11/25/2016   . IR PARACENTESIS  11/11/2016  . IR US GUIDE VASC ACCESS LEFT  11/25/2016  . UMBILICAL HERNIA REPAIR     HPI:  71 yo female adm to [facility] from rehab with AMS, focal seizures.  Pt found to have left parietal and occipital early subacute ischemia.  Pt PMH + for ovarian cancer, ? ascities, FTT, decreased participation in PT.  She has been seen by palliative care.  Speech evaluation was ordered.    Assessment / Plan / Recommendation Clinical Impression  CN exam unremarkable but pt does demonstrate weak phonatory strength.  SLP educated her to means for compensation including turning off the tv, decreasing background noise (close door, ask visitors to cease talking, etc) when talking to staff/friends to improve communication. Also consideration for amplifier reviewed.    Limited MOCA 7.3 administered due to pt's weakness impairing ability to write.  Pt scored 16/25 indicative of cognitive impairment.  Areas of strengths include attention, language, abstract thought and orientation.  Pt did have diffiuclty with recalling five words - primarly retrieval deficit as she could identify or name with category or multiple choice cue.  Of note, attention was interrupted by IV beeping and pt report of painful bowel movement.  As pt has supportive environment - eg hospital/rehab - no further SLP warranted.     SLP Assessment  SLP Recommendation/Assessment: Patient does not need any further Speech [place] [facility] SLP Visit Diagnosis: Cognitive communication deficit (R41.841)    Follow Up Recommendations  None    Frequency and Duration  SLP Evaluation Cognition  Overall Cognitive Status: No family/caregiver present to determine baseline cognitive functioning Arousal/Alertness: Awake/alert Orientation Level: Oriented X4 (except day of week) Attention: Sustained Sustained Attention: Appears intact Memory: Impaired (recalled 1/5 words without cues, 3/5 with cue) Memory Impairment:  Retrieval deficit Awareness: Appears intact Problem Solving: Appears intact (concern for calling for clean up due to "roughness" of cleaning but aware of need to be cleaned due to wound risk) Safety/Judgment: Appears intact       Comprehension  Auditory Comprehension Overall Auditory Comprehension: Appears within functional limits for tasks assessed Yes/No Questions: Not tested Commands: Within Functional Limits Conversation: Complex Visual Recognition/Discrimination Discrimination: Within Function Limits Reading Comprehension Reading Status: Within funtional limits (for reading date)    Expression Expression Primary Mode of Expression: Verbal Verbal Expression Overall Verbal Expression: Appears within functional limits for tasks assessed Initiation: No impairment Level of Generative/Spontaneous Verbalization: Conversation Repetition: No impairment Naming: No impairment Written Expression Dominant Hand: Left (was right handed prior to cva in 2012) Written Expression: Not tested   Oral / Motor  Oral Motor/Sensory Function Overall Oral Motor/Sensory Function: Generalized oral weakness Motor Speech Overall Motor Speech: Impaired Phonation: Low vocal intensity (pt reports this to be new since yesterday) Resonance: Within functional limits Articulation: Within functional limitis Intelligibility: Intelligible Motor Planning: Witnin functional limits Motor Speech Errors: Not applicable Effective Techniques: Increased vocal intensity (advised her to diminish background noise and consider purchasing an amplifier)   GO                    Macario Golds 12/21/2016, 10:44 AM  Luanna Salk, [facility] SLP [phone]

## 2016-12-22 DIAGNOSIS — E43 Unspecified severe protein-calorie malnutrition: Secondary | ICD-10-CM

## 2016-12-22 DIAGNOSIS — Z515 Encounter for palliative care: Secondary | ICD-10-CM

## 2016-12-22 LAB — HEMOGLOBIN A1C
HEMOGLOBIN A1C: 5.7 % — AB (ref 4.8–5.6)
Mean Plasma Glucose: 117 mg/dL

## 2016-12-22 MED ORDER — HEPARIN SOD (PORK) LOCK FLUSH 100 UNIT/ML IV SOLN
500.0000 [IU] | INTRAVENOUS | Status: AC | PRN
  Administered 2016-12-22: 500 [IU]

## 2016-12-22 MED ORDER — LEVETIRACETAM 750 MG PO TABS: 750.0000 mg | ORAL_TABLET | Freq: Two times a day (BID) | ORAL | 0 refills | Status: AC

## 2016-12-22 MED ORDER — CEFUROXIME AXETIL 250 MG PO TABS: 250.0000 mg | ORAL_TABLET | Freq: Two times a day (BID) | ORAL | 0 refills | Status: AC

## 2016-12-22 MED ORDER — GLYCOPYRROLATE 1 MG PO TABS: 1.0000 mg | ORAL_TABLET | ORAL | 0 refills | Status: DC | PRN

## 2016-12-22 MED ORDER — LORAZEPAM 2 MG/ML IJ SOLN: 1.0000 mg | INTRAMUSCULAR | Status: DC | PRN

## 2016-12-22 MED ORDER — GLYCOPYRROLATE 1 MG PO TABS: 1.0000 mg | ORAL_TABLET | ORAL | 0 refills | Status: AC | PRN

## 2016-12-22 MED ORDER — LORAZEPAM 2 MG/ML PO CONC: 0.6000 mg | Freq: Two times a day (BID) | ORAL | 0 refills | Status: AC

## 2016-12-22 MED ORDER — LORAZEPAM 2 MG/ML PO CONC: 0.5000 mg | Freq: Two times a day (BID) | ORAL | 0 refills | Status: DC

## 2016-12-22 MED ORDER — LORAZEPAM 2 MG/ML PO CONC: 0.6000 mg | Freq: Two times a day (BID) | ORAL | 0 refills | Status: DC

## 2016-12-22 MED ORDER — LORAZEPAM 1 MG PO TABS: 1.0000 mg | ORAL_TABLET | ORAL | 0 refills | Status: AC | PRN

## 2016-12-22 MED ORDER — BIOTENE DRY MOUTH MT LIQD: 15.0000 mL | OROMUCOSAL | 0 refills | Status: AC | PRN

## 2016-12-22 MED ORDER — MORPHINE SULFATE (CONCENTRATE) 10 MG/0.5ML PO SOLN: 5.0000 mg | ORAL | 0 refills | Status: AC | PRN

## 2016-12-22 MED ORDER — MORPHINE SULFATE (CONCENTRATE) 10 MG/0.5ML PO SOLN: 5.0000 mg | ORAL | 0 refills | Status: DC | PRN

## 2016-12-22 MED ORDER — LORAZEPAM 1 MG PO TABS: 1.0000 mg | ORAL_TABLET | ORAL | 0 refills | Status: DC | PRN

## 2016-12-22 MED ORDER — LORAZEPAM 2 MG/ML PO CONC: 1.0000 mg | ORAL | Status: DC | PRN

## 2016-12-22 MED ORDER — BIOTENE DRY MOUTH MT LIQD: 15.0000 mL | OROMUCOSAL | 0 refills | Status: DC | PRN

## 2016-12-22 MED ORDER — HALOPERIDOL 0.5 MG PO TABS: 0.5000 mg | ORAL_TABLET | ORAL | 0 refills | Status: AC | PRN

## 2016-12-22 MED ORDER — LORAZEPAM 1 MG PO TABS: 1.0000 mg | ORAL_TABLET | ORAL | Status: DC | PRN

## 2016-12-22 MED ORDER — LEVETIRACETAM 750 MG PO TABS: 750.0000 mg | ORAL_TABLET | Freq: Two times a day (BID) | ORAL | 0 refills | Status: DC

## 2016-12-22 MED ORDER — HALOPERIDOL 0.5 MG PO TABS: 0.5000 mg | ORAL_TABLET | ORAL | 0 refills | Status: DC | PRN

## 2016-12-22 NOTE — Progress Notes (Addendum)
CSW informed physician, patient has bed at [facility]. Waiting for D/C summary. PTAR to transport.  PTAR scheduled pick up for 2:30pm.

## 2016-12-22 NOTE — Progress Notes (Signed)
Report given to Colletta [place], Therapist, sports at [facility]. No questions or concerns at this time.

## 2016-12-22 NOTE — Discharge Summary (Signed)
Physician Discharge Summary  Annette Farmer CWC:[id number] DOB: 06-12-1945 DOA: 12/19/2016  PCP: Wardell Honour, MD  Admit date: 12/19/2016 Discharge date: 12/22/2016  Admitted From: Home  Disposition:  Residential Hospice.   Recommendations for Outpatient Follow-up:  1. transfer to residential hospice for comfort care.      Discharge Condition: Guarded.  CODE STATUS: DNR.  Diet recommendation: comfort feeding.   Brief/Interim Summary: Annette Gato Wiesneris a 71 y.o.femalewith medical history significant for metastatic ovarian cancer, insulin-dependent diabetes mellitus, hypertension, GERD, and DVT on therapeutic Lovenox, presenting from her nursing facility for evaluation of abnormal blood work and twitching movements.Neurology was consulted by the ED physician, suspects a focal seizure, and recommends medical admission with MRI and Keppra. Patient was loaded with Keppra in the ED. She is admitted to the stepdown unit for ongoing evaluation and management of suspected focal seizures and SIRSwith hypotension.  Patient is now admitted to the hospitalist medicine service with focal seizures, SIRS, no clear source, in the setting of metastatic ovarian cancer.   7/31: MRI Brain with early to sub acute L parietal and occipital ischemic activity.    Assessment & Plan:   Principal Problem:   Focal seizures ([name]) Active Problems:   Type 2 diabetes mellitus (HCC)   HYPERTENSION, BENIGN SYSTEMIC   Hemiparesis affecting right side as late effect of cerebrovascular accident ([name])   Refusal of statin medication by patient   DVT (deep venous thrombosis) (HCC)   Carcinomatosis (HCC)   Severe protein-calorie malnutrition (HCC)   Failure to thrive in adult   Hypokalemia   SIRS (systemic inflammatory response syndrome) (HCC)   Normocytic anemia   Hypotension   Pressure injury of skin   Focal seizure ([name])    1. Focal Seizures, now with early to sub acute ischemic stroke: Continue  with  Keppra at discharge.  Neurology following, appreciate input.  Stroke order set initiated   2. Acute to early sub acute stroke;  MRI; 3 sub centimeter  foci left parietal and occipital lobe.  ECHO EF 55 %, limited study. , MRI brain, MRA was ordered. Will be discontinue patient to be transfer to residential hospice for comfort care.  Ok to continue with lovenox, therapeutic dose. Discussed with neurology/. Lovenox discontinue by palliative care, goal is comfort.   SIRS, unclear infectious course, concern for sepsis: Blood culture; no growth to date.  Chest x ray; subpulmonic pleural fluid and/or ascites under the right hemidiaphragm may be present. Treated with spectrum antibiotics Vanc, and cefepime./  Discharge on Ceftin for 4 days.   3.Hypokalemia: Replaced.   4. Metastatic ovarian cancer; per oncologist note no further chemo.  Patient and family met with palliative care, goal is comfort care.   Abdominal distension, ? Ascites: Stroke work up today, monitor for paracentesis, at least for comfort purposes later.  Denies abdominal pain.   Discharge Diagnoses:  Principal Problem:   Focal seizures ([name]) Active Problems:   Type 2 diabetes mellitus (HCC)   HYPERTENSION, BENIGN SYSTEMIC   Hemiparesis affecting right side as late effect of cerebrovascular accident ([name])   Refusal of statin medication by patient   DVT (deep venous thrombosis) (HCC)   Carcinomatosis (HCC)   Severe protein-calorie malnutrition ([name])   Failure to thrive in adult   Hypokalemia   SIRS (systemic inflammatory response syndrome) (HCC)   Normocytic anemia   Hypotension   Pressure injury of skin   Focal seizure ([name])   Encounter for hospice care discussion   Palliative care by  specialist    Discharge Instructions  Discharge Instructions    Diet - low sodium heart healthy    Complete by:  As directed    Increase activity slowly    Complete by:  As directed      Allergies as of  12/22/2016      Reactions   Penicillins Nausea Only   Has patient had a PCN reaction causing immediate rash, facial/tongue/throat swelling, SOB or lightheadedness with hypotension: NO Has patient had a PCN reaction causing severe rash involving mucus membranes or skin necrosis: NO Has patient had a PCN reaction that required hospitalization: NO Has patient had a PCN reaction occurring within the last 10 years: NO If all of the above answers are "NO", then may proceed with Cephalosporin use.      Medication List    STOP taking these medications   COLLAGEN PO   enoxaparin 100 MG/ML injection Commonly known as:  LOVENOX   insulin aspart 100 UNIT/ML injection Commonly known as:  novoLOG   LASIX 40 MG tablet Generic drug:  furosemide   potassium chloride SA 20 MEQ tablet Commonly known as:  K-DUR,KLOR-CON   traMADol 50 MG tablet Commonly known as:  ULTRAM     TAKE these medications   acetaminophen 325 MG tablet Commonly known as:  TYLENOL Take 2 tablets (650 mg total) by mouth every 6 (six) hours as needed for mild pain (or Fever >/= 101).   albuterol (2.5 MG/3ML) 0.083% nebulizer solution Commonly known as:  PROVENTIL Take 3 mLs (2.5 mg total) by nebulization every 6 (six) hours as needed for wheezing or shortness of breath.   alum & mag hydroxide-simeth 200-200-20 MG/5ML suspension Commonly known as:  MAALOX/MYLANTA Take 30 mLs by mouth every 4 (four) hours as needed for indigestion or heartburn.   antiseptic oral rinse Liqd Apply 15 mLs topically as needed for dry mouth.   bisacodyl 10 MG suppository Commonly known as:  DULCOLAX Place 1 suppository (10 mg total) rectally daily.   cefUROXime 250 MG tablet Commonly known as:  CEFTIN Take 1 tablet (250 mg total) by mouth 2 (two) times daily.   feeding supplement Liqd Take 1 Container by mouth 3 (three) times daily with meals.   gabapentin 300 MG capsule Commonly known as:  NEURONTIN Take 1 capsule (300 mg total)  by mouth at bedtime.   glycopyrrolate 1 MG tablet Commonly known as:  ROBINUL Take 1 tablet (1 mg total) by mouth every 4 (four) hours as needed (excessive secretions).   guaiFENesin-dextromethorphan 100-10 MG/5ML syrup Commonly known as:  ROBITUSSIN DM Take 5 mLs by mouth every 4 (four) hours as needed for cough.   haloperidol 0.5 MG tablet Commonly known as:  HALDOL Take 1 tablet (0.5 mg total) by mouth every 4 (four) hours as needed for agitation (or delirium).   levETIRAcetam 750 MG tablet Commonly known as:  KEPPRA Take 1 tablet (750 mg total) by mouth 2 (two) times daily.   LORazepam 1 MG tablet Commonly known as:  ATIVAN Take 1 tablet (1 mg total) by mouth every 4 (four) hours as needed for anxiety.   LORazepam 2 MG/ML concentrated solution Commonly known as:  ATIVAN Take 0.3 mLs (0.6 mg total) by mouth 2 (two) times daily.   metoCLOPramide 10 MG tablet Commonly known as:  REGLAN Take 0.5 tablets (5 mg total) by mouth 3 (three) times daily before meals.   metoprolol succinate 25 MG 24 hr tablet Commonly known as:  TOPROL-XL Take 1  tablet (25 mg total) by mouth daily.   mirtazapine 15 MG tablet Commonly known as:  REMERON Take 15 mg by mouth at bedtime.   morphine CONCENTRATE 10 MG/0.5ML Soln concentrated solution Take 0.25 mLs (5 mg total) by mouth every 2 (two) hours as needed for moderate pain (or dyspnea).   ondansetron 4 MG tablet Commonly known as:  ZOFRAN Take 1 tablet (4 mg total) by mouth daily as needed for nausea or vomiting.   pantoprazole 40 MG tablet Commonly known as:  PROTONIX Take 1 tablet (40 mg total) by mouth daily at 6 (six) AM.   polyethylene glycol packet Commonly known as:  MIRALAX / GLYCOLAX Take 17 g by mouth daily.   ranitidine 75 MG tablet Commonly known as:  ZANTAC Take 75 mg by mouth once.   senna-docusate 8.6-50 MG tablet Commonly known as:  Senokot-S Take 2 tablets by mouth 2 (two) times daily.       Allergies   Allergen Reactions  . Penicillins Nausea Only    Has patient had a PCN reaction causing immediate rash, facial/tongue/throat swelling, SOB or lightheadedness with hypotension: NO Has patient had a PCN reaction causing severe rash involving mucus membranes or skin necrosis: NO Has patient had a PCN reaction that required hospitalization: NO Has patient had a PCN reaction occurring within the last 10 years: NO If all of the above answers are "NO", then may proceed with Cephalosporin use.     Consultations:  Palliative care  Neurology    Procedures/Studies: Ct Head Wo Contrast  Result Date: 12/19/2016 CLINICAL DATA:  Left arm twitching today. EXAM: CT HEAD WITHOUT CONTRAST TECHNIQUE: Contiguous axial images were obtained from the base of the skull through the vertex without intravenous contrast. COMPARISON:  Head CT scan 01/09/2011.  Brain MRI 01/10/2011. FINDINGS: Brain: There is chronic microvascular ischemic change and mild atrophy. No evidence of acute abnormality including hemorrhage, infarct, mass lesion, mass effect, midline shift or abnormal extra-axial fluid collection. No hydrocephalus or pneumocephalus. Vascular: Atherosclerosis noted. Skull: Intact. Sinuses/Orbits: Negative. Other: None. IMPRESSION: No acute abnormality. Atrophy and chronic microvascular ischemic change. Atherosclerosis. Electronically Signed   By: Inge Rise M.D.   On: 12/19/2016 13:47   Mr Brain Wo Contrast  Result Date: 12/19/2016 CLINICAL DATA:  Dizziness EXAM: MRI HEAD WITHOUT CONTRAST TECHNIQUE: Multiplanar, multiecho pulse sequences of the brain and surrounding structures were obtained without intravenous contrast. COMPARISON:  Brain MRI 01/10/2011 FINDINGS: The examination had to be discontinued prior to completion due to patient refusal to continue. Multiple sequences are markedly motion degraded. Brain: The midline structures are normal. There are 3 small foci of diffusion restriction within the  posterior left parietal lobe and left occipital lobe. There is multifocal hyperintense T2-weighted signal within the periventricular white matter, most often seen in the setting of chronic microvascular ischemia. No intraparenchymal hematoma or chronic microhemorrhage. Brain volume is normal for age without age-advanced or lobar predominant atrophy. The dura is normal and there is no extra-axial collection. Vascular: Major intracranial arterial and venous sinus flow voids are preserved. Skull and upper cervical spine: The visualized skull base, calvarium, upper cervical spine and extracranial soft tissues are normal. Sinuses/Orbits: No fluid levels or advanced mucosal thickening. No mastoid or middle ear effusion. Normal orbits. IMPRESSION: 1. Truncated, motion degraded examination. 2. 3 subcentimeter foci of acute to early subacute ischemia within the posterior left parietal and left occipital lobes. No hemorrhage or mass effect. Electronically Signed   By: Cletus Gash.D.  On: 12/19/2016 21:17   Ct Abdomen Pelvis W Contrast  Result Date: 12/02/2016 CLINICAL DATA:  71 y/o F; r/o sbo, Hx of ovarian CA, now w n/v, abd discomfort. EXAM: CT ABDOMEN AND PELVIS WITH CONTRAST TECHNIQUE: Multidetector CT imaging of the abdomen and pelvis was performed using the standard protocol following bolus administration of intravenous contrast. CONTRAST:  174m ISOVUE-300 IOPAMIDOL (ISOVUE-300) INJECTION 61% COMPARISON:  11/12/2014 CT abdomen and pelvis. FINDINGS: Lower chest: Coronary artery calcifications. Small bilateral pleural effusions. Hepatobiliary: Hepatic steatosis. Cholelithiasis. No intra or extrahepatic biliary ductal dilatation. Pancreas: Unremarkable. No pancreatic ductal dilatation or surrounding inflammatory changes. Spleen: Normal in size without focal abnormality. Adrenals/Urinary Tract: Adrenal glands are unremarkable. Kidneys are normal, without renal calculi, focal lesion, or hydronephrosis. Bladder is  unremarkable. Stomach/Bowel: No obstructive or inflammatory changes of bowel. Large volume of stool in rectum, question constipation. Vascular/Lymphatic: Aortic atherosclerosis. No enlarged abdominal or pelvic lymph nodes. Reproductive: Stable 4 cm left ovarian cyst. Stable right adnexal mass measuring 59 x 51 x 36 mm (AP x ML x CC series 2, image 71 and series 3, image 62). Other: Moderate volume of peritoneal ascites is increased from prior CT of abdomen and pelvis. Nodular thickening within the omentum is decreased from the prior CT. There is persistent peritoneal carcinomatosis within omentum and pericolic gutters with small foci of enhancement. Increased anasarca within the abdominal wall. Musculoskeletal: No acute or significant osseous findings. IMPRESSION: 1. Moderate volume of ascites increased from prior CT and increased anasarca. 2. Omental and peritoneal carcinomatosis is mildly decreased. 3. Stable right adnexal mass measuring up to 5.9 cm. 4. Hepatic steatosis.  Cholelithiasis. 5. Small bilateral pleural effusions. 6. Large volume of stool in rectum, question constipation. Electronically Signed   By: LKristine GarbeM.D.   On: 12/02/2016 19:35   [place]oreaAbdomen Limited  Result Date: 12/05/2016 CLINICAL DATA:  Evaluate for abdominal ascites and perform ultrasound-guided paracentesis as indicated. EXAM: LIMITED ABDOMEN ULTRASOUND FOR ASCITES TECHNIQUE: Limited ultrasound survey for ascites was performed in all four abdominal quadrants. COMPARISON:  Ascites search ultrasound - 12/03/2016; CT abdomen pelvis - 12/02/2016 FINDINGS: Four quadrant grayscale images are provided of the abdomen and demonstrates only a trace to small amount of peritoneal fluid with largest collection with the left upper abdominal quadrant, similar to abdominal CT performed 12/02/2016 and too small to allow for safe ultrasound-guided paracentesis. IMPRESSION: Trace to small amount of intra-abdominal ascites, too small to  allow for safe ultrasound-guided paracentesis. Electronically Signed   By: JSandi MariscalM.D.   On: 12/05/2016 10:59   [place]Abdomen Limited  Result Date: 12/03/2016 CLINICAL DATA:  Ascites. EXAM: LIMITED ABDOMEN ULTRASOUND FOR ASCITES TECHNIQUE: Limited ultrasound survey for ascites was performed in all four abdominal quadrants. COMPARISON:  Abdomen and pelvis CT dated 12/02/2016. FINDINGS: Small moderate amount of free peritoneal fluid. IMPRESSION: Small to moderate amount of ascites. Electronically Signed   By: SClaudie ReveringM.D.   On: 12/03/2016 10:13   Ir [place] Vasc Access Left  Result Date: 11/25/2016 INDICATION: 71year old with peritoneal carcinomatosis. Port-A-Cath needed for treatment. The patient's white blood cell count is markedly low and abnormal. This was discussed with the patient's oncologist, Dr. GAlvy Bimler who felt the patient was safe for Port-A-Cath placement. EXAM: FLUOROSCOPIC AND ULTRASOUND GUIDED PLACEMENT OF A SUBCUTANEOUS PORT. Physician: AStephan Minister HAnselm Pancoast MD MEDICATIONS: Cleocin 600 mg administered intravenously within one hour of incision. ANESTHESIA/SEDATION: Versed 1.0 mg IV; Fentanyl 25 mcg IV; Moderate Sedation Time:  40 minutes The patient was  continuously monitored during the procedure by the interventional radiology nurse under my direct supervision. FLUOROSCOPY TIME:  54 seconds, 7 mGy COMPLICATIONS: None immediate. PROCEDURE: The risks of the procedure were explained to the patient. Informed consent was obtained. Patient was placed supine on the interventional table. Ultrasound confirmed a patent left internal jugular vein. The left chest and neck were cleaned with a skin antiseptic and a sterile drape was placed. Maximal barrier sterile technique was utilized including caps, mask, sterile gowns, sterile gloves, sterile drape, hand hygiene and skin antiseptic. The left neck was anesthetized with 1% lidocaine. Small incision was made in the left neck with a blade. Micropuncture set  was placed in the left IJ with ultrasound guidance. The left chest was anesthetized with 1% lidocaine with epinephrine. #15 blade was used to make an incision and a subcutaneous port pocket was formed. [place] was selected. Subcutaneous tunnel was formed with a stiff tunneling device. The port catheter was brought through the subcutaneous tunnel. The micropuncture set was exchanged for a peel-away sheath. The catheter was placed through the peel-away sheath and the tip was positioned at the superior cavoatrial junction. Catheter placement was confirmed with fluoroscopy. The port was assembled and placed within the subcutaneous pocket. The port was accessed and flushed with heparinized saline. The port pocket was closed using two layers of absorbable sutures and Dermabond. The vein skin site was closed using a single layer of absorbable suture and Dermabond. Sterile dressings were applied. Patient tolerated the procedure well without an immediate complication. Ultrasound and fluoroscopic images were taken and saved for this procedure. IMPRESSION: Placement of a subcutaneous port device. Electronically Signed   By: Markus Daft M.D.   On: 11/25/2016 15:53   Dg Chest Port 1 View  Result Date: 12/19/2016 CLINICAL DATA:  Shortness of breath, weakness and anemia. History of ovarian carcinoma with peritoneal carcinomatosis and ascites. EXAM: PORTABLE CHEST 1 VIEW COMPARISON:  11/10/2016 FINDINGS: The heart size is normal. There is stable tortuosity of the thoracic aorta. Port-A-Cath present with catheter tip in the SVC. There is some elevation of the right hemidiaphragm with some associated atelectasis at the right lung base. Based on appearance, a component of subpulmonic pleural fluid and/or ascites causing elevation of the right hemidiaphragm may be present. There is known peritoneal carcinomatosis and ascites by prior imaging. IMPRESSION: Elevated right hemidiaphragm. Based on chest x-ray appearance, a  component of subpulmonic pleural fluid and/or ascites under the right hemidiaphragm may be present. Electronically Signed   By: Aletta Edouard M.D.   On: 12/19/2016 18:31   Ir Fluoro Guide Port Insertion Left  Result Date: 11/25/2016 INDICATION: 71 year old with peritoneal carcinomatosis. Port-A-Cath needed for treatment. The patient's white blood cell count is markedly low and abnormal. This was discussed with the patient's oncologist, Dr. Alvy Bimler, who felt the patient was safe for Port-A-Cath placement. EXAM: FLUOROSCOPIC AND ULTRASOUND GUIDED PLACEMENT OF A SUBCUTANEOUS PORT. Physician: Stephan Minister. Anselm Pancoast, MD MEDICATIONS: Cleocin 600 mg administered intravenously within one hour of incision. ANESTHESIA/SEDATION: Versed 1.0 mg IV; Fentanyl 25 mcg IV; Moderate Sedation Time:  40 minutes The patient was continuously monitored during the procedure by the interventional radiology nurse under my direct supervision. FLUOROSCOPY TIME:  54 seconds, 7 mGy COMPLICATIONS: None immediate. PROCEDURE: The risks of the procedure were explained to the patient. Informed consent was obtained. Patient was placed supine on the interventional table. Ultrasound confirmed a patent left internal jugular vein. The left chest and neck were cleaned with a  skin antiseptic and a sterile drape was placed. Maximal barrier sterile technique was utilized including caps, mask, sterile gowns, sterile gloves, sterile drape, hand hygiene and skin antiseptic. The left neck was anesthetized with 1% lidocaine. Small incision was made in the left neck with a blade. Micropuncture set was placed in the left IJ with ultrasound guidance. The left chest was anesthetized with 1% lidocaine with epinephrine. #15 blade was used to make an incision and a subcutaneous port pocket was formed. [place] was selected. Subcutaneous tunnel was formed with a stiff tunneling device. The port catheter was brought through the subcutaneous tunnel. The micropuncture  set was exchanged for a peel-away sheath. The catheter was placed through the peel-away sheath and the tip was positioned at the superior cavoatrial junction. Catheter placement was confirmed with fluoroscopy. The port was assembled and placed within the subcutaneous pocket. The port was accessed and flushed with heparinized saline. The port pocket was closed using two layers of absorbable sutures and Dermabond. The vein skin site was closed using a single layer of absorbable suture and Dermabond. Sterile dressings were applied. Patient tolerated the procedure well without an immediate complication. Ultrasound and fluoroscopic images were taken and saved for this procedure. IMPRESSION: Placement of a subcutaneous port device. Electronically Signed   By: Markus Daft M.D.   On: 11/25/2016 15:53      Subjective: Sleepy, denies pain.  Discharge Exam: Vitals:   12/21/16 1000 12/22/16 0601  BP: 120/70 118/80  Pulse: 96 88  Resp: 13 15  Temp:  97.6 F (36.4 C)   Vitals:   12/21/16 0600 12/21/16 0800 12/21/16 1000 12/22/16 0601  BP: 109/62 (!) 97/59 120/70 118/80  Pulse: (!) 102 83 96 88  Resp: _0 Temp:  98.2 F (36.8 C)  97.6 F (36.4 C)  TempSrc:  Oral  Axillary  SpO2: 96% 97% 99% 99%  Weight:      Height:        General: PT is sleepy, wake up and say few words.  Cardiovascular: RRR, S1/S2 +, no rubs.  Respiratory: bilateral ronchus Abdominal: Soft, NT, ND, bowel sounds + Extremities: no edema, no cyanosis    The results of significant diagnostics from this hospitalization (including imaging, microbiology, ancillary and laboratory) are listed below for reference.     Microbiology: Recent Results (from the past 240 hour(s))  Blood culture (routine x 2)     Status: None (Preliminary result)   Collection Time: 12/19/16  4:53 PM  Result Value Ref Range Status   Specimen Description BLOOD LEFT ANTECUBITAL  Final   Special Requests   Final    BOTTLES DRAWN AEROBIC ONLY  Blood Culture adequate volume   Culture   Final    NO GROWTH 2 DAYS Performed at [facility], [address], [place], [place] [address]    Report Status PENDING  Incomplete  Blood culture (routine x 2)     Status: None (Preliminary result)   Collection Time: 12/19/16  4:53 PM  Result Value Ref Range Status   Specimen Description BLOOD LEFT WRIST  Final   Special Requests   Final    BOTTLES DRAWN AEROBIC ONLY Blood Culture adequate volume   Culture   Final    NO GROWTH 2 DAYS Performed at [facility], [place] [address], [place], [place] [address]    Report Status PENDING  Incomplete  MRSA PCR Screening     Status: None  Collection Time: 12/19/16  5:40 PM  Result Value Ref Range Status   MRSA by PCR NEGATIVE NEGATIVE Final    Comment:        The GeneXpert MRSA Assay (FDA approved for NASAL specimens only), is one component of a comprehensive MRSA colonization surveillance program. It is not intended to diagnose MRSA infection nor to guide or monitor treatment for MRSA infections.      Labs: BNP (last 3 results) No results for input(s): BNP in the last 8760 hours. Basic Metabolic Panel:  Recent Labs Lab 12/19/16 1228 12/20/16 0525 12/21/16 0407  NA 134* 138 139  K 2.8* 3.5 3.4*  CL 97* 104 108  CO2 _0 GLUCOSE 97 97 82  BUN _1 CREATININE 0.89 0.79 0.98  CALCIUM 8.0* 7.7* 8.0*   Liver Function Tests:  Recent Labs Lab 12/19/16 1228 12/20/16 0525  AST 30 27  ALT 28 23  ALKPHOS 119 114  BILITOT 0.6 0.6  PROT 6.0* 4.9*  ALBUMIN 2.9* 2.4*   No results for input(s): LIPASE, AMYLASE in the last 168 hours.  Recent Labs Lab 12/19/16 1653  AMMONIA 20   CBC:  Recent Labs Lab 12/19/16 1228 12/20/16 0525 12/21/16 0407  WBC 15.0* 13.5* 11.6*  NEUTROABS 12.2* 11.5*  --   HGB 8.4* 8.0* 8.1*  HCT 26.2* 24.7* 24.6*  MCV 92.9 95.0 94.3  PLT 152 141* 130*   Cardiac Enzymes: No results for input(s): CKTOTAL, CKMB, CKMBINDEX,  TROPONINI in the last 168 hours. BNP: Invalid input(s): POCBNP CBG:  Recent Labs Lab 12/20/16 2024 12/20/16 2347 12/21/16 0346 12/21/16 0810 12/21/16 1212  GLUCAP 83 90 73 90 87   D-Dimer No results for input(s): DDIMER in the last 72 hours. Hgb A1c  Recent Labs  12/21/16 0407  HGBA1C 5.7*   Lipid Profile  Recent Labs  12/21/16 0407  CHOL 116  HDL 20*  LDLCALC 67  TRIG 147  CHOLHDL 5.8   Thyroid function studies No results for input(s): TSH, T4TOTAL, T3FREE, THYROIDAB in the last 72 hours.  Invalid input(s): FREET3 Anemia work up No results for input(s): VITAMINB12, FOLATE, FERRITIN, TIBC, IRON, RETICCTPCT in the last 72 hours. Urinalysis    Component Value Date/Time   COLORURINE YELLOW 12/02/2016 1643   APPEARANCEUR HAZY (A) 12/02/2016 1643   LABSPEC 1.028 12/02/2016 1643   PHURINE 5.0 12/02/2016 1643   GLUCOSEU 50 (A) 12/02/2016 1643   HGBUR SMALL (A) 12/02/2016 1643   BILIRUBINUR NEGATIVE 12/02/2016 1643   BILIRUBINUR negative 01/27/2016 1759   BILIRUBINUR neg 04/08/2012 1424   KETONESUR 20 (A) 12/02/2016 1643   PROTEINUR 30 (A) 12/02/2016 1643   UROBILINOGEN 0.2 01/27/2016 1759   UROBILINOGEN 1.0 01/06/2011 0008   NITRITE NEGATIVE 12/02/2016 1643   LEUKOCYTESUR NEGATIVE 12/02/2016 1643   Sepsis Labs Invalid input(s): PROCALCITONIN,  WBC,  LACTICIDVEN Microbiology Recent Results (from the past 240 hour(s))  Blood culture (routine x 2)     Status: None (Preliminary result)   Collection Time: 12/19/16  4:53 PM  Result Value Ref Range Status   Specimen Description BLOOD LEFT ANTECUBITAL  Final   Special Requests   Final    BOTTLES DRAWN AEROBIC ONLY Blood Culture adequate volume   Culture   Final    NO GROWTH 2 DAYS Performed at [facility], [place] [address] [place], [place] [address]    Report Status PENDING  Incomplete  Blood culture (routine x 2)     Status: None (  Preliminary result)   Collection Time: 12/19/16  4:53 PM  Result  Value Ref Range Status   Specimen Description BLOOD LEFT WRIST  Final   Special Requests   Final    BOTTLES DRAWN AEROBIC ONLY Blood Culture adequate volume   Culture   Final    NO GROWTH 2 DAYS Performed at [facility], [address] [place], [place] [address]    Report Status PENDING  Incomplete  MRSA PCR Screening     Status: None   Collection Time: 12/19/16  5:40 PM  Result Value Ref Range Status   MRSA by PCR NEGATIVE NEGATIVE Final    Comment:        The GeneXpert MRSA Assay (FDA approved for NASAL specimens only), is one component of a comprehensive MRSA colonization surveillance program. It is not intended to diagnose MRSA infection nor to guide or monitor treatment for MRSA infections.      Time coordinating discharge: Over 30 minutes  SIGNED:   Elmarie Shiley, MD  [facility] 12/22/2016, 12:56 PM Pager [phone]  If 7PM-7AM, please contact night-coverage [email or web address] Password [email or web address]

## 2016-12-22 NOTE — Progress Notes (Signed)
Daily Progress Note   Patient Name: Annette Farmer       Date: 12/22/2016 DOB: Sep 18, 1945  Age: 71 y.o. MRN#: [id number] Attending Physician: Elmarie Shiley, MD Primary Care Physician: Wardell Honour, MD Admit Date: 12/19/2016  Reason for Consultation/Follow-up: Non pain symptom management, Psychosocial/spiritual support and Terminal Care  Subjective: [name] was lethargic but verbally interactive this morning. She was in a very bad mood today, which stemmed from wanting breakfast immediately. She would not answer any other questions, and followed commands very begrudgingly and only when required for safety. No family or friends at the bedside.   Length of Stay: 3  Current Medications: Scheduled Meds:  . Chlorhexidine Gluconate Cloth  6 each Topical Daily  . famotidine  20 mg Oral Daily  . gabapentin  300 mg Oral QHS  . levETIRAcetam  750 mg Oral BID  . LORazepam  0.5 mg Oral BID  . metoCLOPramide  5 mg Oral TID AC  . mirtazapine  15 mg Oral QHS  . senna-docusate  2 tablet Oral BID  . sodium chloride flush  3 mL Intravenous Q12H    Continuous Infusions: . sodium chloride 100 mL/hr at 12/21/16 1145  . sodium chloride      PRN Meds: acetaminophen **OR** acetaminophen, albuterol, alum & mag hydroxide-simeth, antiseptic oral rinse, glycopyrrolate **OR** glycopyrrolate **OR** glycopyrrolate, guaiFENesin-dextromethorphan, haloperidol **OR** haloperidol **OR** haloperidol lactate, HYDROcodone-acetaminophen, LORazepam **OR** LORazepam **OR** LORazepam, LORazepam, morphine CONCENTRATE **OR** morphine CONCENTRATE, ondansetron **OR** ondansetron (ZOFRAN) IV, polyvinyl alcohol  Physical Exam  Constitutional:  Frail chronically ill appearing older woman  HENT:  Head: Normocephalic and atraumatic.  Mouth/Throat: Oropharynx is clear  and moist. Mucous membranes are dry. Abnormal dentition.  Pulmonary/Chest: Effort normal. No respiratory distress.  Musculoskeletal:  Generalized weakness, but moving self in bed.  Neurological:  Lethargic. Difficult to assess neuro status as she will not answer questions. She does follow simple commands when repeated. She also does respond to statements, but is very angry and frustrated.  Skin: Skin is warm and dry. There is pallor.  Psychiatric: Her mood appears anxious. Her affect is angry and blunt. Her speech is slurred. She is agitated.           Vital Signs: BP 118/80 (BP Location: Right Arm)   Pulse 88   Temp 97.6 F (36.4 C) (Axillary)   Resp 15   Ht 5' 1.5" (1.562 m)   Wt 70.7 kg (155 lb 13.8 oz)   SpO2 99%   BMI 28.97 kg/m  SpO2: SpO2: 99 % O2 Device: O2 Device: Not Delivered O2 Flow Rate:    Intake/output summary:  Intake/Output Summary (Last 24 hours) at 12/22/16 0929 Last data filed at 12/21/16 2151  Gross per 24 hour  Intake              675 ml  Output              202 ml  Net              473 ml   LBM: Last BM Date: 12/21/16 Baseline Weight: Weight: 66.6 kg (146 lb 13.2 oz) Most recent weight: Weight: 70.7 kg (155 lb 13.8 oz)  Flowsheet  Rows     Most Recent Value  Intake Tab  Referral Department  Hospitalist  Unit at Time of Referral  Intermediate Care Unit  Palliative Care Primary Diagnosis  Cancer  Date Notified  12/20/16  Palliative Care Type  Return patient Palliative Care  Reason for referral  Clarify Goals of Care  Date of Admission  12/19/16  Date first seen by Palliative Care  12/20/16  # of days Palliative referral response time  0 Day(s)  # of days IP prior to Palliative referral  1  Clinical Assessment  Palliative Performance Scale Score  30%  Psychosocial & Spiritual Assessment  Palliative Care Outcomes  Patient/Family meeting held?  Yes  Who was at the meeting?  patient  Palliative Care Outcomes  Clarified goals of care       Patient Active Problem List   Diagnosis Date Noted  . Encounter for hospice care discussion   . Hypokalemia 12/19/2016  . SIRS (systemic inflammatory response syndrome) ([place]) 12/19/2016  . Focal seizures ([place]) 12/19/2016  . Normocytic anemia 12/19/2016  . Hypotension 12/19/2016  . Pressure injury of skin 12/19/2016  . Focal seizure ([place])   . Failure to thrive in adult 12/13/2016  . Peritonitis ([place]) 12/02/2016  . Peritoneal carcinomatosis ([place])   . Ovarian cancer on right [facility]): Stage 3C 11/16/2016  . Ileus ([place]) 11/16/2016  . Acute thromboembolism of deep veins of right lower extremity (HCC)   . Port-a-cath in place   . DNR (do not resuscitate)   . Metastatic cancer ([place])   . Palliative care encounter   . Goals of care, counseling/discussion   . Bowel obstruction ([place])   . Severe protein-calorie malnutrition ([place])   . AKI (acute kidney injury) ([place]) 11/11/2016  . Pelvic mass 11/11/2016  . Carcinomatosis ([place]) 11/11/2016  . DVT (deep venous thrombosis) ([place]) 11/10/2016  . Refusal of statin medication by patient 09/28/2016  . Hemiparesis affecting right side as late effect of cerebrovascular accident ([place]) 04/23/2012  . Type 2 diabetes mellitus ([place]) 07/20/2006  . HYPERCHOLESTEROLEMIA 07/20/2006  . OBESITY, NOS 07/20/2006  . HYPERTENSION, BENIGN SYSTEMIC 07/20/2006  . HEMORRHOIDS, NOS 07/20/2006  . CONSTIPATION 07/20/2006  . INCONTINENCE, STRESS, FEMALE 07/20/2006  . MENOPAUSAL SYNDROME 07/20/2006    Palliative Care Assessment & Plan   HPI: 71 y.o.femalewith past medical history of stroke, metastatic ovarian cancer with carcinomatosis, partial bowel obs (thought to be related to metastatic cancer), DM, depressionwho was admitted on 7/30/2018with focal seizures, anemia, and CVA. Per EPIC notes she was sent to the ER from [facility] for abnormal labs. It appears her hgb has dropped from 11-12 in early July 2018 to 8-9 range. In transport the patient was noted  to have left sided facial and upper extremity twitching. MR brain at the time of admission showed a possible early infarct in the left parietal and left occipital lobes.  Assessment: Palliative has been working with [name] and her family. PA Dellinger spoke with the patient's HCPOA, brother, and friend privately. The patient's medical issues were reviewed and advanced directive discussed. MOST form completed:  1. DNR 2.  Comfort Measures Only.  Do not return to the hospital unless she can not be made comfortable in her current location 3.  Antibiotics.  For comfort.  Determine at time of infection 4.  No IVF 5.  No Feeding Tube  After discussing locations for support, it was decided that a residential hospice facility would provide the best supportive care for her, given her  rapid decline. [facility] selected and plan to transition her there today.   I followed up today to provider terminal care and ensure adequate symptom management. Ms. Vanderheyden was very angry and agitated this morning on my arrival. This stemmed from wanting breakfast. I noted no diet order in place, and reordered her prior one. I also discussed with the care nurse ordering breakfast and monitoring for safe oral intake. Once this was taken care of Ms. Drahos stopped yelling, but remained angry when spoken to and dismissed me from the room. Once out of the room she did appear more calm and fell asleep.    Recommendations/Plan:  DNR, full comfort care, plan to transition to residential hospice today  Goals of Care and Additional Recommendations:  Limitations on Scope of Treatment: Full Comfort Care  Code Status:  DNR  Prognosis:   Days to weeks in the setting of rapid functional decline in a patient with metastatic ovarian cancer with carcinomatosis, acute anemia from uncertain source, acute seizures, acute stroke.  Patient only wants comfort measures.  HCPOA agreeable.  Discharge Planning:  Hospice  facility  Care plan was discussed with pt and care nurse.  Thank you for allowing the Palliative Medicine Team to assist in the care of this patient.  Total time: 15 minutes    Greater than 50%  of this time was spent counseling and coordinating care related to the above assessment and plan.  Charlynn Court, NP Palliative Medicine Team [phone] pager (7a-5p) Team Phone # [phone]

## 2016-12-24 LAB — CULTURE, BLOOD (ROUTINE X 2)
CULTURE: NO GROWTH
Culture: NO GROWTH
SPECIAL REQUESTS: ADEQUATE
Special Requests: ADEQUATE

## 2017-01-21 DEATH — deceased

## 2017-08-18 ENCOUNTER — Other Ambulatory Visit: Payer: Self-pay | Admitting: Nurse Practitioner

## 2018-09-03 IMAGING — DX DG HIP (WITH OR WITHOUT PELVIS) 2-3V*R*
3 series · 3 of 3 positions shown · non-contrast
Comparison: Abdomen films from 12/24/2003.

CLINICAL DATA: Patient fell 6 days ago. Swollen right leg with hip
pain.

EXAM:
DG HIP (WITH OR WITHOUT PELVIS) 2-3V RIGHT

[pelvis ap]
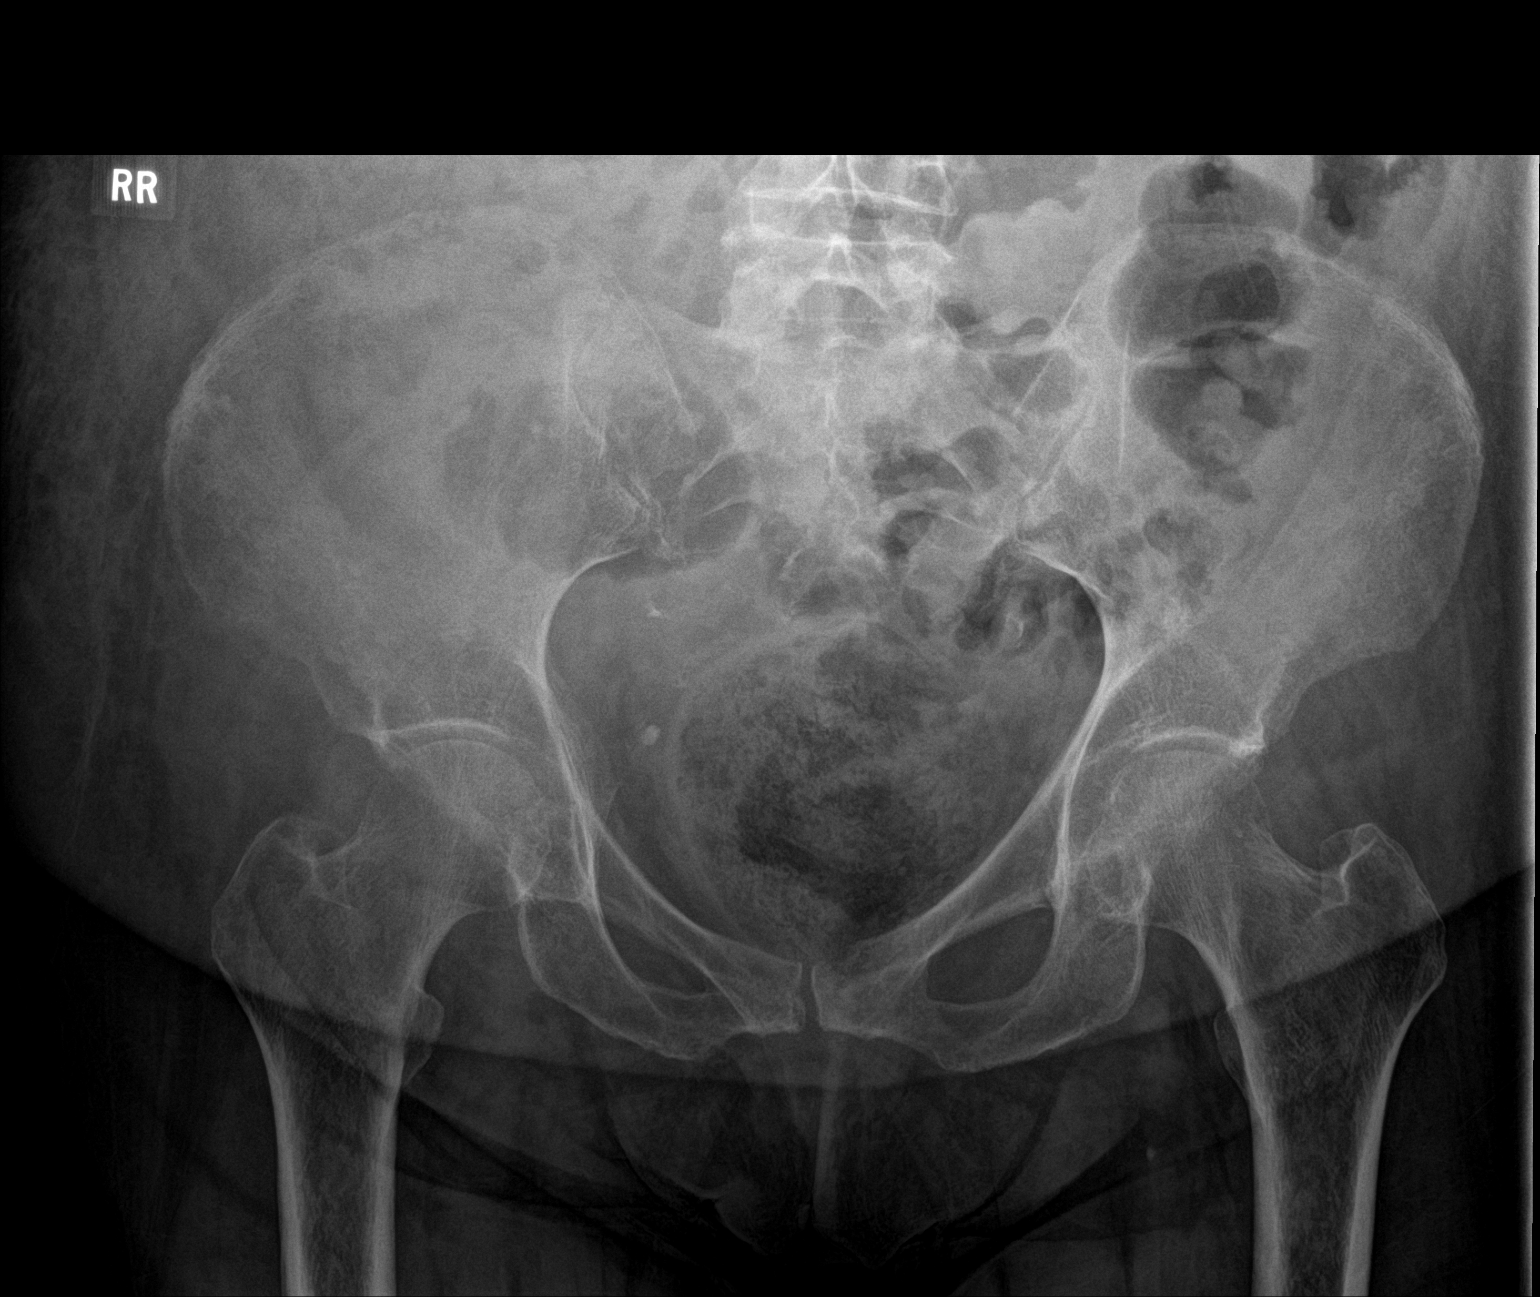

[hip ap]
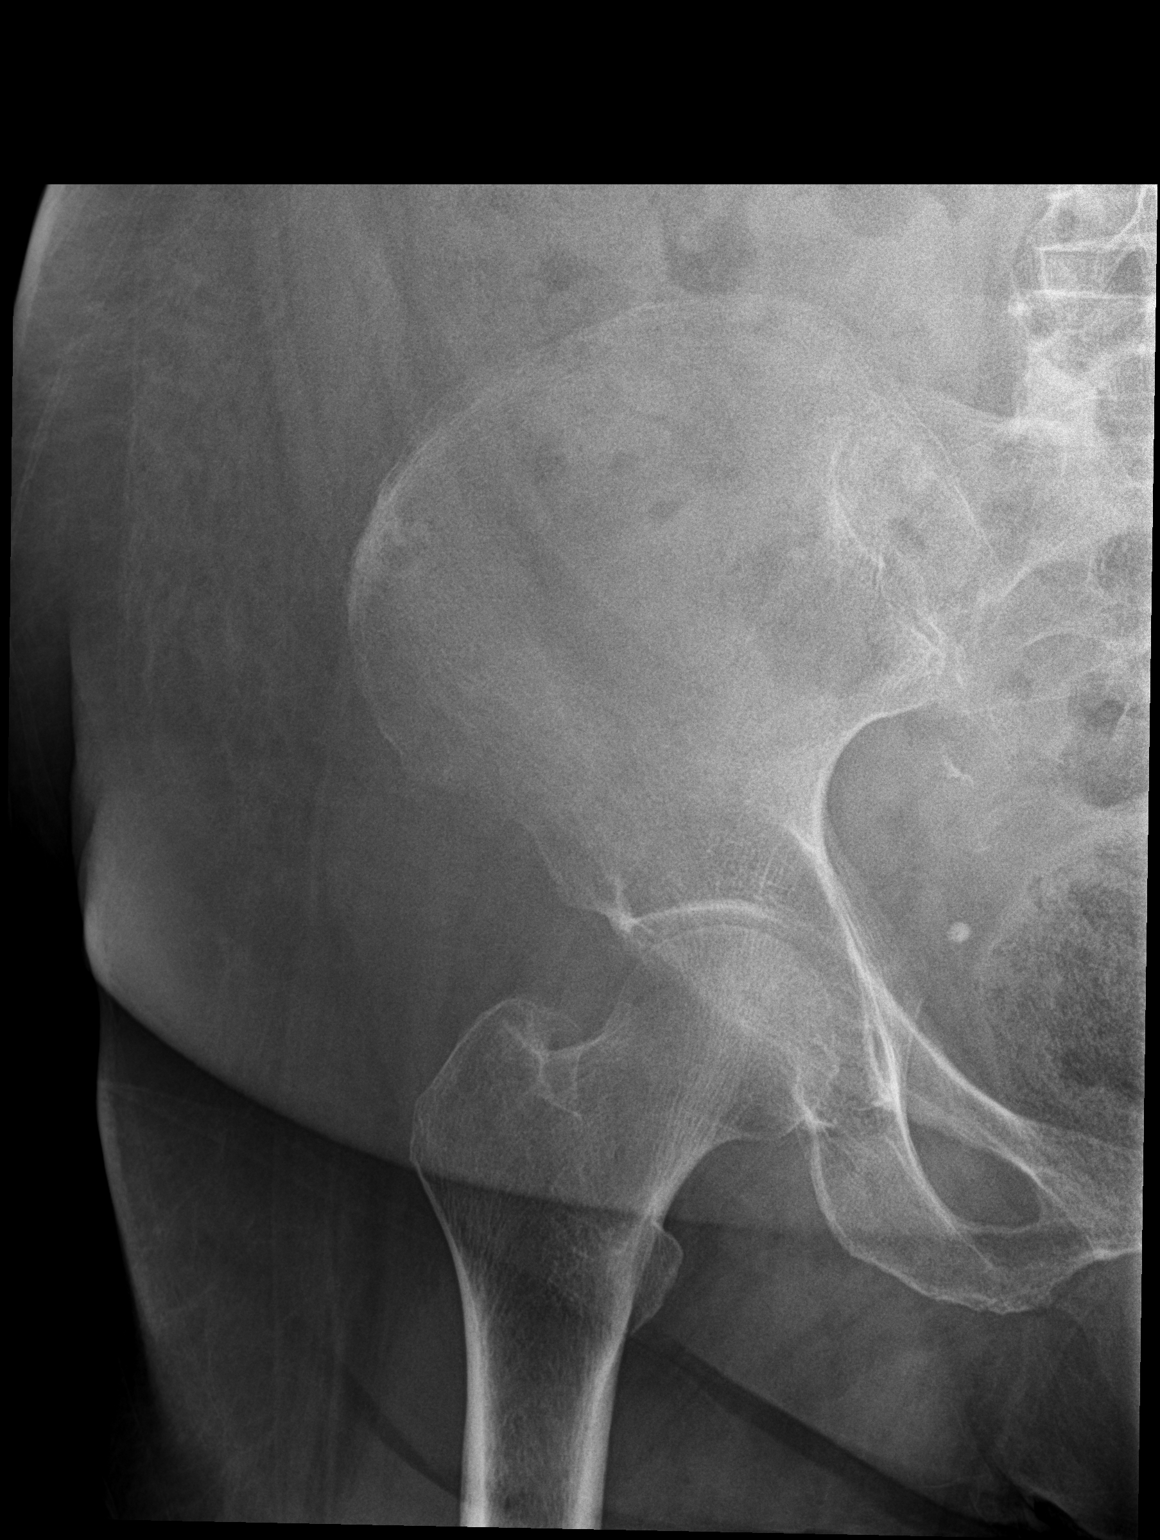

[hip lat]
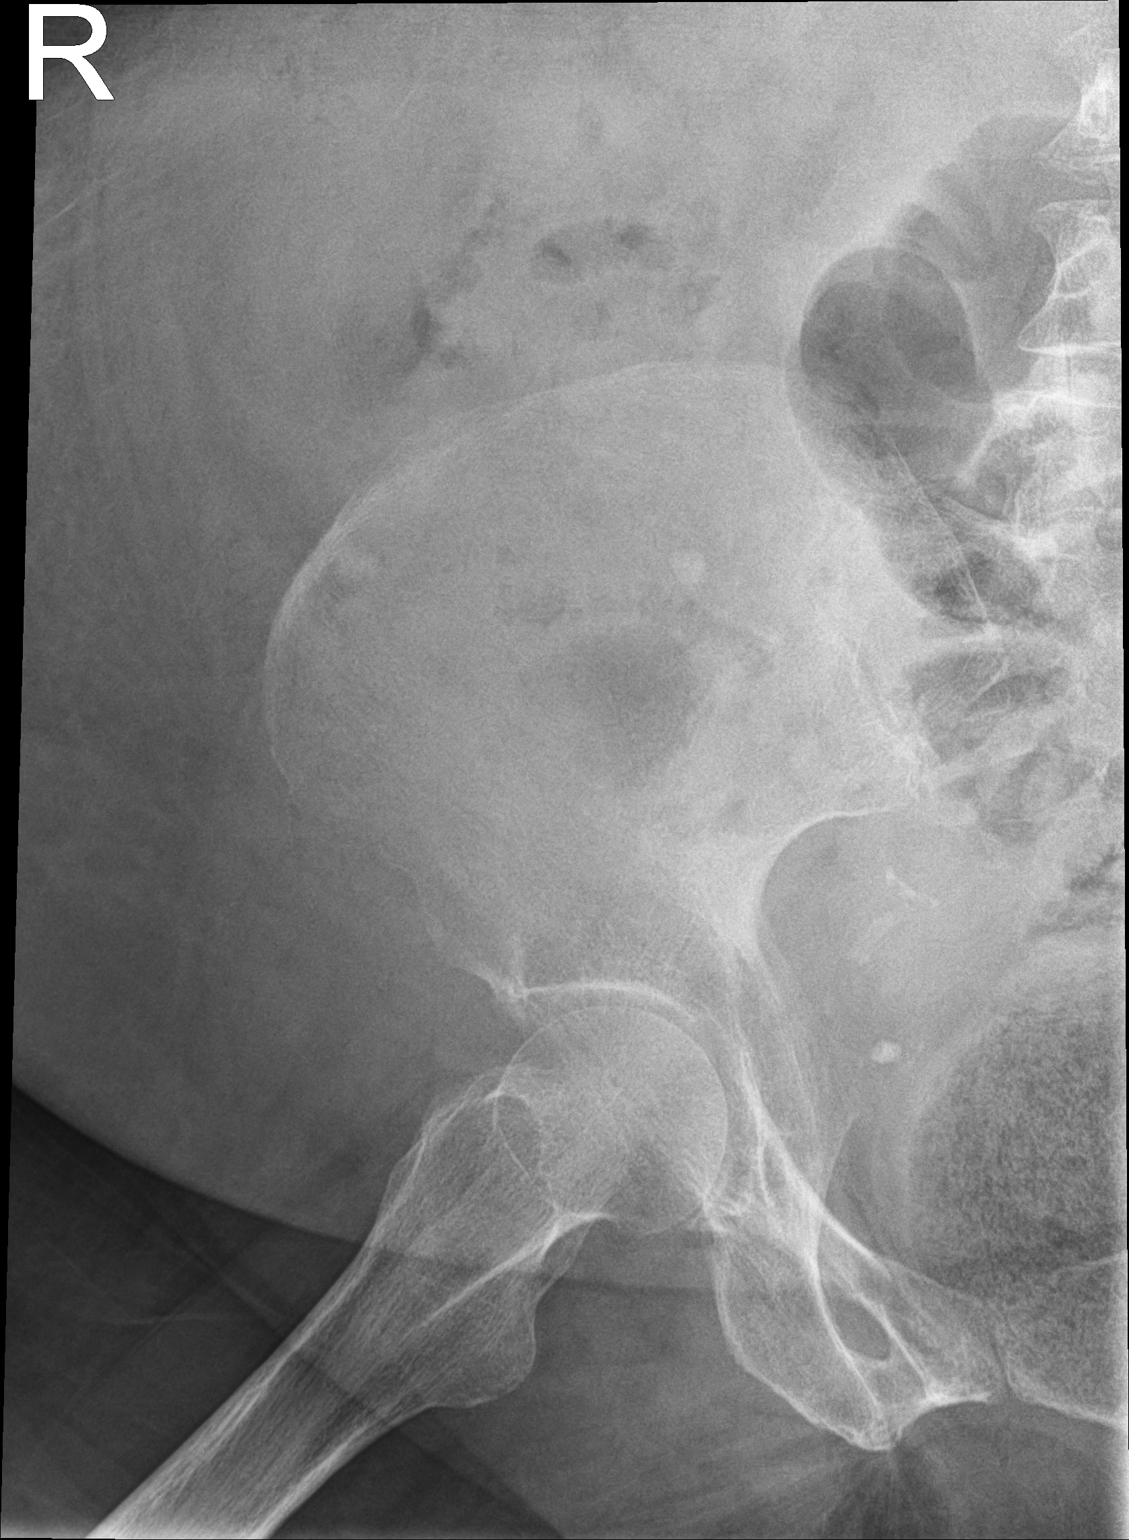

[3 of 3 positions shown; findings below may reference images not displayed]

FINDINGS: There is no evidence of hip fracture or dislocation. There is no
evidence of arthropathy or other focal bone abnormality.
IMPRESSION: Negative.

## 2018-09-10 IMAGING — CR DG CHEST 2V
2 series · 2 of 2 positions shown · non-contrast
Comparison: Chest radiograph 08/30/2015

CLINICAL DATA: Chest pain and right lower extremity DVT

EXAM:
CHEST  2 VIEW

[chest lat]
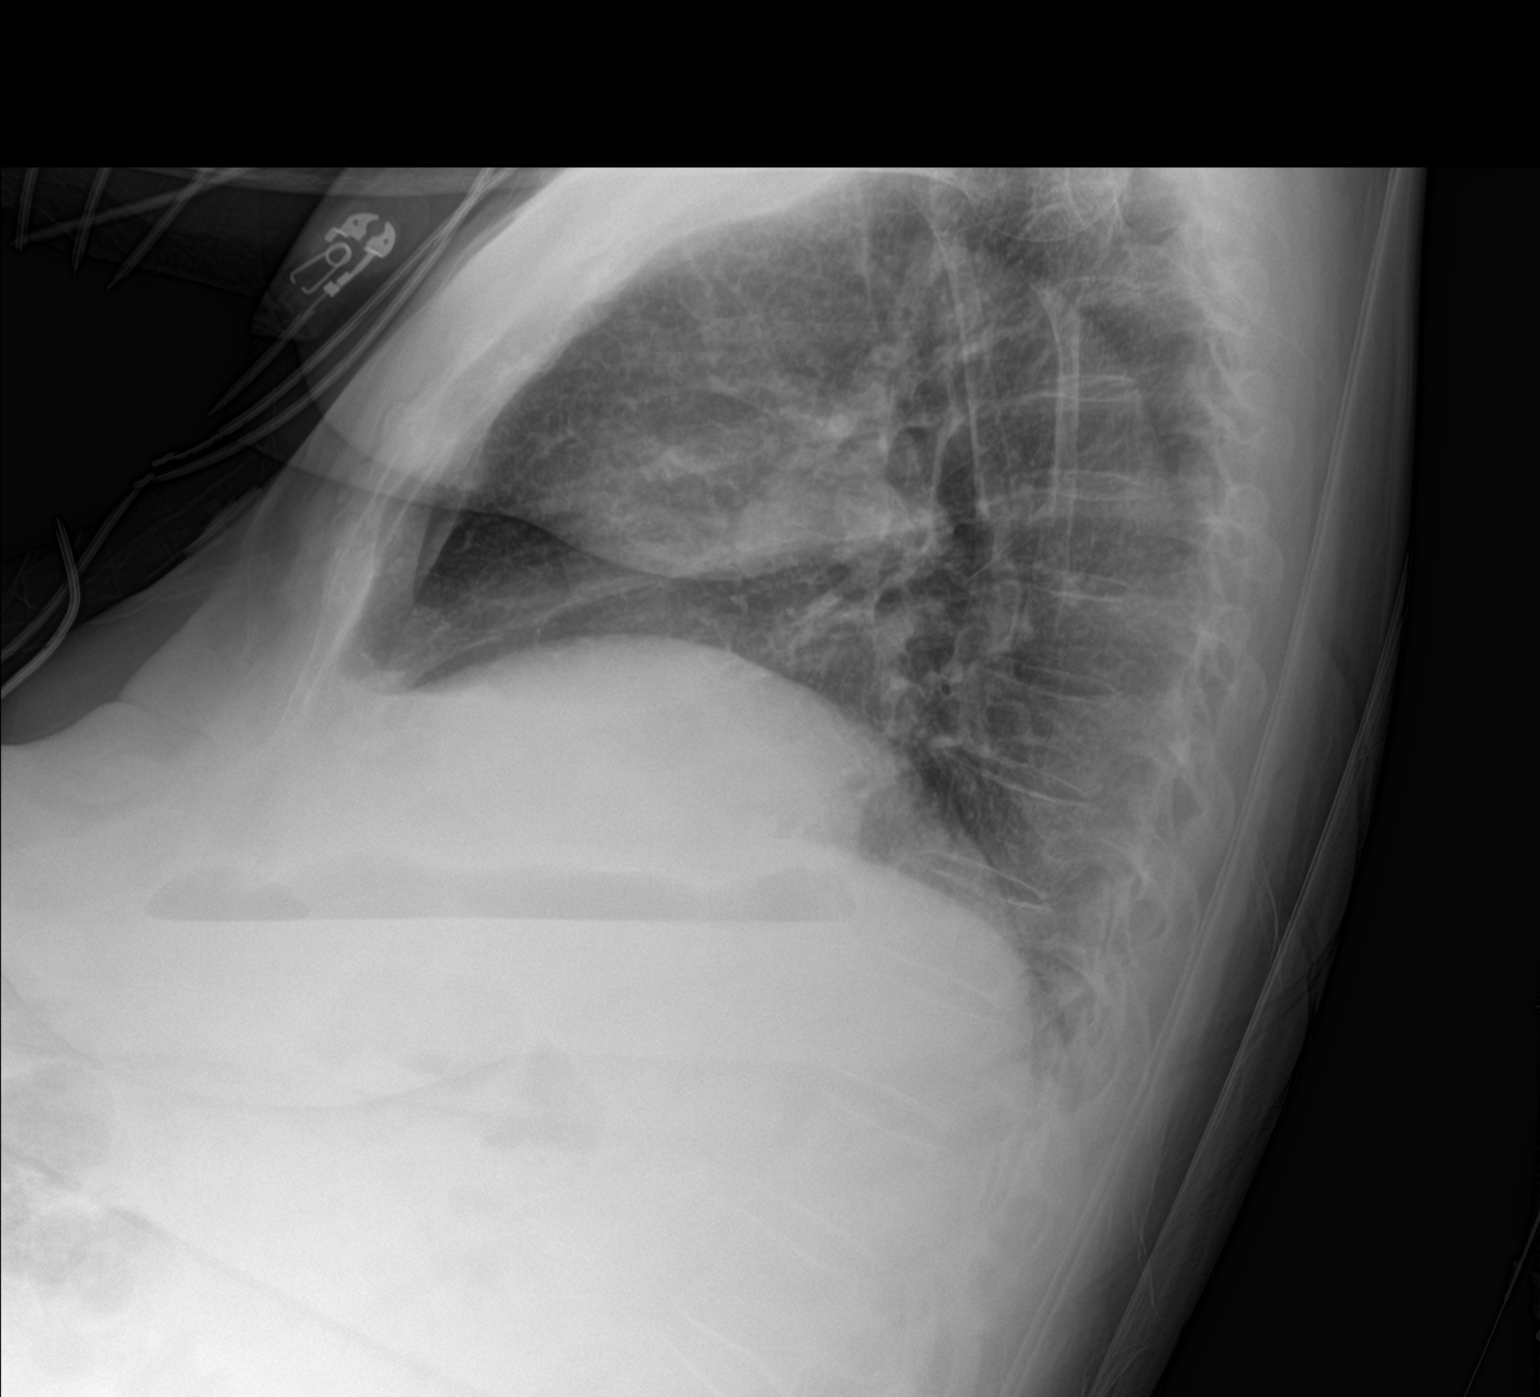

[chest ap]
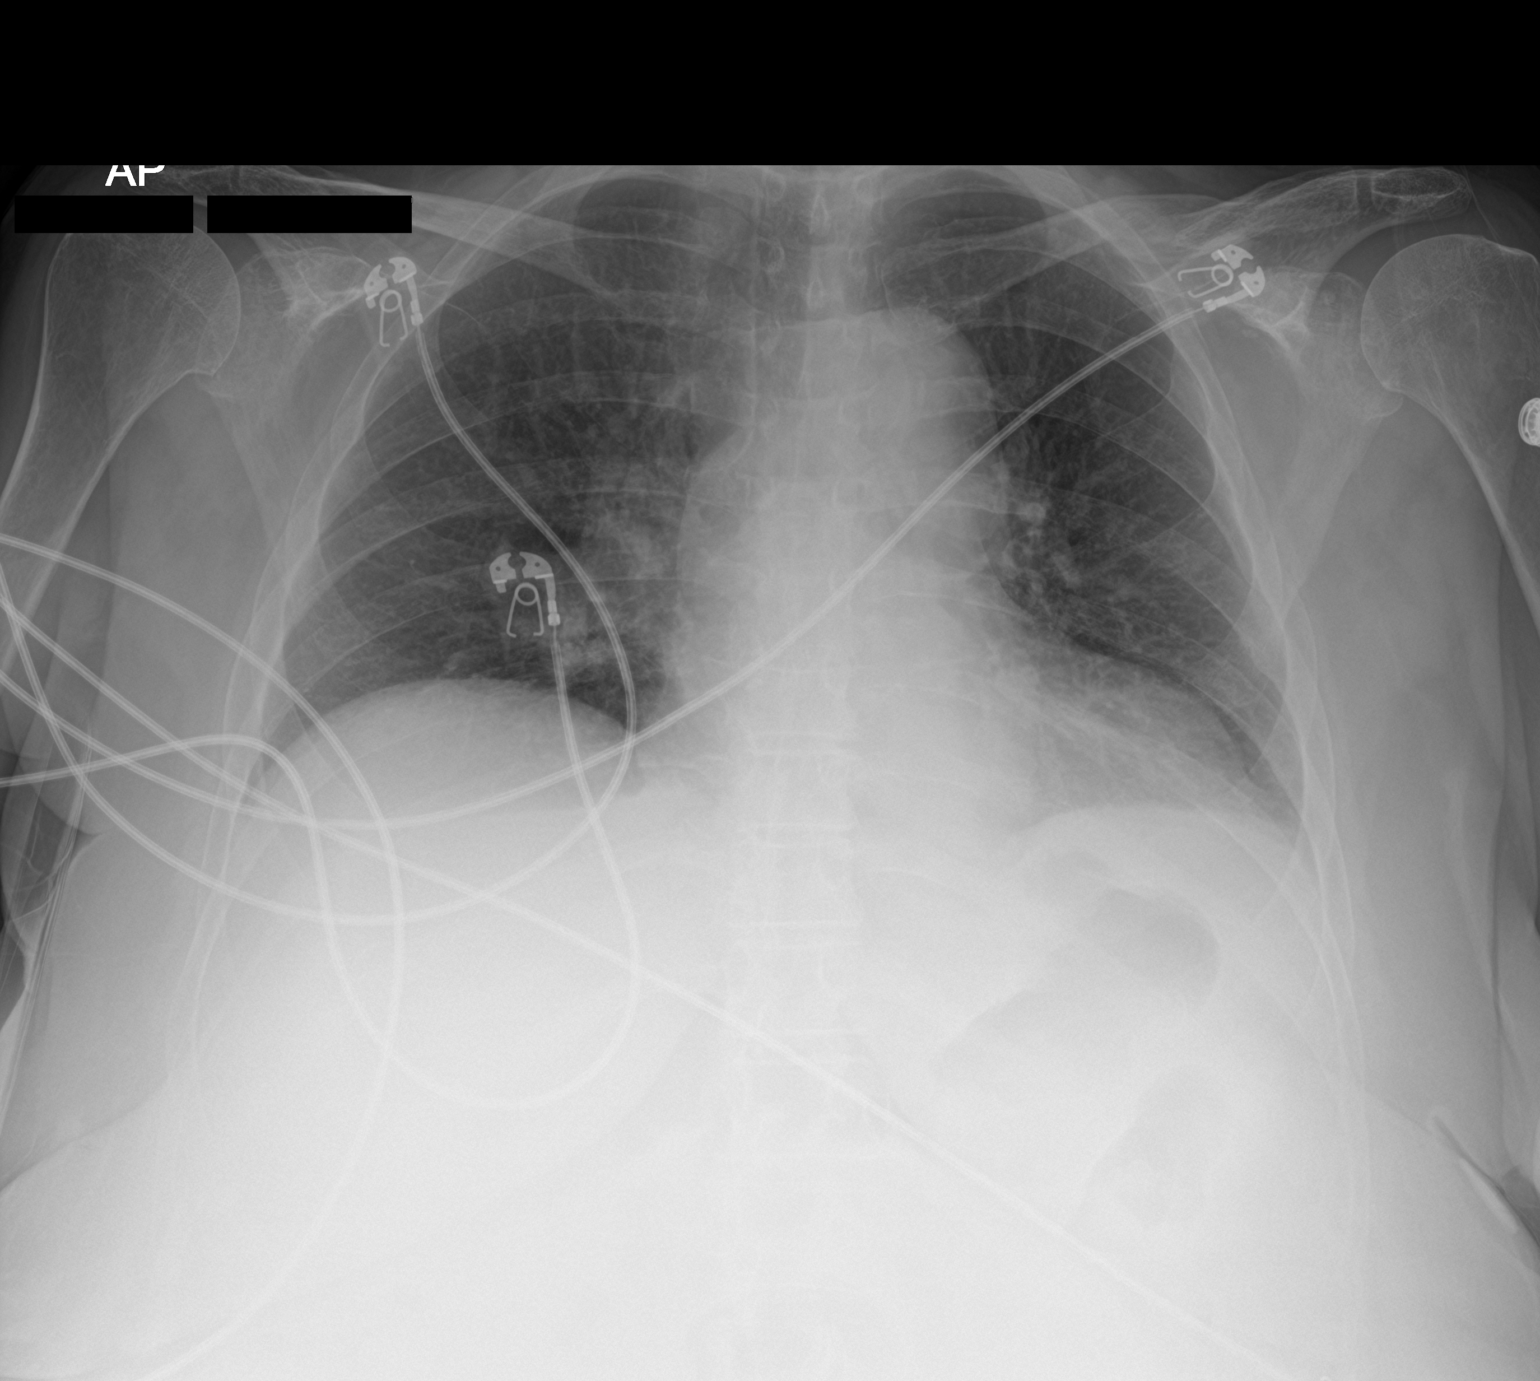

[2 of 2 positions shown; findings below may reference images not displayed]

FINDINGS: Shallow lung inflation without focal airspace disease or pulmonary
edema. No pleural effusion or pneumothorax. Unchanged
cardiomediastinal contours.
IMPRESSION: No active cardiopulmonary disease.
